# Patient Record
Sex: Female | Born: 1940 | Race: White | Hispanic: No | Marital: Married | Smoking: Former smoker
Health system: Southern US, Community
[De-identification: ages and names within clinical notes are randomized; demographics above are authoritative.]

## PROBLEM LIST (undated history)

## (undated) DIAGNOSIS — E041 Nontoxic single thyroid nodule: Secondary | ICD-10-CM

## (undated) DIAGNOSIS — E7800 Pure hypercholesterolemia, unspecified: Secondary | ICD-10-CM

## (undated) DIAGNOSIS — E669 Obesity, unspecified: Secondary | ICD-10-CM

## (undated) DIAGNOSIS — I2109 ST elevation (STEMI) myocardial infarction involving other coronary artery of anterior wall: Secondary | ICD-10-CM

## (undated) DIAGNOSIS — F32A Depression, unspecified: Secondary | ICD-10-CM

## (undated) DIAGNOSIS — I10 Essential (primary) hypertension: Secondary | ICD-10-CM

## (undated) DIAGNOSIS — I480 Paroxysmal atrial fibrillation: Secondary | ICD-10-CM

## (undated) DIAGNOSIS — F329 Major depressive disorder, single episode, unspecified: Secondary | ICD-10-CM

## (undated) DIAGNOSIS — I25810 Atherosclerosis of coronary artery bypass graft(s) without angina pectoris: Secondary | ICD-10-CM

## (undated) DIAGNOSIS — J984 Other disorders of lung: Secondary | ICD-10-CM

## (undated) DIAGNOSIS — Z950 Presence of cardiac pacemaker: Secondary | ICD-10-CM

## (undated) DIAGNOSIS — M479 Spondylosis, unspecified: Secondary | ICD-10-CM

## (undated) HISTORY — DX: Essential (primary) hypertension: I10

## (undated) HISTORY — DX: Spondylosis, unspecified: M47.9

## (undated) HISTORY — PX: CHOLECYSTECTOMY: SHX55

## (undated) HISTORY — DX: Paroxysmal atrial fibrillation: I48.0

## (undated) HISTORY — DX: Pure hypercholesterolemia, unspecified: E78.00

## (undated) HISTORY — PX: APPENDECTOMY: SHX54

## (undated) HISTORY — DX: Presence of cardiac pacemaker: Z95.0

## (undated) HISTORY — DX: Other disorders of lung: J98.4

## (undated) HISTORY — PX: TONSILLECTOMY: SHX5217

## (undated) HISTORY — DX: Obesity, unspecified: E66.9

## (undated) HISTORY — DX: Atherosclerosis of coronary artery bypass graft(s) without angina pectoris: I25.810

## (undated) HISTORY — DX: ST elevation (STEMI) myocardial infarction involving other coronary artery of anterior wall: I21.09

## (undated) HISTORY — PX: ANGIOPLASTY: SHX39

## (undated) HISTORY — DX: Nontoxic single thyroid nodule: E04.1

## (undated) HISTORY — DX: Major depressive disorder, single episode, unspecified: F32.9

## (undated) HISTORY — DX: Depression, unspecified: F32.A

---

## 1965-11-02 HISTORY — PX: BREAST LUMPECTOMY: SHX2

## 2005-11-23 ENCOUNTER — Other Ambulatory Visit: Admission: RE | Admit: 2005-11-23 | Discharge: 2005-11-23 | Payer: Self-pay | Admitting: Family Medicine

## 2009-11-02 DIAGNOSIS — E041 Nontoxic single thyroid nodule: Secondary | ICD-10-CM

## 2009-11-02 DIAGNOSIS — I2109 ST elevation (STEMI) myocardial infarction involving other coronary artery of anterior wall: Secondary | ICD-10-CM

## 2009-11-02 DIAGNOSIS — I25810 Atherosclerosis of coronary artery bypass graft(s) without angina pectoris: Secondary | ICD-10-CM

## 2009-11-02 HISTORY — PX: CORONARY ARTERY BYPASS GRAFT: SHX141

## 2009-11-02 HISTORY — DX: Atherosclerosis of coronary artery bypass graft(s) without angina pectoris: I25.810

## 2009-11-02 HISTORY — DX: Nontoxic single thyroid nodule: E04.1

## 2009-11-02 HISTORY — DX: ST elevation (STEMI) myocardial infarction involving other coronary artery of anterior wall: I21.09

## 2009-11-03 ENCOUNTER — Ambulatory Visit: Payer: Self-pay | Admitting: Cardiology

## 2009-11-03 ENCOUNTER — Inpatient Hospital Stay (HOSPITAL_COMMUNITY): Admission: EM | Admit: 2009-11-03 | Discharge: 2009-11-14 | Payer: Self-pay | Admitting: Emergency Medicine

## 2009-11-03 ENCOUNTER — Encounter: Payer: Self-pay | Admitting: Cardiology

## 2009-11-04 ENCOUNTER — Encounter: Payer: Self-pay | Admitting: Cardiology

## 2009-11-05 ENCOUNTER — Encounter: Payer: Self-pay | Admitting: Thoracic Surgery (Cardiothoracic Vascular Surgery)

## 2009-11-05 ENCOUNTER — Encounter: Payer: Self-pay | Admitting: Cardiology

## 2009-11-05 ENCOUNTER — Ambulatory Visit: Payer: Self-pay | Admitting: Thoracic Surgery (Cardiothoracic Vascular Surgery)

## 2009-11-06 ENCOUNTER — Encounter: Payer: Self-pay | Admitting: Cardiology

## 2009-11-12 ENCOUNTER — Encounter: Payer: Self-pay | Admitting: Cardiology

## 2009-11-16 ENCOUNTER — Inpatient Hospital Stay (HOSPITAL_COMMUNITY): Admission: EM | Admit: 2009-11-16 | Discharge: 2009-11-17 | Payer: Self-pay | Admitting: Emergency Medicine

## 2009-11-16 ENCOUNTER — Ambulatory Visit: Payer: Self-pay | Admitting: Internal Medicine

## 2009-11-20 ENCOUNTER — Ambulatory Visit: Payer: Self-pay | Admitting: Cardiovascular Disease

## 2009-11-20 LAB — CONVERTED CEMR LAB: POC INR: 2.2

## 2009-11-25 ENCOUNTER — Ambulatory Visit: Payer: Self-pay | Admitting: Internal Medicine

## 2009-12-02 ENCOUNTER — Ambulatory Visit: Payer: Self-pay | Admitting: Thoracic Surgery (Cardiothoracic Vascular Surgery)

## 2009-12-02 ENCOUNTER — Encounter
Admission: RE | Admit: 2009-12-02 | Discharge: 2009-12-02 | Payer: Self-pay | Admitting: Thoracic Surgery (Cardiothoracic Vascular Surgery)

## 2009-12-02 ENCOUNTER — Ambulatory Visit: Payer: Self-pay | Admitting: Cardiology

## 2009-12-02 LAB — CONVERTED CEMR LAB: POC INR: 3

## 2009-12-05 DIAGNOSIS — I4891 Unspecified atrial fibrillation: Secondary | ICD-10-CM | POA: Insufficient documentation

## 2009-12-05 DIAGNOSIS — E669 Obesity, unspecified: Secondary | ICD-10-CM

## 2009-12-05 DIAGNOSIS — I2109 ST elevation (STEMI) myocardial infarction involving other coronary artery of anterior wall: Secondary | ICD-10-CM

## 2009-12-05 DIAGNOSIS — I10 Essential (primary) hypertension: Secondary | ICD-10-CM

## 2009-12-05 DIAGNOSIS — I25810 Atherosclerosis of coronary artery bypass graft(s) without angina pectoris: Secondary | ICD-10-CM | POA: Insufficient documentation

## 2009-12-06 ENCOUNTER — Telehealth: Payer: Self-pay | Admitting: Cardiology

## 2009-12-11 ENCOUNTER — Ambulatory Visit: Payer: Self-pay | Admitting: Cardiology

## 2009-12-11 ENCOUNTER — Encounter: Payer: Self-pay | Admitting: Cardiology

## 2009-12-15 LAB — CONVERTED CEMR LAB
BUN: 19 mg/dL (ref 6–23)
Basophils Relative: 0.7 % (ref 0.0–3.0)
Cholesterol: 124 mg/dL (ref 0–200)
Eosinophils Relative: 2.5 % (ref 0.0–5.0)
HCT: 37.7 % (ref 36.0–46.0)
HDL: 54.9 mg/dL (ref >39.00)
Hemoglobin: 12.4 g/dL (ref 12.0–15.0)
Lymphocytes Relative: 33.6 % (ref 12.0–46.0)
MCHC: 32.8 g/dL (ref 30.0–36.0)
MCV: 91.1 fL (ref 78.0–100.0)
Monocytes Absolute: 0.8 10*3/uL (ref 0.1–1.0)
Platelets: 308 10*3/uL (ref 150.0–400.0)
RBC: 4.13 M/uL (ref 3.87–5.11)
RDW: 13.1 % (ref 11.5–14.6)
Sodium: 138 meq/L (ref 135–145)
VLDL: 17.4 mg/dL (ref 0.0–40.0)

## 2009-12-16 ENCOUNTER — Encounter (INDEPENDENT_AMBULATORY_CARE_PROVIDER_SITE_OTHER): Payer: Self-pay

## 2009-12-26 ENCOUNTER — Ambulatory Visit: Payer: Self-pay | Admitting: Cardiology

## 2009-12-26 DIAGNOSIS — E7800 Pure hypercholesterolemia, unspecified: Secondary | ICD-10-CM

## 2010-01-02 ENCOUNTER — Encounter (HOSPITAL_COMMUNITY): Admission: RE | Admit: 2010-01-02 | Discharge: 2010-04-02 | Payer: Self-pay | Admitting: Cardiology

## 2010-01-05 ENCOUNTER — Ambulatory Visit: Payer: Self-pay | Admitting: Internal Medicine

## 2010-01-05 ENCOUNTER — Inpatient Hospital Stay (HOSPITAL_COMMUNITY): Admission: EM | Admit: 2010-01-05 | Discharge: 2010-01-06 | Payer: Self-pay | Admitting: Emergency Medicine

## 2010-01-06 ENCOUNTER — Encounter: Payer: Self-pay | Admitting: Internal Medicine

## 2010-02-03 ENCOUNTER — Ambulatory Visit: Payer: Self-pay | Admitting: Cardiology

## 2010-02-05 ENCOUNTER — Ambulatory Visit: Payer: Self-pay | Admitting: Cardiology

## 2010-02-05 DIAGNOSIS — J984 Other disorders of lung: Secondary | ICD-10-CM

## 2010-02-06 ENCOUNTER — Ambulatory Visit (HOSPITAL_COMMUNITY): Admission: RE | Admit: 2010-02-06 | Discharge: 2010-02-06 | Payer: Self-pay | Admitting: Cardiology

## 2010-02-18 ENCOUNTER — Encounter: Payer: Self-pay | Admitting: Cardiology

## 2010-02-20 ENCOUNTER — Ambulatory Visit: Payer: Self-pay | Admitting: Internal Medicine

## 2010-02-20 DIAGNOSIS — F329 Major depressive disorder, single episode, unspecified: Secondary | ICD-10-CM

## 2010-02-20 DIAGNOSIS — N390 Urinary tract infection, site not specified: Secondary | ICD-10-CM | POA: Insufficient documentation

## 2010-02-20 DIAGNOSIS — I2510 Atherosclerotic heart disease of native coronary artery without angina pectoris: Secondary | ICD-10-CM | POA: Insufficient documentation

## 2010-02-20 DIAGNOSIS — R509 Fever, unspecified: Secondary | ICD-10-CM

## 2010-02-20 LAB — CONVERTED CEMR LAB
Basophils Relative: 0.2 % (ref 0.0–3.0)
Eosinophils Absolute: 0 10*3/uL (ref 0.0–0.7)
HCT: 39.9 % (ref 36.0–46.0)
Ketones, ur: 40 mg/dL
MCHC: 33.6 g/dL (ref 30.0–36.0)
MCV: 84.5 fL (ref 78.0–100.0)
Monocytes Absolute: 0.9 10*3/uL (ref 0.1–1.0)
Neutrophils Relative %: 78.7 % — ABNORMAL HIGH (ref 43.0–77.0)
Platelets: 323 10*3/uL (ref 150.0–400.0)
RBC: 4.72 M/uL (ref 3.87–5.11)
pH: 5.5 (ref 5.0–8.0)

## 2010-02-24 ENCOUNTER — Encounter: Payer: Self-pay | Admitting: Internal Medicine

## 2010-02-25 LAB — CONVERTED CEMR LAB
Alkaline Phosphatase: 86 units/L (ref 39–117)
Bilirubin, Direct: 0 mg/dL (ref 0.0–0.3)
Cholesterol: 140 mg/dL (ref 0–200)
Total CHOL/HDL Ratio: 3
Triglycerides: 81 mg/dL (ref 0.0–149.0)

## 2010-03-05 ENCOUNTER — Encounter: Payer: Self-pay | Admitting: Cardiology

## 2010-03-05 ENCOUNTER — Ambulatory Visit: Payer: Self-pay | Admitting: Cardiology

## 2010-03-05 DIAGNOSIS — R0602 Shortness of breath: Secondary | ICD-10-CM

## 2010-03-06 ENCOUNTER — Ambulatory Visit: Payer: Self-pay | Admitting: Endocrinology

## 2010-03-06 DIAGNOSIS — E042 Nontoxic multinodular goiter: Secondary | ICD-10-CM

## 2010-03-11 ENCOUNTER — Telehealth: Payer: Self-pay | Admitting: Endocrinology

## 2010-03-11 ENCOUNTER — Ambulatory Visit: Payer: Self-pay | Admitting: Internal Medicine

## 2010-03-11 DIAGNOSIS — L919 Hypertrophic disorder of the skin, unspecified: Secondary | ICD-10-CM

## 2010-03-11 DIAGNOSIS — L909 Atrophic disorder of skin, unspecified: Secondary | ICD-10-CM | POA: Insufficient documentation

## 2010-03-13 ENCOUNTER — Ambulatory Visit: Payer: Self-pay | Admitting: Cardiology

## 2010-03-17 ENCOUNTER — Telehealth: Payer: Self-pay | Admitting: Cardiology

## 2010-03-18 ENCOUNTER — Encounter: Payer: Self-pay | Admitting: Endocrinology

## 2010-03-18 ENCOUNTER — Other Ambulatory Visit: Admission: RE | Admit: 2010-03-18 | Discharge: 2010-03-18 | Payer: Self-pay | Admitting: Interventional Radiology

## 2010-03-18 ENCOUNTER — Encounter: Payer: Self-pay | Admitting: Cardiology

## 2010-03-18 ENCOUNTER — Encounter: Admission: RE | Admit: 2010-03-18 | Discharge: 2010-03-18 | Payer: Self-pay | Admitting: Endocrinology

## 2010-04-02 ENCOUNTER — Ambulatory Visit: Payer: Self-pay | Admitting: Internal Medicine

## 2010-04-02 ENCOUNTER — Telehealth: Payer: Self-pay | Admitting: Internal Medicine

## 2010-04-02 ENCOUNTER — Ambulatory Visit: Payer: Self-pay | Admitting: Cardiology

## 2010-04-02 ENCOUNTER — Inpatient Hospital Stay (HOSPITAL_COMMUNITY): Admission: EM | Admit: 2010-04-02 | Discharge: 2010-04-04 | Payer: Self-pay | Admitting: Emergency Medicine

## 2010-04-02 ENCOUNTER — Emergency Department (HOSPITAL_COMMUNITY): Admission: EM | Admit: 2010-04-02 | Discharge: 2010-04-02 | Payer: Self-pay | Admitting: Family Medicine

## 2010-04-02 DIAGNOSIS — M542 Cervicalgia: Secondary | ICD-10-CM

## 2010-04-02 DIAGNOSIS — Z950 Presence of cardiac pacemaker: Secondary | ICD-10-CM

## 2010-04-02 DIAGNOSIS — R079 Chest pain, unspecified: Secondary | ICD-10-CM

## 2010-04-02 HISTORY — DX: Presence of cardiac pacemaker: Z95.0

## 2010-04-02 LAB — CONVERTED CEMR LAB
Basophils Relative: 0.3 % (ref 0.0–3.0)
Eosinophils Relative: 0.3 % (ref 0.0–5.0)
Hemoglobin: 13.6 g/dL (ref 12.0–15.0)
Lymphocytes Relative: 26.1 % (ref 12.0–46.0)
MCHC: 34 g/dL (ref 30.0–36.0)
Monocytes Absolute: 0.9 10*3/uL (ref 0.1–1.0)
Neutro Abs: 6 10*3/uL (ref 1.4–7.7)
Platelets: 302 10*3/uL (ref 150.0–400.0)
RBC: 4.76 M/uL (ref 3.87–5.11)
RDW: 16.5 % — ABNORMAL HIGH (ref 11.5–14.6)
Total CK: 28 units/L (ref 7–177)

## 2010-04-03 ENCOUNTER — Encounter (HOSPITAL_COMMUNITY)
Admission: RE | Admit: 2010-04-03 | Discharge: 2010-07-02 | Payer: Self-pay | Source: Home / Self Care | Admitting: Cardiology

## 2010-04-03 ENCOUNTER — Encounter: Payer: Self-pay | Admitting: Internal Medicine

## 2010-04-04 ENCOUNTER — Encounter: Payer: Self-pay | Admitting: Internal Medicine

## 2010-04-07 ENCOUNTER — Ambulatory Visit: Payer: Self-pay | Admitting: Cardiology

## 2010-04-07 ENCOUNTER — Encounter: Payer: Self-pay | Admitting: Internal Medicine

## 2010-04-10 ENCOUNTER — Telehealth: Payer: Self-pay | Admitting: Internal Medicine

## 2010-04-14 ENCOUNTER — Encounter: Payer: Self-pay | Admitting: Internal Medicine

## 2010-04-14 ENCOUNTER — Ambulatory Visit: Payer: Self-pay

## 2010-04-14 ENCOUNTER — Ambulatory Visit: Payer: Self-pay | Admitting: Cardiovascular Disease

## 2010-04-16 ENCOUNTER — Ambulatory Visit: Payer: Self-pay | Admitting: Internal Medicine

## 2010-04-16 ENCOUNTER — Telehealth: Payer: Self-pay | Admitting: Cardiology

## 2010-04-16 DIAGNOSIS — M479 Spondylosis, unspecified: Secondary | ICD-10-CM | POA: Insufficient documentation

## 2010-04-16 DIAGNOSIS — R700 Elevated erythrocyte sedimentation rate: Secondary | ICD-10-CM

## 2010-04-22 ENCOUNTER — Encounter: Payer: Self-pay | Admitting: Cardiology

## 2010-04-28 ENCOUNTER — Ambulatory Visit: Payer: Self-pay | Admitting: Cardiology

## 2010-04-29 LAB — CONVERTED CEMR LAB
BUN: 24 mg/dL — ABNORMAL HIGH (ref 6–23)
Calcium: 9.7 mg/dL (ref 8.4–10.5)
GFR calc non Af Amer: 73.9 mL/min (ref >60)
Potassium: 4.5 meq/L (ref 3.5–5.1)
Sodium: 142 meq/L (ref 135–145)

## 2010-04-30 ENCOUNTER — Encounter: Payer: Self-pay | Admitting: Internal Medicine

## 2010-05-12 ENCOUNTER — Encounter: Payer: Self-pay | Admitting: Cardiology

## 2010-05-26 ENCOUNTER — Telehealth: Payer: Self-pay | Admitting: Cardiology

## 2010-06-02 IMAGING — CR DG CHEST 2V
2 series · 2 of 2 positions shown · non-contrast
Comparison: 12/02/2009

CLINICAL DATA: Chest pain, pain with breathing, history of smoking
and hypertension, question pleural effusion

CHEST - 2 VIEW

[view not recorded (1 of 2)]
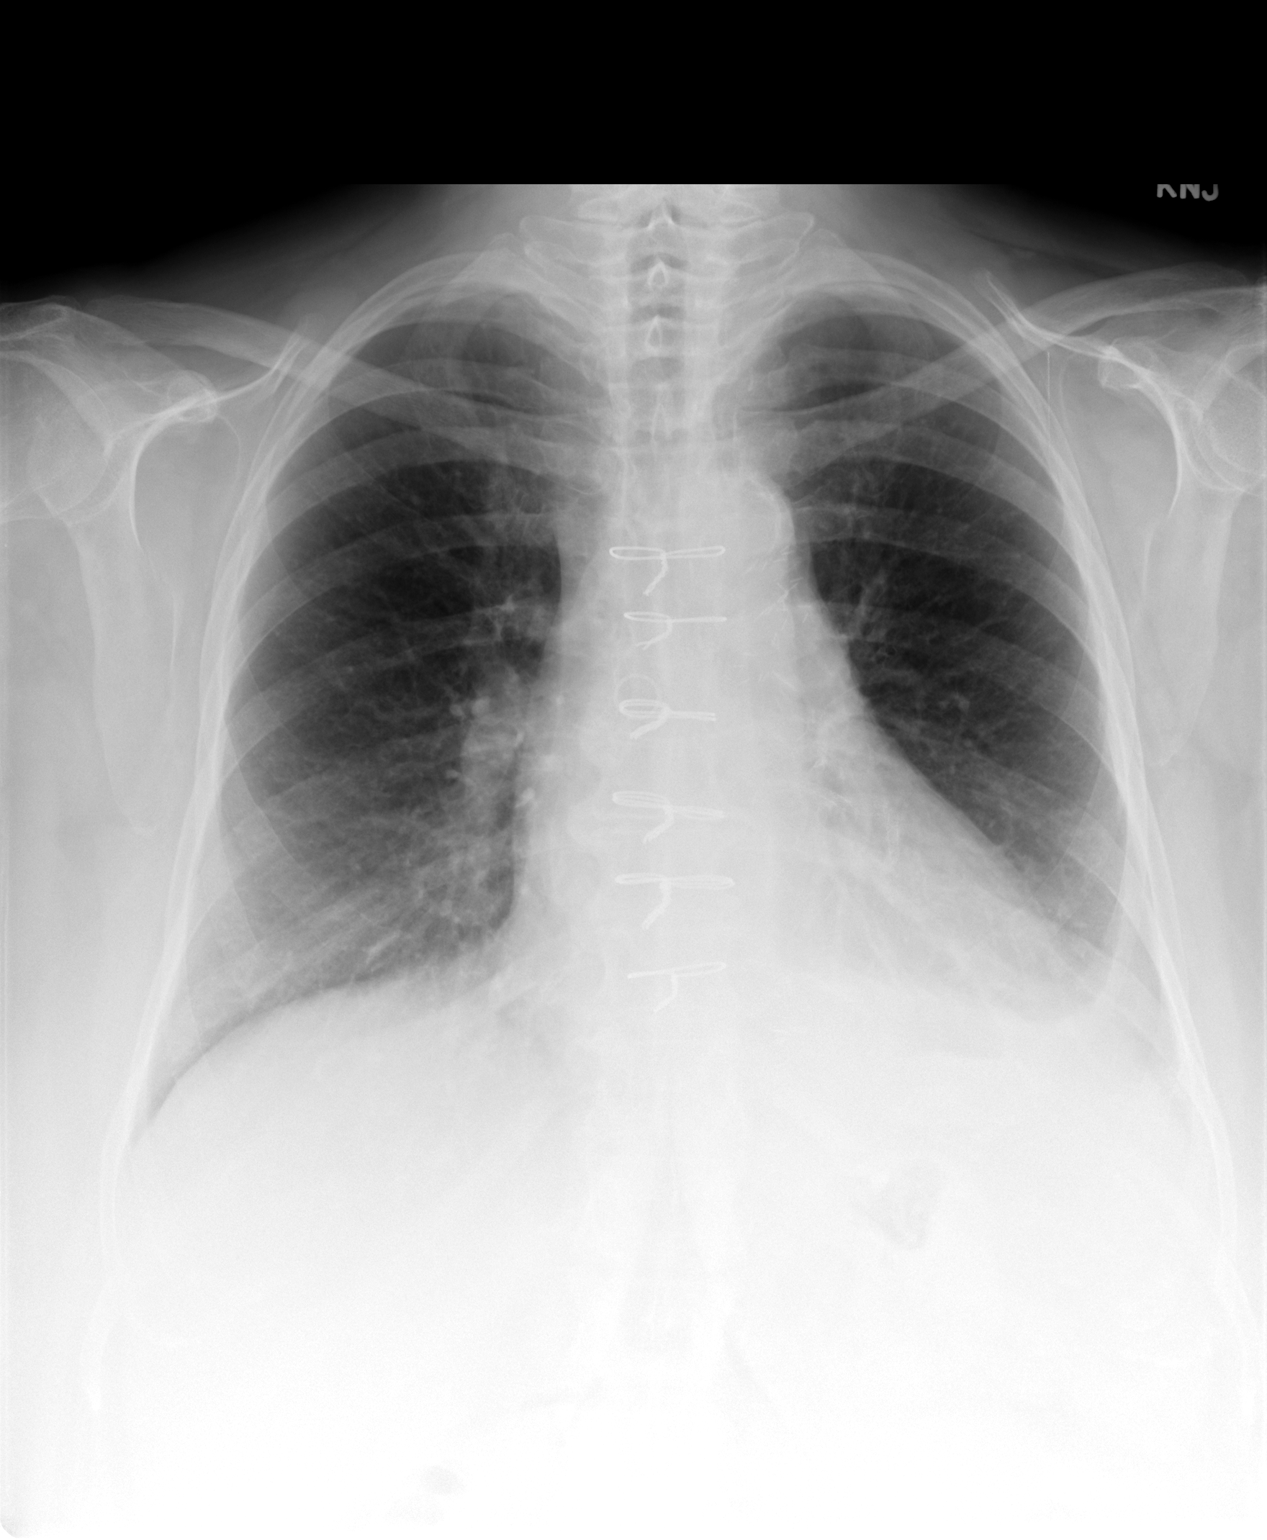

[view not recorded (2 of 2)]
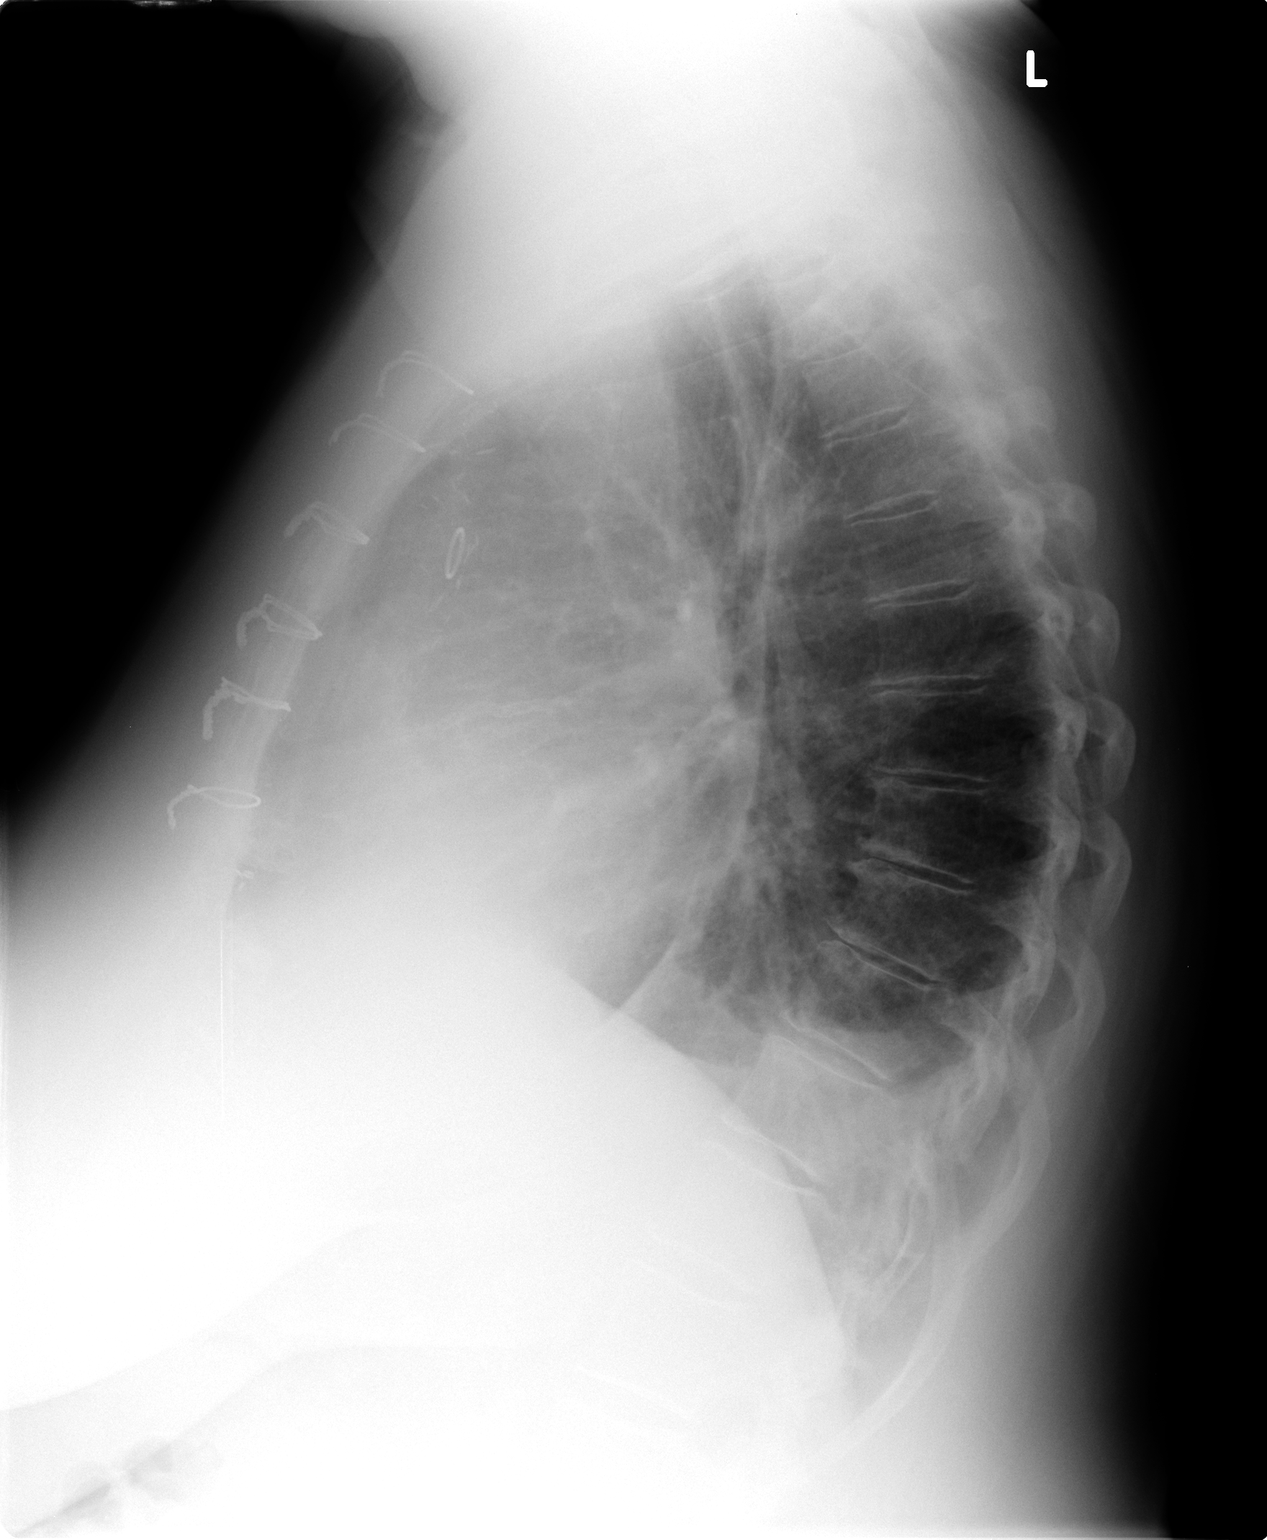

[2 of 2 positions shown; findings below may reference images not displayed]

FINDINGS: Mild cardiac enlargement status post CABG.
Atherosclerotic calcifications aorta.
Pulmonary vascularity normal.
Small left pleural effusion with left basilar atelectasis.
No definite acute infiltrate or pneumothorax.
Bones appear mildly demineralized.
IMPRESSION: Mild cardiac enlargement status post CABG.
Small persistent left pleural effusion with mild associated left
basilar atelectasis.

## 2010-07-08 ENCOUNTER — Ambulatory Visit: Payer: Self-pay | Admitting: Internal Medicine

## 2010-07-08 DIAGNOSIS — Z950 Presence of cardiac pacemaker: Secondary | ICD-10-CM

## 2010-07-13 IMAGING — CR DG CHEST 1V PORT
1 series · 1 of 1 positions shown · non-contrast
Comparison: 03/05/2010

CLINICAL DATA: Chest pain

PORTABLE CHEST - 1 VIEW

[AP]
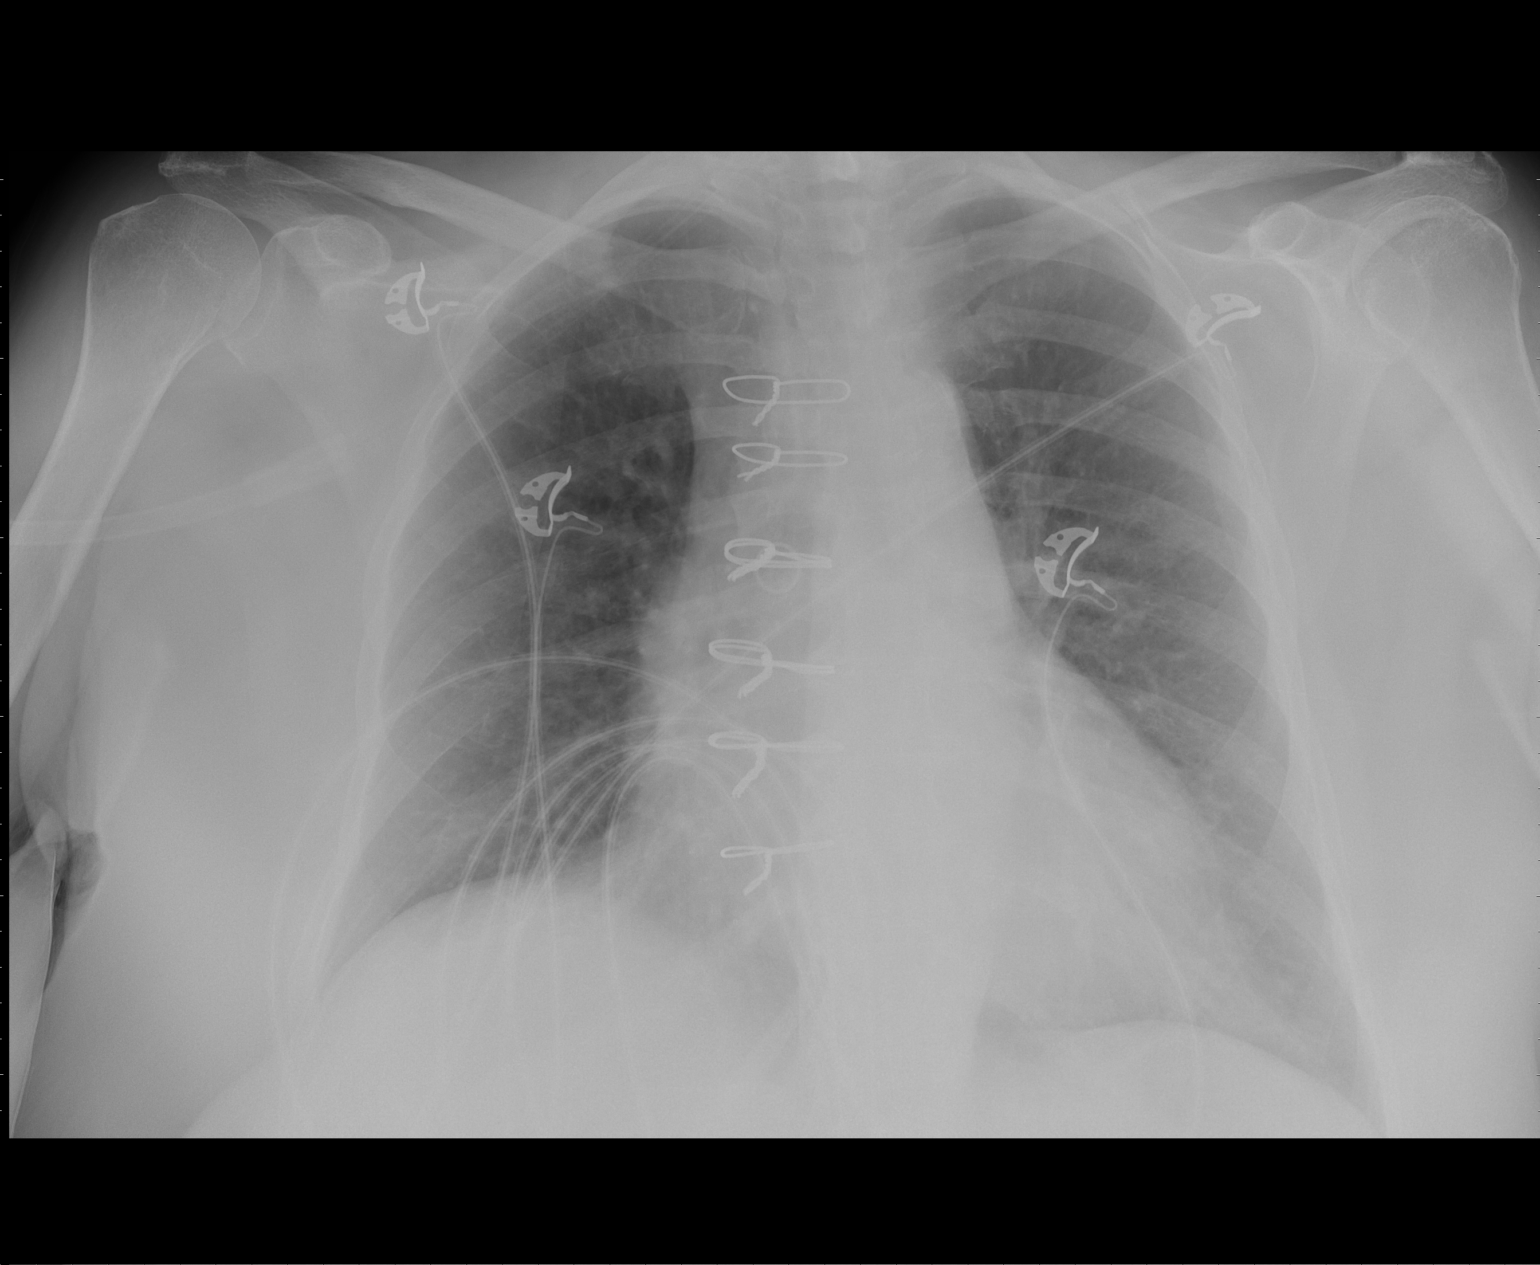

[1 of 1 positions shown; findings below may reference images not displayed]

FINDINGS: Post CABG.  Heart size within normal limits for AP
projection.  Lungs clear.  No pleural fluid in one-view.
IMPRESSION: Post CABG - no active disease.

## 2010-07-15 ENCOUNTER — Encounter: Payer: Self-pay | Admitting: Internal Medicine

## 2010-07-15 ENCOUNTER — Ambulatory Visit: Payer: Self-pay | Admitting: Cardiology

## 2010-07-15 IMAGING — CR DG CHEST 2V
2 series · 2 of 2 positions shown · non-contrast
Comparison: None.

CLINICAL DATA: Acute coronary syndrome.  Status post pacer.

CHEST - 2 VIEW

[w chest pa]
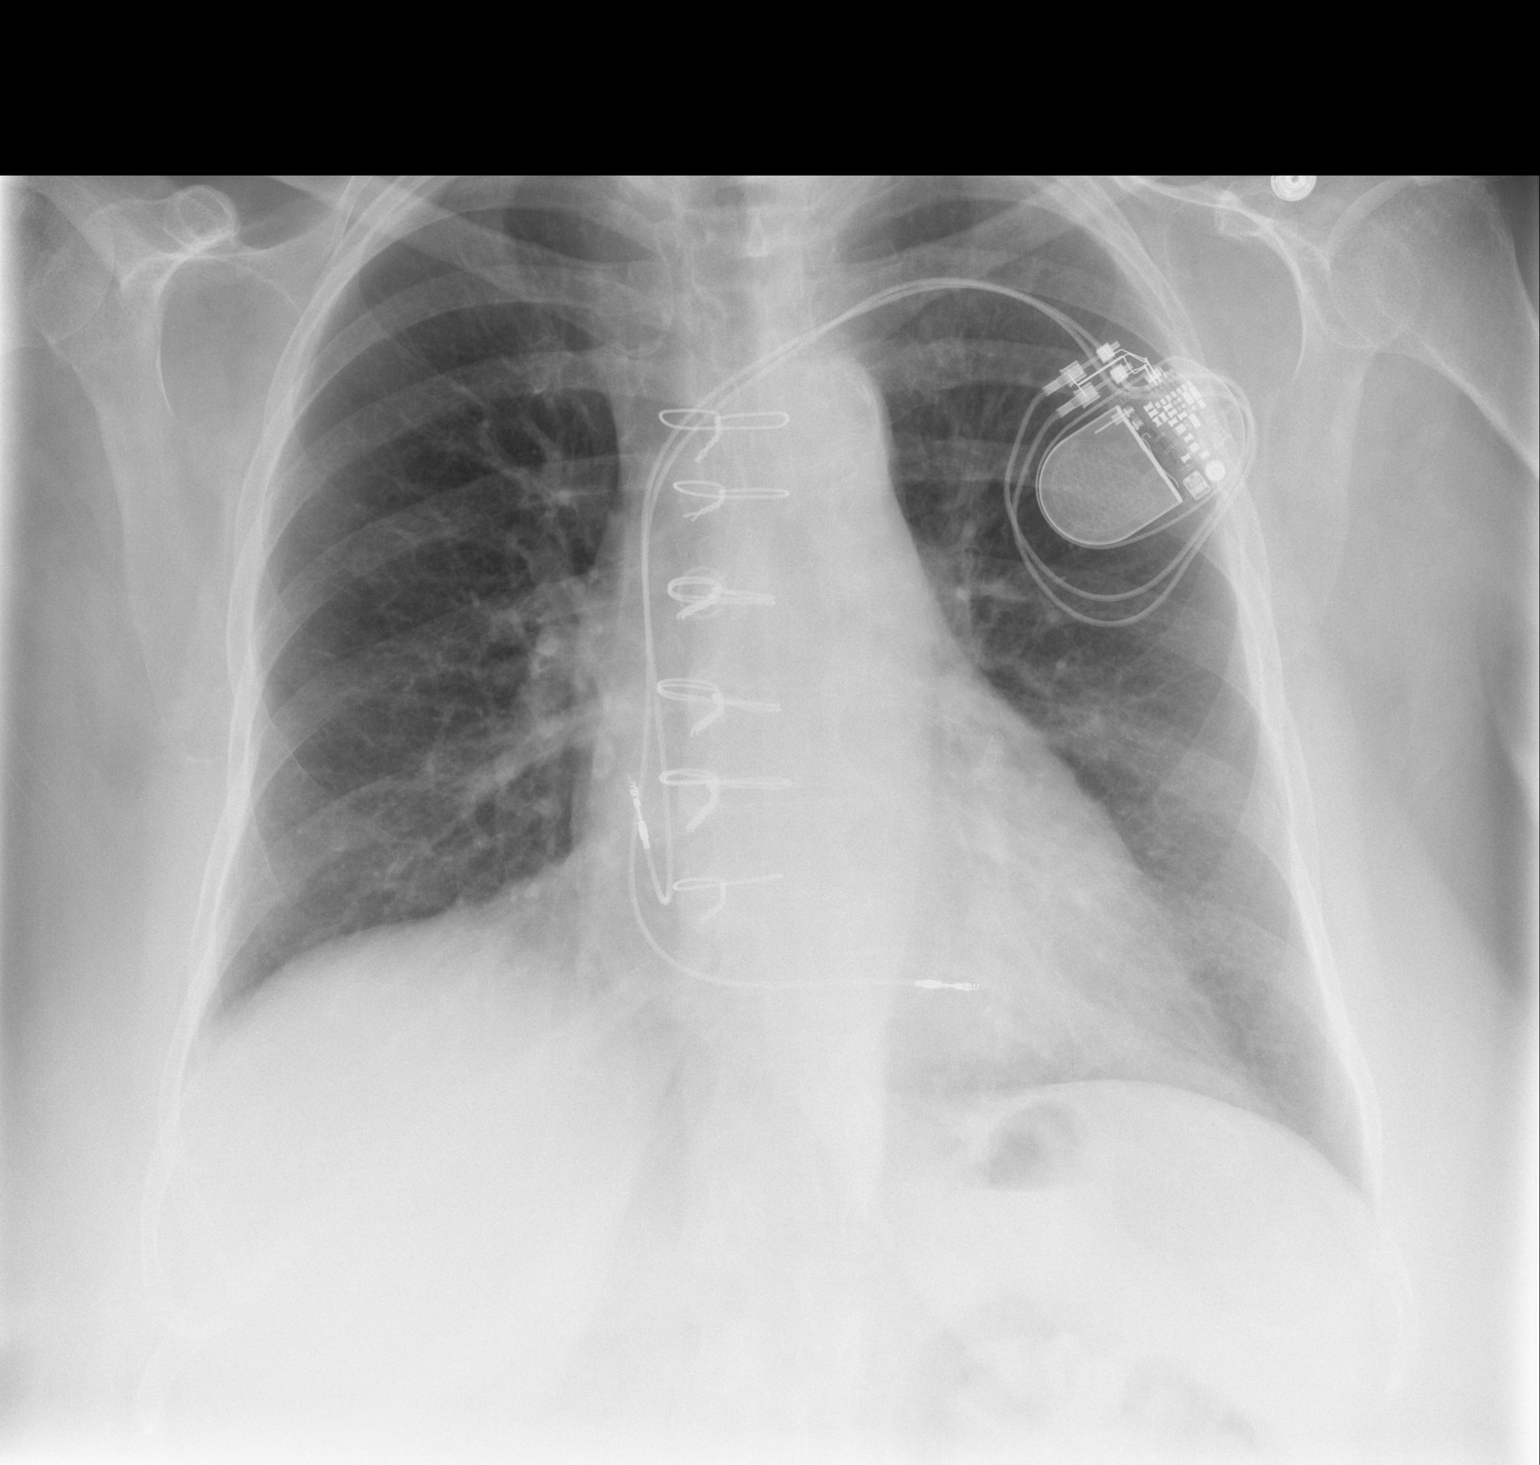

[w chest lat]
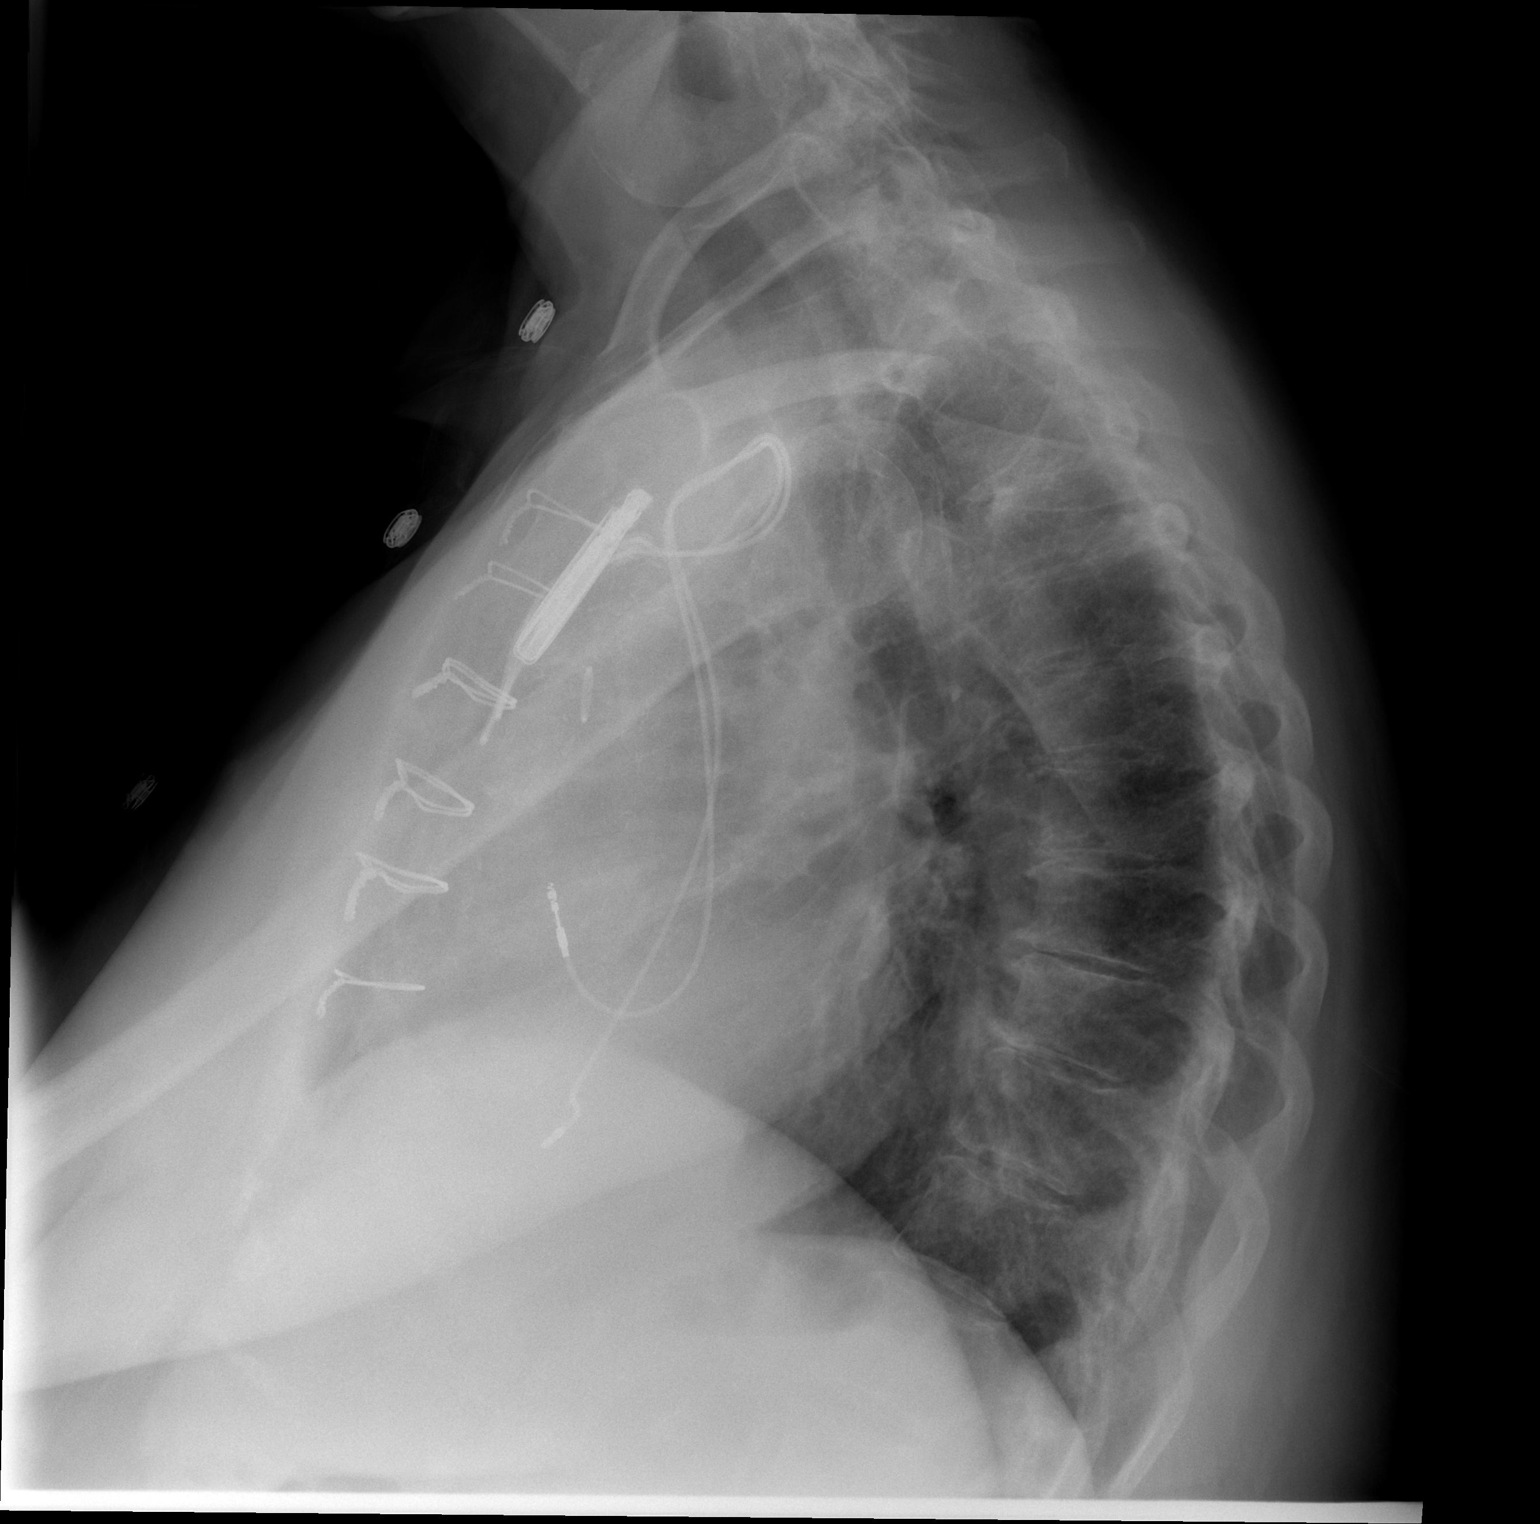

[2 of 2 positions shown; findings below may reference images not displayed]

FINDINGS: Noted from right ventricular pacer leads appear in
satisfactory position.  Generator pack is noted in the left
pectoral region.  No pneumothorax.  Cardiomediastinal silhouette
unremarkable.  No vascular congestion.
IMPRESSION: Radiographically, satisfactory placement of pacer leads.  No acute
cardiopulmonary disease.

## 2010-09-19 ENCOUNTER — Ambulatory Visit: Payer: Self-pay | Admitting: Internal Medicine

## 2010-09-30 ENCOUNTER — Ambulatory Visit: Payer: Self-pay | Admitting: Endocrinology

## 2010-10-06 ENCOUNTER — Encounter: Admission: RE | Admit: 2010-10-06 | Discharge: 2010-10-06 | Payer: Self-pay | Admitting: Endocrinology

## 2010-10-06 LAB — CONVERTED CEMR LAB: TSH: 1.92 microintl units/mL (ref 0.35–5.50)

## 2010-10-07 ENCOUNTER — Encounter: Payer: Self-pay | Admitting: Internal Medicine

## 2010-10-14 ENCOUNTER — Encounter: Payer: Self-pay | Admitting: Internal Medicine

## 2010-10-14 ENCOUNTER — Encounter: Payer: Self-pay | Admitting: Cardiology

## 2010-10-14 ENCOUNTER — Ambulatory Visit: Payer: Self-pay | Admitting: Cardiology

## 2010-10-14 DIAGNOSIS — R002 Palpitations: Secondary | ICD-10-CM

## 2010-10-15 ENCOUNTER — Ambulatory Visit: Payer: Self-pay

## 2010-10-15 ENCOUNTER — Encounter: Payer: Self-pay | Admitting: Cardiology

## 2010-10-15 LAB — CONVERTED CEMR LAB: Total CK: 53 units/L (ref 7–177)

## 2010-10-17 ENCOUNTER — Ambulatory Visit: Payer: Self-pay | Admitting: Internal Medicine

## 2010-10-17 ENCOUNTER — Telehealth: Payer: Self-pay | Admitting: Internal Medicine

## 2010-10-17 LAB — CONVERTED CEMR LAB: BUN: 23 mg/dL (ref 6–23)

## 2010-10-20 ENCOUNTER — Ambulatory Visit: Payer: Self-pay | Admitting: Internal Medicine

## 2010-11-18 ENCOUNTER — Other Ambulatory Visit: Payer: Self-pay | Admitting: Cardiology

## 2010-11-18 ENCOUNTER — Ambulatory Visit
Admission: RE | Admit: 2010-11-18 | Discharge: 2010-11-18 | Payer: Self-pay | Source: Home / Self Care | Attending: Cardiology | Admitting: Cardiology

## 2010-11-19 LAB — CBC WITH DIFFERENTIAL/PLATELET
Basophils Absolute: 0 10*3/uL (ref 0.0–0.1)
Basophils Relative: 0.3 % (ref 0.0–3.0)
Eosinophils Absolute: 0.1 10*3/uL (ref 0.0–0.7)
Eosinophils Relative: 1.5 % (ref 0.0–5.0)
HCT: 40.6 % (ref 36.0–46.0)
Hemoglobin: 13.7 g/dL (ref 12.0–15.0)
Lymphocytes Relative: 45.9 % (ref 12.0–46.0)
Lymphs Abs: 2.6 10*3/uL (ref 0.7–4.0)
MCHC: 33.8 g/dL (ref 30.0–36.0)
MCV: 93.4 fl (ref 78.0–100.0)
Monocytes Absolute: 0.4 10*3/uL (ref 0.1–1.0)
Monocytes Relative: 6.6 % (ref 3.0–12.0)
Neutro Abs: 2.6 10*3/uL (ref 1.4–7.7)
Neutrophils Relative %: 45.7 % (ref 43.0–77.0)
Platelets: 263 10*3/uL (ref 150.0–400.0)
RBC: 4.35 Mil/uL (ref 3.87–5.11)
RDW: 13 % (ref 11.5–14.6)
WBC: 5.7 10*3/uL (ref 4.5–10.5)

## 2010-11-20 ENCOUNTER — Encounter: Payer: Self-pay | Admitting: Internal Medicine

## 2010-11-25 ENCOUNTER — Ambulatory Visit: Admission: RE | Admit: 2010-11-25 | Discharge: 2010-11-25 | Payer: Self-pay | Source: Home / Self Care

## 2010-12-02 ENCOUNTER — Ambulatory Visit: Admission: RE | Admit: 2010-12-02 | Discharge: 2010-12-02 | Payer: Self-pay | Source: Home / Self Care

## 2010-12-02 LAB — CONVERTED CEMR LAB: POC INR: 1.6

## 2010-12-02 NOTE — Assessment & Plan Note (Signed)
Summary: F/U HOLTER MONITOR   Visit Type:  Follow-up Primary Provider:  No PCP at times  CC:  Follow up holter monitor.  History of Present Illness: Overall doing well.  Legs bother her a bit, similar to Lipitor.  Lipid numbers as noted in letter.  When she walks, on occasion her legs bother her.  BP is better controlled at home.  Feels great.  Monitor unremarkable.  Current Medications (verified): 1)  Metoprolol Tartrate 25 Mg Tabs (Metoprolol Tartrate) .... Take 1 1/2 Tablet By Mouth Twice A Day 2)  Nitrostat 0.4 Mg Subl (Nitroglycerin) .Marland Kitchen.. 1 Tablet Under Tongue At Onset of Chest Pain; You May Repeat Every 5 Minutes For Up To 3 Doses. 3)  Acetaminophen 500 Mg Tabs (Acetaminophen) .... Take Two Times A Day As Needed 4)  Aspirin Ec 325 Mg Tbec (Aspirin) .... Take One Tablet By Mouth Daily 5)  Plavix 75 Mg Tabs (Clopidogrel Bisulfate) .... Take 1 Tablet By Mouth Once A Day 6)  Amlodipine Besylate 5 Mg Tabs (Amlodipine Besylate) .... Take One Tablet By Mouth Daily 7)  Crestor 20 Mg Tabs (Rosuvastatin Calcium) .... Take 1 Tab By Mouth At Bedtime 8)  Lisinopril 20 Mg Tabs (Lisinopril) .... Take One Tablet By Mouth Daily  Allergies (verified): No Known Drug Allergies  Vital Signs:  Patient profile:   70 year old female Height:      63.5 inches Weight:      213.25 pounds BMI:     37.32 Pulse rate:   65 / minute Pulse rhythm:   regular Resp:     18 per minute BP sitting:   149 / 58  (left arm) Cuff size:   large  Vitals Entered By: Vikki Ports (December 26, 2009 9:05 AM)  Physical Exam  General:  Well developed, well nourished, in no acute distress. Head:  normocephalic and atraumatic Lungs:  Clear bilaterally to auscultation and percussion. Heart:  PMI non displaced.  Normal S1 and S2.  No murmur. Pulses:  pulses normal in all 4 extremities Extremities:  No clubbing or cyanosis. Neurologic:  Alert and oriented x 3.   Impression & Recommendations:  Problem # 1:  CAD,  ARTERY BYPASS GRAFT (ICD-414.04)  No recurrent symptoms.   Her updated medication list for this problem includes:    Metoprolol Tartrate 25 Mg Tabs (Metoprolol tartrate) .Marland Kitchen... Take 1 1/2 tablet by mouth twice a day    Nitrostat 0.4 Mg Subl (Nitroglycerin) .Marland Kitchen... 1 tablet under tongue at onset of chest pain; you may repeat every 5 minutes for up to 3 doses.    Aspirin Ec 325 Mg Tbec (Aspirin) .Marland Kitchen... Take one tablet by mouth daily    Plavix 75 Mg Tabs (Clopidogrel bisulfate) .Marland Kitchen... Take 1 tablet by mouth once a day    Amlodipine Besylate 5 Mg Tabs (Amlodipine besylate) .Marland Kitchen... Take one tablet by mouth daily    Lisinopril 20 Mg Tabs (Lisinopril) .Marland Kitchen... Take one tablet by mouth daily  Her updated medication list for this problem includes:    Metoprolol Tartrate 25 Mg Tabs (Metoprolol tartrate) .Marland Kitchen... Take 1 1/2 tablet by mouth twice a day    Nitrostat 0.4 Mg Subl (Nitroglycerin) .Marland Kitchen... 1 tablet under tongue at onset of chest pain; you may repeat every 5 minutes for up to 3 doses.    Aspirin Ec 325 Mg Tbec (Aspirin) .Marland Kitchen... Take one tablet by mouth daily    Plavix 75 Mg Tabs (Clopidogrel bisulfate) .Marland Kitchen... Take 1 tablet by mouth once a day  Amlodipine Besylate 5 Mg Tabs (Amlodipine besylate) .Marland Kitchen... Take one tablet by mouth daily    Lisinopril 20 Mg Tabs (Lisinopril) .Marland Kitchen... Take one tablet by mouth daily  Problem # 2:  PAROXYSMAL ATRIAL FIBRILLATION (ICD-427.31)  No obvious recurrence.  Now off warfarin. Her updated medication list for this problem includes:    Metoprolol Tartrate 25 Mg Tabs (Metoprolol tartrate) .Marland Kitchen... Take 1 1/2 tablet by mouth twice a day    Aspirin Ec 325 Mg Tbec (Aspirin) .Marland Kitchen... Take one tablet by mouth daily    Plavix 75 Mg Tabs (Clopidogrel bisulfate) .Marland Kitchen... Take 1 tablet by mouth once a day  Her updated medication list for this problem includes:    Metoprolol Tartrate 25 Mg Tabs (Metoprolol tartrate) .Marland Kitchen... Take 1 1/2 tablet by mouth twice a day    Aspirin Ec 325 Mg Tbec (Aspirin)  .Marland Kitchen... Take one tablet by mouth daily    Plavix 75 Mg Tabs (Clopidogrel bisulfate) .Marland Kitchen... Take 1 tablet by mouth once a day  Problem # 3:  HYPERTENSION, UNSPECIFIED (ICD-401.9)  Better control. Her updated medication list for this problem includes:    Metoprolol Tartrate 25 Mg Tabs (Metoprolol tartrate) .Marland Kitchen... Take 1 1/2 tablet by mouth twice a day    Aspirin Ec 325 Mg Tbec (Aspirin) .Marland Kitchen... Take one tablet by mouth daily    Amlodipine Besylate 5 Mg Tabs (Amlodipine besylate) .Marland Kitchen... Take one tablet by mouth daily    Lisinopril 20 Mg Tabs (Lisinopril) .Marland Kitchen... Take one tablet by mouth daily  Her updated medication list for this problem includes:    Metoprolol Tartrate 25 Mg Tabs (Metoprolol tartrate) .Marland Kitchen... Take 1 1/2 tablet by mouth twice a day    Aspirin Ec 325 Mg Tbec (Aspirin) .Marland Kitchen... Take one tablet by mouth daily    Amlodipine Besylate 5 Mg Tabs (Amlodipine besylate) .Marland Kitchen... Take one tablet by mouth daily    Lisinopril 20 Mg Tabs (Lisinopril) .Marland Kitchen... Take one tablet by mouth daily  Problem # 4:  HYPERCHOLESTEROLEMIA  IIA (ICD-272.0) Excellent numbers.  Given history with Lipitor, reduce dose and monitor symptoms during rehab. Her updated medication list for this problem includes:    Crestor 10 Mg Tabs (Rosuvastatin calcium) .Marland Kitchen... Take one tablet by mouth daily.  Patient Instructions: 1)  Your physician recommends that you schedule a follow-up appointment in: 6 WEEKS 2)  Your physician recommends that you return for a FASTING LIPID and LIVER Profile in 6 WEEKS  3)  Your physician has recommended you make the following change in your medication: DECREASE Crestor 10mg  once a day 4)  You can begin the Cardiac Rehab Program. 5)  Your physician has requested that you regularly monitor and record your blood pressure readings at home.  Please use the same machine at the same time of day to check your readings and record them to bring to your follow-up visit.

## 2010-12-02 NOTE — Letter (Signed)
Summary: [facility] Patient Vitals  [facility] Patient Vitals   Imported By: Roderic Ovens 02/20/2010 11:44:17  _____________________________________________________________________  External Attachment:    Type:   Image     Comment:   External Document

## 2010-12-02 NOTE — Progress Notes (Signed)
Summary: need order to return to cardiac rehab  Phone Note From Other Clinic   Caller: terry from [facility] [phone] Request: Talk with Nurse Summary of Call: need an order for Dilcia to return to cardiac rehab.  Initial call taken by: Lorne Skeens,  April 16, 2010 8:29 AM  Follow-up for Phone Call        I spoke with Aurther Loft and made her aware that Dr Riley Kill is currently out of the office .  I will speak with Dr Riley Kill next week about the pt returning to cardiac rehab. Julieta Gutting, RN, BSN  April 16, 2010 9:44 AM  pt calling again, request call [phone]  Migdalia Dk  April 18, 2010 9:52 AM   Additional Follow-up for Phone Call Additional follow up Details #1::        LMTCB Scherrie Bateman, LPN  April 18, 2010 3:27 PM PT RETURNED CALL Judie Grieve  April 18, 2010 4:33 PM PT AWARE  WILL LET DR Riley Kill RELEASE AND IF  DR Riley Kill  HAS QUESTIONS MAY ASK DR Ladona Ridgel . PT'S THOUGHTS WERE DR Ladona Ridgel NEEDED TO RELEASE SINCE NEW DEVICE IMPLANTED . INSTRUCTED WILL DEFER TO DR Riley Kill.VERBLAIZED UNDERSTANDING. Additional Follow-up by: Scherrie Bateman, LPN,  April 18, 2010 5:15 PM    Additional Follow-up for Phone Call Additional follow up Details #2::    Per Dr Riley Kill this pt can resume cardiac rehab. Pt aware. Order faxed to cardiac rehab. Follow-up by: Julieta Gutting, RN, BSN,  April 22, 2010 5:15 PM

## 2010-12-02 NOTE — Letter (Signed)
Summary: Custom - Lipid  [facility], Main Office  [address]   [place], [place] [address]   Phone: [phone]  Fax: [phone]     December 16, 2009 MRN: [id number]   [facility] [address] [place], [place]  [address]   Dear Ms. Burgner,  We have reviewed your cholesterol results.  They are as follows:     Total Cholesterol:    124 (Desirable: less than 200)       HDL  Cholesterol:     54.90  (Desirable: greater than 40 for men and 50 for women)       LDL Cholesterol:       52  (Desirable: less than 100 for low risk and less than 70 for moderate to high risk)       Triglycerides:       87.0  (Desirable: less than 150)  Our recommendations include: These numbers all look excellent.  Potassium and Kidney function are normal.  Your blood count and liver function were also normal. Continue Current Medications.  TS   Call our office at the number listed above if you have any questions.  Lowering your LDL cholesterol is important, but it is only one of a large number of "risk factors" that may indicate that you are at risk for heart disease, stroke or other complications of hardening of the arteries.  Other risk factors include:   A.  Cigarette Smoking* B.  High Blood Pressure* C.  Obesity* D.   Low HDL Cholesterol (see yours above)* E.   Diabetes Mellitus (higher risk if your is uncontrolled) F.  Family history of premature heart disease G.  Previous history of stroke or cardiovascular disease    *These are risk factors YOU HAVE CONTROL OVER.  For more information, visit .  There is now evidence that lowering the TOTAL CHOLESTEROL AND LDL CHOLESTEROL can reduce the risk of heart disease.  The American Heart Association recommends the following guidelines for the treatment of elevated cholesterol:  1.  If there is now current heart disease and less than two risk factors, TOTAL CHOLESTEROL should be less than 200 and LDL CHOLESTEROL should be less  than 100. 2.  If there is current heart disease or two or more risk factors, TOTAL CHOLESTEROL should be less than 200 and LDL CHOLESTEROL should be less than 70.  A diet low in cholesterol, saturated fat, and calories is the cornerstone of treatment for elevated cholesterol.  Cessation of smoking and exercise are also important in the management of elevated cholesterol and preventing vascular disease.  Studies have shown that 30 to 60 minutes of physical activity most days can help lower blood pressure, lower cholesterol, and keep your weight at a healthy level.  Drug therapy is used when cholesterol levels do not respond to therapeutic lifestyle changes (smoking cessation, diet, and exercise) and remains unacceptably high.  If medication is started, it is important to have you levels checked periodically to evaluate the need for further treatment options.  Thank you,    [employer] Team

## 2010-12-02 NOTE — Procedures (Signed)
Summary: Cardiology Device Clinic   Allergies: No Known Drug Allergies  PPM Specifications Following MD:  Lewayne Bunting, MD     PPM Vendor:  [employer]     PPM Model Number:  [id number]     PPM Serial Number:  [id number] PPM DOI:  04/03/2010     PPM Implanting MD:  Lewayne Bunting, MD  Lead 1    Location: [facility]     DOI: 04/03/2010     Model #: [id number]     Serial #: [id number]     Status: active Lead 2    Location: RV     DOI: 04/03/2010     Model #: [id number]     Serial #: [id number]     Status: active  Magnet Response Rate:  BOL 100 ERI  85  Indications:  [name] with pauses   PPM Follow Up Pacer Dependent:  No      Episodes Coumadin:  No  Parameters Mode:  DDD     Lower Rate Limit:  60     Upper Rate Limit:  100 Paced AV Delay:  300     Sensed AV Delay:  300 Tech Comments:  Erica Hood was checked today during her routine appt. with Dr. Riley Kill. On her EKG he noted Sinus brady @ 52bpm and her lower rate is 60.  On interrogation of her device today it is noted she has hysteresis on @ 50.  No parameter changes were made and she will follow up as scheduled. Erica Harm, Erica Hood  July 15, 2010 12:31 PM

## 2010-12-02 NOTE — Progress Notes (Signed)
Summary: clearance  Phone Note From Other Clinic Call back at [facility] Phone [phone]   Caller: Nurse Summary of Call: Per tammy is pt ok to stop plavix for 7 days for thyroid biospy 3 nodules. ofc [id number] fax [phone] Initial call taken by: Edman Circle,  Mar 17, 2010 4:04 PM  Follow-up for Phone Call        Dr Riley Kill is going to speak with Dr Everardo All about this patient.  I left a message for Tammy that our office is working on this issue. Dr Riley Kill would like to know which radiologist he needs to speak with about this pt. Julieta Gutting, RN, BSN  Mar 17, 2010 5:03 PM  Today at 1:00 Follow-up by: Julieta Gutting, RN, BSN,  Mar 18, 2010 9:58 AM  Additional Follow-up for Phone Call Additional follow up Details #1::        I spoke with Dr. Denny Levy and also Tammy.  Explained the situation with 3 DES, small, STEMI indication that stopping clopidogrel was not ideal (even with CABG).  He told me it could be done on the drug.  They will call patient and arrange.   Additional Follow-up by: Ronaldo Miyamoto, MD, [facility],  Mar 18, 2010 10:05 AM

## 2010-12-02 NOTE — Assessment & Plan Note (Signed)
Summary: new / medicare / # / cd   Vital Signs:  Patient profile:   70 year old female Height:      63.5 inches (161.29 cm) Weight:      211.4 pounds (96.09 kg) O2 Sat:      94 % on Room air Temp:     101.1 degrees F (38.39 degrees C) oral Pulse rate:   86 / minute BP sitting:   120 / 52  (left arm) Cuff size:   regular  Vitals Entered By: Orlan Leavens (February 20, 2010 8:10 AM)  O2 Flow:  Room air CC: New patient Is Patient Diabetic? No Pain Assessment Patient in pain? no        Primary Care Provider:  Newt Lukes MD  CC:  New patient.  History of Present Illness: new pt to me and our division - here to est care - known to cards  1) c/o chills and fever - ongoing > 2 weeks - a/w cold sweats (never had hot flashes) - +plueritic CP - on L side - has been told this is referred pain from CABG surg - no abd pain, N/V/D no dysuria or hematuria but +hx PN no rash, no travel or sick contacts  2) abn CT chest - 12/2009 reviewed - hx tobacco abuse - quit this Jan following MI and emergent CABG no hemopytsis or weight loss  3) thyroid nodule - noted on CT chest US done and ? results - denies trouble swallowing or feeling lumps - no skin, weight changes - no hx thyroid problmes  Preventive Screening-Counseling & Management  Alcohol-Tobacco     Alcohol drinks/day: <1     Alcohol Counseling: not indicated; use of alcohol is not excessive or problematic     Smoking Status: quit < 6 months     Year Quit: 2011     Tobacco Counseling: not to resume use of tobacco products  Caffeine-Diet-Exercise     Diet Counseling: to improve diet; diet is suboptimal     Does Patient Exercise: yes     Exercise Counseling: to improve exercise regimen     Depression Counseling: not indicated; screening negative for depression  Safety-Violence-Falls     Seat Belt Counseling: not indicated; patient wears seat belts     Helmet Counseling: not applicable     Firearm Counseling: not  applicable     Smoke Detectors: yes     Violence in the Home: no risk noted     Fall Risk Counseling: not indicated; no significant falls noted  Clinical Review Panels:  Prevention   Last Mammogram:  No specific mammographic evidence of malignancy.   (11/02/2005)   Last Pap Smear:  Interpretation/Result:Negative for intraepithelial Lesion or Malignancy.    (11/02/2005)  Immunizations   Last Tetanus Booster:  Historical (11/02/2005)   Last Flu Vaccine:  Historical (11/03/2009)   Last Pneumovax:  Historical (11/03/2009)  Lipid Management   Cholesterol:  140 (02/03/2010)   LDL (bad choesterol):  70 (02/03/2010)   HDL (good cholesterol):  53.60 (02/03/2010)  CBC   WBC:  6.4 (12/11/2009)   RBC:  4.13 (12/11/2009)   Hgb:  12.4 (12/11/2009)   Hct:  37.7 (12/11/2009)   Platelets:  308.0 (12/11/2009)   MCV  91.1 (12/11/2009)   MCHC  32.8 (12/11/2009)   RDW  13.1 (12/11/2009)   PMN:  50.9 (12/11/2009)   Lymphs:  33.6 (12/11/2009)   Monos:  12.3 (12/11/2009)   Eosinophils:  2.5 (12/11/2009)  Basophil:  0.7 (12/11/2009)  Complete Metabolic Panel   Glucose:  93 (12/11/2009)   Sodium:  138 (12/11/2009)   Potassium:  4.1 (12/11/2009)   Chloride:  102 (12/11/2009)   CO2:  30 (12/11/2009)   BUN:  19 (12/11/2009)   Creatinine:  0.8 (12/11/2009)   Albumin:  3.9 (02/03/2010)   Total Protein:  7.0 (02/03/2010)   Calcium:  9.4 (12/11/2009)   Total Bili:  0.4 (02/03/2010)   Alk Phos:  86 (02/03/2010)   SGPT (ALT):  16 (02/03/2010)   SGOT (AST):  15 (02/03/2010)   Current Medications (verified): 1)  Metoprolol Succinate 50 Mg Xr24h-Tab (Metoprolol Succinate) .... Take One and One-Half  Tablet By Mouth Daily 2)  Nitrostat 0.4 Mg Subl (Nitroglycerin) .Marland Kitchen.. 1 Tablet Under Tongue At Onset of Chest Pain; You May Repeat Every 5 Minutes For Up To 3 Doses. 3)  Acetaminophen 500 Mg Tabs (Acetaminophen) .... Take Two Times A Day As Needed 4)  Aspirin Ec 325 Mg Tbec (Aspirin) .... Take One  Tablet By Mouth Daily 5)  Plavix 75 Mg Tabs (Clopidogrel Bisulfate) .... Take 1 Tablet By Mouth Once A Day 6)  Amlodipine Besylate 5 Mg Tabs (Amlodipine Besylate) .... Take One and One-Half Tablet By Mouth Daily 7)  Crestor 10 Mg Tabs (Rosuvastatin Calcium) .... Take One Tablet By Mouth Daily. 8)  Lisinopril 20 Mg Tabs (Lisinopril) .... Take One Tablet By Mouth Daily 9)  Protonix 40 Mg Tbec (Pantoprazole Sodium) .... Take 1 Tablet By Mouth Once A Day  Allergies (verified): No Known Drug Allergies  Past History:  Past Medical History: CAD s/p 2v CABG 11/2009 s/p AMI ant wall PAF - no anticoag hypertension obesity Depression thyroid nodule 12/2009 CT chest - abn [place] 01/2010   MD rooster; cards - [facility] - (to be [facility])  Past Surgical History:  cholecystectomy (0102)  appendectomy (1960)  right breast lumpectomy (benign).  [phone]) Tonsillectomy (1956)   angioplasty  and stenting of her LAD  coronary  bypass grafting x2 on November 11, 2009 :  PROCEDURE:  Median sternotomy, off-pump coronary artery bypass grafting   x2 (saphenous vein graft in distal right coronary, left internal mammary   artery to LAD), endoscopic vein harvest right thigh.      SURGEON:  Salvatore Decent. Dorris Fetch, MD   Family History: Significant for premature cardiovascular   disease affecting her father in his 42s.     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    Smoking Status:  quit < 6 months Does Patient Exercise:  yes  Review of Systems       see HPI above. I have reviewed all other systems and they were negative.   Physical Exam  General:  overweight-appearing.  alert, well-developed, well-nourished, and cooperative to examination.   nontoxic Eyes:   vision grossly intact; pupils equal, round and reactive to light.  conjunctiva and lids normal.    Ears:  normal pinnae bilaterally, without erythema, swelling, or tenderness to palpation. TMs clear, without effusion, or cerumen impaction. Hearing grossly normal bilaterally  Mouth:  teeth and gums in good repair; mucous membranes moist, without lesions or ulcers. oropharynx clear without exudate, no erythema.  Neck:  supple, full ROM, no masses, no thyromegaly; no thyroid nodules or tenderness. no JVD or carotid bruits.  Lungs:  diminished BS on left - noraml BS right - no w/c - no inc wob at rest Heart:  normal rate, regular rhythm, no murmur, and no rub. BLE without edema. normal DP pulses and normal cap refill in all 4 extremities    Abdomen:  obese, soft, non-tender, normal bowel sounds, no distention; no masses and no appreciable hepatomegaly or splenomegaly.    Msk:  No deformity or scoliosis noted of thoracic or lumbar spine.   Neurologic:  alert & oriented X3 and cranial nerves II-XII symetrically intact.  strength normal in all extremities, sensation intact to light touch, and gait normal. speech fluent without dysarthria or aphasia; follows commands with good comprehension.  Skin:  no rashes, vesicles, ulcers, or erythema. No nodules or irregularity to palpation.  Psych:  Oriented X3, memory intact for recent and remote, normally interactive, good eye contact, not anxious appearing, not depressed appearing, and not agitated.      Impression & Recommendations:  Problem # 1:  FEVER UNSPECIFIED (ICD-780.60) nontox -  abn lung exam and hx "sileny pyelo" check labs and start abx as indicated based on these results Orders: T-Urine Culture (Spectrum Order) [id number]) T-Culture, Blood Routine ([id number]) T-2 View CXR ([id number]) TLB-CBC Platelet - w/Differential (85025-CBCD) TLB-Udip w/ Micro (81001-URINE)  Problem # 2:  THYROID NODULE (ICD-241.0) recent US reviewed - refer to endo  for probable bx, check labs Orders: Endocrinology Referral (Endocrine) TLB-TSH (Thyroid Stimulating Hormone) (84443-TSH) TLB-T3, Free (Triiodothyronine) (84481-T3FREE) TLB-T4 (Thyrox), Total (43329-J1)  Problem # 3:  LUNG NODULE (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 Take one and one-half  tablet by mouth daily    Nitrostat 0.4 Mg Subl (Nitroglycerin) .Marland Kitchen... 1 tablet under tongue at onset of chest pain; you may repeat every 5 minutes for up to 3 doses.    Aspirin Ec 325 Mg Tbec (Aspirin) .Marland Kitchen... Take one tablet by mouth daily    Plavix 75 Mg Tabs (Clopidogrel bisulfate) .Marland Kitchen... Take 1 tablet by mouth once a day    Amlodipine Besylate 5 Mg Tabs (Amlodipine besylate) .Marland Kitchen... Take one and one-half tablet by mouth daily    Lisinopril 20 Mg Tabs (Lisinopril) .Marland Kitchen... Take one tablet by mouth daily  Labs Reviewed: Chol: 140 (02/03/2010)   HDL: 53.60 (02/03/2010)   LDL: 70 (02/03/2010)   TG: 81.0 (02/03/2010)  Problem # 5:  PAROXYSMAL ATRIAL FIBRILLATION (ICD-427.31)  transient - prompted 12/2009 hosp - converted, no need for anticoag at this time - f/u as needed with cards Her updated medication list for this problem includes:    Metoprolol Succinate 50 Mg Xr24h-tab (Metoprolol succinate) .Marland Kitchen... Take one and one-half  tablet by mouth daily    Aspirin Ec 325 Mg Tbec (Aspirin) .Marland Kitchen... Take one tablet by mouth daily    Plavix 75 Mg Tabs (Clopidogrel bisulfate) .Marland Kitchen... Take 1 tablet by mouth once a day    Amlodipine Besylate 5 Mg Tabs (Amlodipine besylate) .Marland Kitchen... Take one and one-half tablet by  mouth daily  Reviewed the following: Coumadin Dose (weekly): 20 mg (12/11/2009) Prior Coumadin Dose (weekly): 20 mg (  12/11/2009) Next Protime: 12/16/2009 (dated on 12/02/2009)  Problem # 6:  OBESITY (ICD-278.00)  Ht: 63.5 (02/20/2010)   Wt: 211.4 (02/20/2010)   BMI: 36.75 (02/05/2010)  Problem # 7:  HYPERTENSION, UNSPECIFIED (ICD-401.9)  Her updated medication list for this problem includes:    Metoprolol Succinate 50 Mg Xr24h-tab (Metoprolol succinate) .Marland Kitchen... Take one and one-half  tablet by mouth daily    Amlodipine Besylate 5 Mg Tabs (Amlodipine besylate) .Marland Kitchen... Take one and one-half tablet by mouth daily    Lisinopril 20 Mg Tabs (Lisinopril) .Marland Kitchen... Take one tablet by mouth daily  BP today: 120/52 Prior BP: 158/68 (02/05/2010)  Labs Reviewed: K+: 4.1 (12/11/2009) Creat: : 0.8 (12/11/2009)   Chol: 140 (02/03/2010)   HDL: 53.60 (02/03/2010)   LDL: 70 (02/03/2010)   TG: 81.0 (02/03/2010)  Time spent with patient 60 minutes, more than 50% of this time was spent reviewing hospitalizations this year, medication and test results reviewed, counseling patient on possible causes of fever and need for further eval of same - also need for f/u on thyroid and lung abn on recent tests  Complete Medication List: 1)  Metoprolol Succinate 50 Mg Xr24h-tab (Metoprolol succinate) .... Take one and one-half  tablet by mouth daily 2)  Nitrostat 0.4 Mg Subl (Nitroglycerin) .Marland Kitchen.. 1 tablet under tongue at onset of chest pain; you may repeat every 5 minutes for up to 3 doses. 3)  Acetaminophen 500 Mg Tabs (Acetaminophen) .... 2 tab by mouth every 6 hours as needed, max dose 4000mg /24h 4)  Aspirin Ec 325 Mg Tbec (Aspirin) .... Take one tablet by mouth daily 5)  Plavix 75 Mg Tabs (Clopidogrel bisulfate) .... Take 1 tablet by mouth once a day 6)  Amlodipine Besylate 5 Mg Tabs (Amlodipine besylate) .... Take one and one-half tablet by mouth daily 7)  Crestor 10 Mg Tabs (Rosuvastatin calcium) .... Take one tablet  by mouth daily. 8)  Lisinopril 20 Mg Tabs (Lisinopril) .... Take one tablet by mouth daily 9)  Protonix 40 Mg Tbec (Pantoprazole sodium) .... Take 1 tablet by mouth once a day  Other Orders: Prescription Created Electronically [id number])  Patient Instructions: 1)  it was good to see you today. 2)  test(s) ordered today - your results will be called to you in 48-72 hours from the time of test completion - if antibiotics are needed, they will be called in at that time - 3)  we'll make referral to Dr. Lorane Gell for your thyroid nodules - likely need biopsy to further evaluate but will let him decide. Our office will contact you regarding this appointment once made.  4)  will recheck the CT scan of your lungs in July-Aug timeline -  5)  Please schedule a follow-up appointment in 3 months for routine review, sooner if problems re: the fever workup or anything else.  6)  Take 650-1000mg  of Tylenol every 4-6 hours as needed for relief of pain or comfort of fever AVOID taking more than 4000mg   in a 24 hour period (can cause liver damage in higher doses).   Pap Smear  Procedure date:  11/02/2005  Findings:      Interpretation/Result:Negative for intraepithelial Lesion or Malignancy.     Mammogram  Procedure date:  11/02/2005  Findings:      No specific mammographic evidence of malignancy.    MISC. Report  Procedure date:  01/05/2010  Findings:      Type of Report: CT angiography chest without contrast Impression: No evidence pulmonary embolism. Question asymmetric left  thyroid lobe versus mass/nodule inferior ploe, recommend correclation with  physical exam and potenially thyroid ultrasound. 7.110mm diameter questionable nodular focus versus less likely infiltrate in the right middle lobe, If patient is at high risk for bronchogenic carcinoma, followup  CT is recommended 3-6 months     Immunization History:  Tetanus/Td Immunization History:    Tetanus/Td:  historical  (11/02/2005)  Influenza Immunization History:    Influenza:  historical (11/03/2009)  Pneumovax Immunization History:    Pneumovax:  historical (11/03/2009)  append -  note labs return with elev WBC and +UTI (many bact)- start Cipro x 7days while awaiting Ucx/Bld cx (see append on labs today re: same) CXR small residual pleural effusion - NAD Newt Lukes MD  February 20, 2010 1:39 PM

## 2010-12-02 NOTE — Medication Information (Signed)
Summary: ccn/ coumadin 5 mg/ gd  Anticoagulant Therapy  Managed by: Cloyde Reams, RN, BSN Referring MD: Dr Shawnie Pons Supervising MD: Myrtis Ser MD, Tinnie Gens Indication 1: Atrial Fibrillation Lab Used: [facility] [place] Site: [address] INR POC 2.2 INR RANGE 2.0-3.0  Dietary changes: yes       Details: Pt educated on importance of consistancy of vit K in the diet.  Health status changes: no    Bleeding/hemorrhagic complications: yes       Details: Pt advised of incr bleeding risks and precaution assoc with coumadin.  Recent/future hospitalizations: yes       Details: Most recent d/c on 11/17/09.  Any changes in medication regimen? yes       Details: Medication list updated and reviewed with pt.  Pt aware to call with new or d/c meds.  Recent/future dental: no  Any missed doses?: yes     Details: Pt made aware of importance of medication compliance.  Is patient compliant with meds? yes      Comments: Pt had 2 vessel bypass surgery recently.  Most recent discharge from [facility] on 11/17/09.  Started on Coumadin 5mg  daily, INR 1.03 on 11/17/09.  Pt's chest tube site R side is slightly open, interior has nice scab covering, no visible drainage present or per pt report.  Site is not painful, and surrounding tissue is normal, no reddness or pain present.  Advised pt to monitor closely ad seek medical attention if border becomes red, reddness spreads, pain or drainage occurs or the wound begains to open more.  Advised pt to avoid heavy lifting or straining.  Pt WCB with problems, new pt education completed.  Current Medications (verified): 1)  Metoprolol Tartrate 25 Mg Tabs (Metoprolol Tartrate) .... Take 1 1/2 Tablet By Mouth Twice A Day 2)  Nitrostat 0.4 Mg Subl (Nitroglycerin) .Marland Kitchen.. 1 Tablet Under Tongue At Onset of Chest Pain; You May Repeat Every 5 Minutes For Up To 3 Doses. 3)  Warfarin Sodium 5 Mg Tabs (Warfarin Sodium) .... Take As Directed By Coumadin Clinic. 4)   Acetaminophen 500 Mg Tabs (Acetaminophen) .... Take Two Times A Day As Needed 5)  Aspirin Ec 325 Mg Tbec (Aspirin) .... Take One Tablet By Mouth Daily 6)  Plavix 75 Mg Tabs (Clopidogrel Bisulfate) .... Take 1 Tablet By Mouth Once A Day 7)  Furosemide 40 Mg Tabs (Furosemide) .... Take One Tablet By Mouth Daily. 8)  Lisinopril 10 Mg Tabs (Lisinopril) .... Take One Tablet By Mouth Daily 9)  Crestor 20 Mg Tabs (Rosuvastatin Calcium) .... Take 1 Tab By Mouth At Bedtime  Allergies (verified): No Known Drug Allergies  Anticoagulation Management History:      The patient comes in today for her initial visit for anticoagulation therapy.  Positive risk factors for bleeding include an age of 68 years or older.  The bleeding index is 'intermediate risk'.  Negative CHADS2 values include Age > 68 years old.  Anticoagulation responsible provider: Myrtis Ser MD, Tinnie Gens.  INR POC: 2.2.  Cuvette Lot#: [id number].  Exp: 02/2011.    Anticoagulation Management Assessment/Plan:      The patient's current anticoagulation dose is Warfarin sodium 5 mg tabs: Take as directed by coumadin clinic..  The target INR is 2.0-3.0.  The next INR is due 11/25/2009.  Results were reviewed/authorized by Cloyde Reams, RN, BSN.  She was notified by Cloyde Reams RN.         Current Anticoagulation Instructions: INR 2.2  Start taking 1/2  tablet daily except 1 tablet on Tuesdays and Saturdays.  Recheck on Monday.

## 2010-12-02 NOTE — Letter (Signed)
[facility]: [facility]   Imported By: Marylou Mccoy 02/20/2010 10:24:49  _____________________________________________________________________  External Attachment:    Type:   Image     Comment:   External Document

## 2010-12-02 NOTE — Progress Notes (Signed)
Summary: f/u ct lung 3 mo  ---- Converted from flag ---- ---- 03/11/2010 9:16 AM, Newt Lukes MD wrote: sched 3 mo f/u ct chest - recheck abn RML from 01/05/10 scan ------------------------------  ordered. pcc to contact pt regarding same once scheduled. Newt Lukes MD  April 10, 2010 9:13 AM  Phone Note Outgoing Call

## 2010-12-02 NOTE — Letter (Signed)
Summary: Letter from Patient  Letter from Patient   Imported By: Sherian Rein 03/13/2010 11:30:44  _____________________________________________________________________  External Attachment:    Type:   Image     Comment:   External Document

## 2010-12-02 NOTE — Consult Note (Signed)
[facility]   Imported By: Roderic Ovens 11/27/2009 14:44:53  _____________________________________________________________________  External Attachment:    Type:   Image     Comment:   External Document

## 2010-12-02 NOTE — Progress Notes (Signed)
Summary: B/P RUNNING HIGH  Phone Note Call from Patient Call back at [facility] Phone [phone]   Caller: Patient Summary of Call: PT B/P IS RUNNING  HIGH 150TO 174 TOP NUMBER [id number] [id number] BOTTOM NUMBER. Initial call taken by: Judie Grieve,  December 06, 2009 1:47 PM  Follow-up for Phone Call        I spoke with the pt's husband and they saw Dr Dorris Fetch this week and the pt's BP was elevated.  Dr Dorris Fetch asked them to start monitoring the pt's BP at home.  I instructed the pt's husband to keep a record of the pt's BP and bring them to her appt with Dr Riley Kill on 12/11/09.  I will forward this information to Dr Riley Kill for review.  If Dr Riley Kill has recommendations prior to the pt's appt I will contact the pt. Follow-up by: Julieta Gutting, RN, BSN,  December 06, 2009 4:30 PM  Additional Follow-up for Phone Call Additional follow up Details #1::        agree with plan.  Will see back in office and review, make adjustments. Additional Follow-up by: Ronaldo Miyamoto, MD, [facility],  December 10, 2009 6:10 PM

## 2010-12-02 NOTE — Letter (Signed)
[facility]   Imported By: Marylou Mccoy 02/19/2010 14:53:26  _____________________________________________________________________  External Attachment:    Type:   Image     Comment:   External Document

## 2010-12-02 NOTE — Cardiovascular Report (Signed)
Summary: Office Visit   Office Visit   Imported By: Roderic Ovens 05/01/2010 12:47:02  _____________________________________________________________________  External Attachment:    Type:   Image     Comment:   External Document

## 2010-12-02 NOTE — Miscellaneous (Signed)
Summary: [facility]   [facility]   Imported By: Sherian Rein 05/15/2010 08:42:13  _____________________________________________________________________  External Attachment:    Type:   Image     Comment:   External Document

## 2010-12-02 NOTE — Assessment & Plan Note (Signed)
Summary: pc2/jml   Visit Type:  Follow-up Referring Khia Dieterich:  Raenette Rover. Lescher MD Primary Venus Gilles:  Newt Lukes MD   History of Present Illness: Erica Hood returns today for followup.  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  She is stable.  She denies c/p, sob, or peripheral edema.  She denies c/p or peripheral edema.  Current Medications (verified): 1)  Metoprolol Succinate 50 Mg Xr24h-Tab (Metoprolol Succinate) .... Take 1 1/2 Tablet Daily 2)  Nitrostat 0.4 Mg Subl (Nitroglycerin) .Marland Kitchen.. 1 Tablet Under Tongue At Onset of Chest Pain; You May Repeat Every 5 Minutes For Up To 3 Doses. 3)  Acetaminophen 500 Mg Tabs (Acetaminophen) .... 2 Tab By Mouth Every 6 Hours As Needed, Max Dose 4000mg /24h 4)  Aspirin 81 Mg Tbec (Aspirin) .... Take One Tablet By Mouth Daily 5)  Plavix 75 Mg Tabs (Clopidogrel Bisulfate) .... Take 1 Tablet By Mouth Once A Day 6)  Amlodipine Besylate 5 Mg Tabs (Amlodipine Besylate) .... Take 1 1/2 Tablet Daily 7)  Crestor 10 Mg Tabs (Rosuvastatin Calcium) .... Take One Tablet By Mouth Daily. 8)  Lisinopril 20 Mg Tabs (Lisinopril) .... Take One Tablet By Mouth Daily 9)  Protonix 40 Mg Tbec (Pantoprazole Sodium) .... Take 1 Tablet By Mouth Once A Day  Allergies (verified): No Known Drug Allergies  Past History:  Past Medical History: Last updated: 04/16/2010 CAD s/p 2v CABG 11/2009 s/p AMI ant wall PAF - no anticoag s/p PPM 04/02/10 - SSS hypertension obesity Depression thyroid nodule 12/2009 CT chest - abn [place] 01/2010 - bx done - neg RUL lung nodule - 04/2010 ct f/u - stable - f/u 10/2010   MD roster; cards - [facility] - ellison  Past Surgical History: Last updated: 02/20/2010  cholecystectomy (1981)  appendectomy (1960)  right breast lumpectomy (benign).  [phone]) Tonsillectomy (1956)   angioplasty  and stenting of her LAD  coronary  bypass grafting x2 on November 11, 2009 :  PROCEDURE:  Median sternotomy, off-pump coronary artery bypass grafting   x2 (saphenous vein graft in distal right coronary, left internal mammary   artery to LAD), endoscopic vein harvest right thigh.      SURGEON:  Salvatore Decent. Dorris Fetch, MD   Review of Systems  The patient denies chest pain, syncope, dyspnea on exertion, and peripheral edema.    Vital Signs:  Patient profile:   70 year old female Height:      63.5 inches Weight:      218 pounds BMI:     38.15 Pulse rate:   53 / minute BP sitting:   156 / 80  (right arm)  Vitals Entered By: Laurance Flatten CMA (July 08, 2010 3:43 PM)  Physical Exam  General:  Well developed, well nourished, in no acute distress. Head:  normocephalic and atraumatic Eyes:  PERRLA/EOM intact; conjunctiva and lids normal. Mouth:  Oral mucosa and oropharynx without lesions or exudates.  Teeth in good repair. Neck:  supple, full ROM, no masses, no thyromegaly, no JVD, normal carotid upstroke, no carotid bruits, no cervical lymphadenopathy, and no neck tenderness.   Chest Wall:  No deformities, masses, or tenderness noted. Lungs:  Clear bilaterally to auscultation with no wheezes, rales, or rhonchi. Heart:  Regular Rhythm.  Normal S1 and S2.  No murmur.   Abdomen:  Bowel sounds positive; abdomen soft and non-tender without masses, organomegaly, or hernias noted. No hepatosplenomegaly. Msk:  normal ROM, no joint tenderness, no joint swelling, no joint warmth, no redness over joints, no joint deformities, no joint instability, no crepitation, and no muscle atrophy.   Pulses:  pulses normal in all 4 extremities Extremities:  No clubbing or cyanosis. Neurologic:  Alert and oriented x 3.   PPM Specifications Following MD:  Lewayne Bunting, MD     PPM Vendor:  [employer]     PPM Model Number:  [id number]     PPM Serial Number:  [id number] PPM DOI:  04/03/2010     PPM Implanting MD:  Lewayne Bunting, MD  Lead 1    Location: RA     DOI: 04/03/2010     Model #: [id number]      Serial #: [id number]     Status: active Lead 2    Location: RV     DOI: 04/03/2010     Model #: [id number]     Serial #: [id number]     Status: active  Magnet Response Rate:  BOL 100 ERI  85  Indications:  Erica Hood with pauses   PPM Follow Up Remote Check?  No Battery Voltage:  2.99 V     Battery Est. Longevity:  10.9 years     Pacer Dependent:  No       PPM Device Measurements Atrium  Amplitude: 3.5 mV, Impedance: 450 ohms, Threshold: 0.875 V at 0.4 msec Right Ventricle  Amplitude: 9.6 mV, Impedance: 380 ohms, Threshold: 0.875 V at 0.4 msec  Episodes MS Episodes:  0     Percent Mode Switch:  0     Coumadin:  No Atrial Pacing:  9.3%     Ventricular Pacing:  <1%  Parameters Mode:  DDD     Lower Rate Limit:  60     Upper Rate Limit:  100 Paced AV Delay:  300     Sensed AV Delay:  300 Next Cardiology Appt Due:  04/03/2011 Tech Comments:  Outputs reprogrammed for chronic thresholds.  Atrial and ventricular autocapture on.  ROV 6/12 with Dr. Ladona Ridgel. Altha Harm, LPN  July 08, 2010 3:37 PM  MD Comments:  Agree with above.  Impression & Recommendations:  Problem # 1:  CARDIAC PACEMAKER IN SITU (ICD-V45.01) Her device is working normally.  Will recheck in several months.  Problem # 2:  CORONARY ARTERY DISEASE (ICD-414.00) She denies anginal symptoms.  Continue meds as below. Her updated medication list for this problem includes:    Metoprolol Succinate 50 Mg Xr24h-tab (Metoprolol succinate) .Marland Kitchen... Take 1 1/2 tablet daily    Nitrostat 0.4 Mg Subl (Nitroglycerin) .Marland Kitchen... 1 tablet under tongue at onset of chest pain; you may repeat every 5 minutes for up to 3 doses.    Aspirin 81 Mg Tbec (Aspirin) .Marland Kitchen... Take one tablet by mouth daily    Plavix 75 Mg Tabs (Clopidogrel bisulfate) .Marland Kitchen... Take 1 tablet by mouth once a day    Amlodipine Besylate 5 Mg Tabs (Amlodipine besylate) .Marland Kitchen... Take 1 1/2 tablet daily    Lisinopril 20 Mg Tabs (Lisinopril) .Marland Kitchen... Take one tablet by mouth daily  Problem # 3:   HYPERCHOLESTEROLEMIA  IIA (ICD-272.0) She will continue crestor and maintain a low fat diet.  I have asked her to lose weight. Her updated medication list for this problem includes:    Crestor 10 Mg Tabs (Rosuvastatin calcium) .Marland Kitchen... Take one tablet by mouth daily.  Patient Instructions: 1)  Your physician recommends that you schedule a follow-up appointment in: June of 2012  with Dr Ladona Ridgel

## 2010-12-02 NOTE — Letter (Signed)
Summary: Erica Hood   Imported By: Marylou Mccoy 12/19/2009 09:19:08  _____________________________________________________________________  External Attachment:    Type:   Image     Comment:   External Document

## 2010-12-02 NOTE — Medication Information (Signed)
Summary: rov/eh  Anticoagulant Therapy  Managed by: Bethanne Ginger, PharmD Referring MD: Dr Shawnie Pons Supervising MD: Shirlee Latch MD, Alyza Artiaga Indication 1: Atrial Fibrillation Lab Used: [facility] [place] Site: [address] INR POC 3 INR RANGE 2.0-3.0  Dietary changes: no    Health status changes: no    Bleeding/hemorrhagic complications: no    Recent/future hospitalizations: no    Any changes in medication regimen? yes       Details: WIll be on a short course of prednisone  Recent/future dental: no  Any missed doses?: no       Is patient compliant with meds? yes      Comments: Prednisone 5mg  tabs: 5 tabs day 1 4 tabs day 2 3 tabs day 3 2 tabs day 4 1 tab day 5  Current Medications (verified): 1)  Metoprolol Tartrate 25 Mg Tabs (Metoprolol Tartrate) .... Take 1 1/2 Tablet By Mouth Twice A Day 2)  Nitrostat 0.4 Mg Subl (Nitroglycerin) .Marland Kitchen.. 1 Tablet Under Tongue At Onset of Chest Pain; You May Repeat Every 5 Minutes For Up To 3 Doses. 3)  Warfarin Sodium 5 Mg Tabs (Warfarin Sodium) .... Take As Directed By Coumadin Clinic. 4)  Acetaminophen 500 Mg Tabs (Acetaminophen) .... Take Two Times A Day As Needed 5)  Aspirin Ec 325 Mg Tbec (Aspirin) .... Take One Tablet By Mouth Daily 6)  Plavix 75 Mg Tabs (Clopidogrel Bisulfate) .... Take 1 Tablet By Mouth Once A Day 7)  Furosemide 40 Mg Tabs (Furosemide) .... Take One Tablet By Mouth Daily. 8)  Lisinopril 10 Mg Tabs (Lisinopril) .... Take One Tablet By Mouth Daily 9)  Crestor 20 Mg Tabs (Rosuvastatin Calcium) .... Take 1 Tab By Mouth At Bedtime  Allergies (verified): No Known Drug Allergies  Anticoagulation Management History:      Positive risk factors for bleeding include an age of 68 years or older.  The bleeding index is 'intermediate risk'.  Negative CHADS2 values include Age > 68 years old.  Anticoagulation responsible provider: Shirlee Latch MD, Emry Barbato.  INR POC: 3.  Exp: 02/2011.    Anticoagulation  Management Assessment/Plan:      The patient's current anticoagulation dose is Warfarin sodium 5 mg tabs: Take as directed by coumadin clinic..  The target INR is 2.0-3.0.  The next INR is due 12/16/2009.  Anticoagulation instructions were given to patient.  Results were reviewed/authorized by Bethanne Ginger, PharmD.  She was notified by Bethanne Ginger.         Prior Anticoagulation Instructions: INR 2.7  Continue same dose of 0.5 tablet daily except 1 tablet Tuesdays and Saturdays. Recheck in 1 week.  Current Anticoagulation Instructions: Will decrease dose to:  1/2 tab everyday except take 1 whole tab on Saturday

## 2010-12-02 NOTE — Assessment & Plan Note (Signed)
Summary: SHOULDER PAIN/ VAL'S PT /NWS   Vital Signs:  Patient profile:   70 year old female Height:      63.5 inches Weight:      208.75 pounds BMI:     36.53 O2 Sat:      96 % on Room air Temp:     98.2 degrees F oral Pulse rate:   65 / minute Pulse rhythm:   regular Resp:     16 per minute BP sitting:   130 / 68  (left arm) Cuff size:   large  Vitals Entered By: Rock Nephew CMA (April 02, 2010 9:30 AM)  Nutrition Counseling: Patient's BMI is greater than 25 and therefore counseled on weight management options.  O2 Flow:  Room air CC: Shoulder pain Is Patient Diabetic? No Pain Assessment Patient in pain? no        Primary Care Provider:  Newt Lukes MD  CC:  Shoulder pain.  History of Present Illness: New to me she complains of an intermittent pain that is located over both anterior, upper chest areas and over the traps that has been present for 3 months. The pain is described as a stabbing, poking, and pricking sensation that occured recently while she was exercising at cardiac rehab and is associated with deep breaths at  times. There is also some discomfort around her neck but there is no pain that radiates into her shoulders or arms. There is no pain associated with activity such as lifting or moving. When the pain is severe she will take tylenol and sleep and the pain will reslove. She says that she has recently had a CT scan done of her chest and she has a small nodule that is being followed but it does not sound like there is lung disease that explains her symtoms. She does  not think the pain is caused by Crestor due to the timing of the pain.  Preventive Screening-Counseling & Management  Alcohol-Tobacco     Alcohol drinks/day: <1     Alcohol Counseling: not indicated; use of alcohol is not excessive or problematic     Smoking Status: quit < 6 months     Year Quit: 2011     Tobacco Counseling: not to resume use of tobacco  products  Hep-HIV-STD-Contraception     Hepatitis Risk: no risk noted     HIV Risk: no risk noted     STD Risk: no risk noted      Sexual History:  currently monogamous.        Drug Use:  never.        Blood Transfusions:  no.    Clinical Review Panels:  Diabetes Management   Creatinine:  0.8 (12/11/2009)   Last Flu Vaccine:  Historical (11/03/2009)   Last Pneumovax:  Historical (11/03/2009)  CBC   WBC:  11.3 (02/20/2010)   RBC:  4.72 (02/20/2010)   Hgb:  13.4 (02/20/2010)   Hct:  39.9 (02/20/2010)   Platelets:  323.0 (02/20/2010)   MCV  84.5 (02/20/2010)   MCHC  33.6 (02/20/2010)   RDW  15.8 (02/20/2010)   PMN:  78.7 (02/20/2010)   Lymphs:  13.0 (02/20/2010)   Monos:  8.1 (02/20/2010)   Eosinophils:  0.0 (02/20/2010)   Basophil:  0.2 (02/20/2010)  Complete Metabolic Panel   Glucose:  93 (12/11/2009)   Sodium:  138 (12/11/2009)   Potassium:  4.1 (12/11/2009)   Chloride:  102 (12/11/2009)   CO2:  30 (12/11/2009)  BUN:  19 (12/11/2009)   Creatinine:  0.8 (12/11/2009)   Albumin:  3.9 (02/03/2010)   Total Protein:  7.0 (02/03/2010)   Calcium:  9.4 (12/11/2009)   Total Bili:  0.4 (02/03/2010)   Alk Phos:  86 (02/03/2010)   SGPT (ALT):  16 (02/03/2010)   SGOT (AST):  15 (02/03/2010)   Problems Prior to Update: 1)  Neck Pain, Acute  (ICD-723.1) 2)  Chest Pain  (ICD-786.50) 3)  Skin Tag  (ICD-701.9) 4)  Goiter, 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 Take 1 1/2 Tablet Daily 2)  Nitrostat 0.4 Mg Subl (Nitroglycerin) .Marland Kitchen.. 1 Tablet Under Tongue At Onset of Chest Pain; You May Repeat Every 5 Minutes For Up To 3 Doses. 3)  Acetaminophen 500 Mg Tabs (Acetaminophen) .... 2 Tab By Mouth Every 6 Hours As Needed, Max Dose 4000mg /24h 4)  Aspirin 81 Mg Tbec (Aspirin) .... Take One Tablet By Mouth Daily 5)  Plavix 75 Mg Tabs (Clopidogrel Bisulfate) .... Take 1 Tablet By Mouth Once A Day 6)  Amlodipine Besylate 5 Mg Tabs (Amlodipine Besylate) .... Take 1 1/2 Tablet Daily 7)  Crestor 10 Mg Tabs (Rosuvastatin Calcium) .... Take One Tablet By Mouth Daily. 8)  Lisinopril 20 Mg Tabs (Lisinopril) .... Take One Tablet By Mouth Daily 9)  Protonix 40 Mg Tbec (Pantoprazole Sodium) .... Take 1 Tablet By Mouth Once A Day  Current Medications (verified): 1)  Metoprolol Succinate 50 Mg Xr24h-Tab (Metoprolol Succinate) .... Take 1 1/2 Tablet Daily 2)  Nitrostat 0.4 Mg Subl (Nitroglycerin) .Marland Kitchen.. 1 Tablet Under Tongue At Onset of Chest Pain; You May Repeat Every 5 Minutes For Up To 3 Doses. 3)  Acetaminophen 500 Mg Tabs (Acetaminophen) .... 2 Tab By Mouth Every 6 Hours As Needed, Max Dose 4000mg /24h 4)  Aspirin 81 Mg Tbec (Aspirin) .... Take One Tablet By Mouth Daily 5)  Plavix 75 Mg Tabs (Clopidogrel Bisulfate) .... Take 1 Tablet By Mouth Once A Day 6)  Amlodipine Besylate 5 Mg Tabs (Amlodipine Besylate) .... Take 1 1/2 Tablet Daily 7)  Crestor 10 Mg Tabs (Rosuvastatin Calcium) .... Take One Tablet By Mouth Daily. 8)  Lisinopril 20 Mg Tabs (Lisinopril) .... Take One Tablet By Mouth Daily 9)  Protonix 40 Mg Tbec (Pantoprazole Sodium) .... Take 1 Tablet By Mouth Once A Day  Allergies (verified): No Known Drug Allergies  Past History:  Past Medical History: Last updated: 03/11/2010 CAD s/p 2v CABG 11/2009 s/p AMI ant wall PAF - no anticoag hypertension obesity Depression thyroid nodule 12/2009 CT chest - abn [place] 01/2010   MD  [place]; cards - [facility] - [facility]ison  Past Surgical History: Last updated: 02/20/2010  cholecystectomy (1981)  appendectomy (1960)  right breast lumpectomy (benign).  [phone]) Tonsillectomy (1956)   angioplasty  and stenting of her LAD  coronary  bypass grafting x2 on November 11, 2009 :  PROCEDURE:  Median sternotomy, off-pump coronary artery bypass grafting   x2 (saphenous vein graft in distal right coronary, left internal  mammary   artery to LAD), endoscopic vein harvest right thigh.      SURGEON:  Salvatore Decent. Dorris Fetch, MD   Family History: Last updated: 03/06/2010 Significant for premature cardiovascular   disease affecting her father in his 18s.     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     Risk Factors: Alcohol Use: <1 (04/02/2010) Exercise: yes (02/20/2010)  Risk Factors: Smoking Status: quit < 6 months (04/02/2010)  Family History: Reviewed history from 03/06/2010 and no changes required. Significant for premature cardiovascular   disease affecting her father in his 31s.     Family History of Alcoholism/Addiction (parent) Family History of Arthritis (parent) Family History Hypertension (parent, other relative) Cervical cancer (mother)  no goiter, but dtr has hypothyroidism     Social History: Reviewed history from 02/20/2010 and no changes required. 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    Hepatitis Risk:  no risk noted HIV Risk:  no risk noted STD Risk:  no risk noted Sexual History:  currently monogamous Drug Use:  never Blood Transfusions:  no  Review of Systems       The  patient complains of weight gain and chest pain.  The patient denies anorexia, fever, weight loss, syncope, dyspnea on exertion, peripheral edema, prolonged cough, headaches, hemoptysis, abdominal pain, hematuria, suspicious skin lesions, difficulty walking, depression, enlarged lymph nodes, and angioedema.   CV:  Complains of chest pain or discomfort; denies difficulty breathing at night, difficulty breathing while lying down, fainting, fatigue, lightheadness, near fainting, palpitations, shortness of breath with exertion, and swelling of feet. MS:  Denies joint pain, joint redness, joint swelling, loss of strength, low back pain, mid back pain, muscle aches, and stiffness.  Physical Exam  General:  alert, well-developed, well-nourished, well-hydrated, appropriate dress, normal appearance, healthy-appearing, cooperative to examination, good hygiene, and overweight-appearing.   Head:  normocephalic, atraumatic, no abnormalities observed, and no abnormalities palpated.   Eyes:  vision grossly intact, pupils equal, pupils round, and pupils reactive to light.   Mouth:  Oral mucosa and oropharynx without lesions or exudates.  Teeth in good repair. Neck:  supple, full ROM, no masses, no thyromegaly, no JVD, normal carotid upstroke, no carotid bruits, no cervical lymphadenopathy, and no neck tenderness.   Chest Wall:  No deformities, masses, or tenderness noted. Breasts:  No mass, nodules, thickening, tenderness, bulging, retraction, inflamation, nipple discharge or skin changes noted.   Lungs:  normal respiratory effort, no intercostal retractions, no accessory muscle use, normal breath sounds, no dullness, no fremitus, no crackles, and no wheezes.   Heart:  Normal rate and regular rhythm. S1 and S2 normal without gallop, murmur, click, rub or other extra sounds. Abdomen:  soft, non-tender, normal bowel sounds, no distention, no masses, no guarding, no rigidity, no rebound tenderness, no abdominal hernia,  no inguinal hernia, no hepatomegaly, and no splenomegaly.   Msk:  normal ROM, no joint tenderness, no joint swelling, no joint warmth, no redness over joints, no joint deformities, no joint instability, no crepitation, and no muscle atrophy.   Pulses:  R and L carotid,radial,femoral,dorsalis pedis and posterior tibial pulses are full and equal bilaterally Extremities:  No clubbing, cyanosis, edema, or deformity noted  with normal full range of motion of all joints.   Neurologic:  No cranial nerve deficits noted. Station and gait are normal. Plantar reflexes are down-going bilaterally. DTRs are symmetrical throughout. Sensory, motor and coordinative functions appear intact. Skin:  turgor normal, color normal, no rashes, no suspicious lesions, no ecchymoses, no petechiae, no purpura, no ulcerations, and no edema.   Cervical Nodes:  no anterior cervical adenopathy and no posterior cervical adenopathy.   Axillary Nodes:  no R axillary adenopathy and no L axillary adenopathy.   Psych:  Cognition and judgment appear intact. Alert and cooperative with normal attention span and concentration. No apparent delusions, illusions, hallucinations   Impression & Recommendations:  Problem # 1:  NECK PAIN, ACUTE (ICD-723.1) Assessment New this could explain some of her symptoms, will check plain films and if it looks bad will consider MRI, etc. Her updated medication list for this problem includes:    Acetaminophen 500 Mg Tabs (Acetaminophen) .Marland Kitchen... 2 tab by mouth every 6 hours as needed, max dose 4000mg /24h    Aspirin 81 Mg Tbec (Aspirin) .Marland Kitchen... Take one tablet by mouth daily  Orders: T-Cervical Spine Comp 4 Views (72050TC)  Discussed exercises and use of moist heat or cold and medication.   Problem # 2:  CHEST PAIN (ICD-786.50) Assessment: New this sounds like it could be a rheum dz. like PMR, inflammatory myopathy, etc so will look at some inflammatory markers Orders: Venipuncture (84132) TLB-CBC  Platelet - w/Differential (85025-CBCD) TLB-CK Total Only(Creatine Kinase/CPK) (82550-CK) T-D-Dimer Fibrin Derivatives Quantitive (44010-27253) TLB-CRP-High Sensitivity (C-Reactive Protein) (86140-FCRP) TLB-Sedimentation Rate (ESR) (85652-ESR)  Problem # 3:  HYPERTENSION, UNSPECIFIED (ICD-401.9) Assessment: Improved  Her updated medication list for this problem includes:    Metoprolol Succinate 50 Mg Xr24h-tab (Metoprolol succinate) .Marland Kitchen... Take 1 1/2 tablet daily    Amlodipine Besylate 5 Mg Tabs (Amlodipine besylate) .Marland Kitchen... Take 1 1/2 tablet daily    Lisinopril 20 Mg Tabs (Lisinopril) .Marland Kitchen... Take one tablet by mouth daily  Orders: Venipuncture (66440) TLB-CBC Platelet - w/Differential (85025-CBCD) TLB-CK Total Only(Creatine Kinase/CPK) (82550-CK) T-D-Dimer Fibrin Derivatives Quantitive (34742-59563) TLB-CRP-High Sensitivity (C-Reactive Protein) (86140-FCRP) TLB-Sedimentation Rate (ESR) (85652-ESR)  BP today: 130/68 Prior BP: 141/74 (03/13/2010)  Labs Reviewed: K+: 4.1 (12/11/2009) Creat: : 0.8 (12/11/2009)   Chol: 140 (02/03/2010)   HDL: 53.60 (02/03/2010)   LDL: 70 (02/03/2010)   TG: 81.0 (02/03/2010)  Complete Medication List: 1)  Metoprolol Succinate 50 Mg Xr24h-tab (Metoprolol succinate) .... Take 1 1/2 tablet daily 2)  Nitrostat 0.4 Mg Subl (Nitroglycerin) .Marland Kitchen.. 1 tablet under tongue at onset of chest pain; you may repeat every 5 minutes for up to 3 doses. 3)  Acetaminophen 500 Mg Tabs (Acetaminophen) .... 2 tab by mouth every 6 hours as needed, max dose 4000mg /24h 4)  Aspirin 81 Mg Tbec (Aspirin) .... Take one tablet by mouth daily 5)  Plavix 75 Mg Tabs (Clopidogrel bisulfate) .... Take 1 tablet by mouth once a day 6)  Amlodipine Besylate 5 Mg Tabs (Amlodipine besylate) .... Take 1 1/2 tablet daily 7)  Crestor 10 Mg Tabs (Rosuvastatin calcium) .... Take one tablet by mouth daily. 8)  Lisinopril 20 Mg Tabs (Lisinopril) .... Take one tablet by mouth daily 9)  Protonix 40 Mg Tbec  (Pantoprazole sodium) .... Take 1 tablet by mouth once a day  Patient Instructions: 1)  Please schedule a follow-up appointment in 2 weeks.

## 2010-12-02 NOTE — Letter (Signed)
Summary: Results Follow-up Letter  [facility]  [address] [place], [place] [address]   Phone: [phone]  Fax: [phone]    04/03/2010  [address] [place], [place]  [address]  Dear Ms. Guimont,   The following are the results of your recent test(s):  Test     Result     Neck xrays     severe arthritis Inflammation test   elevated CBC       normal   _________________________________________________________  Please call for an appointment soon _________________________________________________________ _________________________________________________________ _________________________________________________________  Sincerely,  Sanda Linger MD [facility]

## 2010-12-02 NOTE — Assessment & Plan Note (Signed)
Summary: 3wk f/u    Visit Type:  3 wk f/u Referring Provider:  Vikki Ports A. Lescher MD Primary Provider:  Newt Lukes MD  CC:  no cardiac complaints today.  History of Present Illness: Feels pretty good.  Does not feel like she can do too much.  Nothing major happening.   Everyone once in a while feels what she thinks is atrial fibrillation.  Will see Dr. Everardo All in late fall, and repeat CT in six months.  She is going in July for about five weeks.     Current Medications (verified): 1)  Metoprolol Succinate 50 Mg Xr24h-Tab (Metoprolol Succinate) .... Take 1 1/2 Tablet Daily 2)  Nitrostat 0.4 Mg Subl (Nitroglycerin) .Marland Kitchen.. 1 Tablet Under Tongue At Onset of Chest Pain; You May Repeat Every 5 Minutes For Up To 3 Doses. 3)  Acetaminophen 500 Mg Tabs (Acetaminophen) .... 2 Tab By Mouth Every 6 Hours As Needed, Max Dose 4000mg /24h 4)  Aspirin 81 Mg Tbec (Aspirin) .... Take One Tablet By Mouth Daily 5)  Plavix 75 Mg Tabs (Clopidogrel Bisulfate) .... Take 1 Tablet By Mouth Once A Day 6)  Amlodipine Besylate 5 Mg Tabs (Amlodipine Besylate) .... Take 1 1/2 Tablet Daily 7)  Crestor 10 Mg Tabs (Rosuvastatin Calcium) .... Take One Tablet By Mouth Daily. 8)  Lisinopril 20 Mg Tabs (Lisinopril) .... Take One Tablet By Mouth Daily 9)  Protonix 40 Mg Tbec (Pantoprazole Sodium) .... Take 1 Tablet By Mouth Once A Day  Allergies (verified): No Known Drug Allergies  Vital Signs:  Patient profile:   70 year old female Height:      63.5 inches Weight:      209 pounds BMI:     36.57 Pulse rate:   64 / minute Pulse rhythm:   regular BP sitting:   124 / 70  (left arm) Cuff size:   large  Vitals Entered By: Danielle Rankin, CMA (April 28, 2010 9:49 AM)  Physical Exam  General:  Well developed, well nourished, in no acute distress. Head:  normocephalic and atraumatic Eyes:  PERRLA/EOM intact; conjunctiva and lids normal. Lungs:  Clear bilaterally to auscultation and percussion. Heart:  Regular Rhythm.   Normal S1 and S2.  No murmur.   Abdomen:  Bowel sounds positive; abdomen soft and non-tender without masses, organomegaly, or hernias noted. No hepatosplenomegaly. Pulses:  pulses normal in all 4 extremities Extremities:  No clubbing or cyanosis. Neurologic:  Alert and oriented x 3.   PPM Specifications Following MD:  Lewayne Bunting, MD     PPM Vendor:  [employer]     PPM Model Number:  [id number]     PPM Serial Number:  [id number] PPM DOI:  04/03/2010     PPM Implanting MD:  Lewayne Bunting, MD  Lead 1    Location: [facility]     DOI: 04/03/2010     Model #: [id number]     Serial #: [id number]     Status: active Lead 2    Location: [facility]     DOI: 04/03/2010     Model #: [id number]     Serial #: [id number]     Status: active  Magnet Response Rate:  BOL 100 ERI  85  Indications:  Brady with pauses   Parameters Mode:  DDD     Lower Rate Limit:  60     Upper Rate Limit:  100 Paced AV Delay:  300     Sensed AV Delay:  300  Impression & Recommendations:  Problem # 1:  CORONARY ARTERY DISEASE (ICD-414.00)  stable at this point in time.  No recurrent symptoms at this point in time.   Her updated medication list for this problem includes:    Metoprolol Succinate 50 Mg Xr24h-tab (Metoprolol succinate) .Marland Kitchen... Take 1 1/2 tablet daily    Nitrostat 0.4 Mg Subl (Nitroglycerin) .Marland Kitchen... 1 tablet under tongue at onset of chest pain; you may repeat every 5 minutes for up to 3 doses.    Aspirin 81 Mg Tbec (Aspirin) .Marland Kitchen... Take one tablet by mouth daily    Plavix 75 Mg Tabs (Clopidogrel bisulfate) .Marland Kitchen... Take 1 tablet by mouth once a day    Amlodipine Besylate 5 Mg Tabs (Amlodipine besylate) .Marland Kitchen... Take 1 1/2 tablet daily    Lisinopril 20 Mg Tabs (Lisinopril) .Marland Kitchen... Take one tablet by mouth daily  Orders: TLB-BMP (Basic Metabolic Panel-BMET) (80048-METABOL)  Problem # 2:  HYPERCHOLESTEROLEMIA  IIA (ICD-272.0)  on medical therapy. Her updated medication list for this problem includes:    Crestor 10 Mg Tabs (Rosuvastatin calcium) .Marland Kitchen...  Take one tablet by mouth daily.  Orders: TLB-BMP (Basic Metabolic Panel-BMET) (80048-METABOL)  Problem # 3:  PAROXYSMAL ATRIAL FIBRILLATION (ICD-427.31)  long discussion again regarding issue of stroke risk, drug risk, options.  Her updated medication list for this problem includes:    Metoprolol Succinate 50 Mg Xr24h-tab (Metoprolol succinate) .Marland Kitchen... Take 1 1/2 tablet daily    Aspirin 81 Mg Tbec (Aspirin) .Marland Kitchen... Take one tablet by mouth daily    Plavix 75 Mg Tabs (Clopidogrel bisulfate) .Marland Kitchen... Take 1 tablet by mouth once a day  Orders: TLB-BMP (Basic Metabolic Panel-BMET) (80048-METABOL)  Problem # 4:  HYPERTENSION, UNSPECIFIED (ICD-401.9)  well controlled.  Her updated medication list for this problem includes:    Metoprolol Succinate 50 Mg Xr24h-tab (Metoprolol succinate) .Marland Kitchen... Take 1 1/2 tablet daily    Aspirin 81 Mg Tbec (Aspirin) .Marland Kitchen... Take one tablet by mouth daily    Amlodipine Besylate 5 Mg Tabs (Amlodipine besylate) .Marland Kitchen... Take 1 1/2 tablet daily    Lisinopril 20 Mg Tabs (Lisinopril) .Marland Kitchen... Take one tablet by mouth daily  Orders: TLB-BMP (Basic Metabolic Panel-BMET) (80048-METABOL)   Patient Instructions: 1)  Your physician recommends that you schedule a follow-up appointment in: 3 MONTHS 2)  Your physician recommends that you have lab work today: BMP 3)  Your physician recommends that you continue on your current medications as directed. Please refer to the Current Medication list given to you today.

## 2010-12-02 NOTE — Assessment & Plan Note (Signed)
Summary: EPH   Visit Type:  Post-hospital Referring Provider:  Raenette Rover. Lescher MD Primary Provider:  Newt Lukes MD   History of Present Illness: Admitted to the hospital for episodes of near syncope.  Found to have PAF, and then long pauses.  Was seen by EP, and pace inserted.  Doing well since discharge from the hospital.  Has long DES (times 3) in LAD from AMI, then LIMA to LAD and SVG to RCA.   As such, still on DAPT.  Coumadin has been witheld.  Reviewed both CHADS-2 and CHADS-Vasc scoring with estimated stroke rate of 2.8-4.0%.  Reviewed in detail with patient and husband.  Thyroid aspiration was benign.  Cervical spine films results also reviewed.    Current Medications (verified): 1)  Metoprolol Succinate 50 Mg Xr24h-Tab (Metoprolol Succinate) .... Take 1 1/2 Tablet Daily 2)  Nitrostat 0.4 Mg Subl (Nitroglycerin) .Marland Kitchen.. 1 Tablet Under Tongue At Onset of Chest Pain; You May Repeat Every 5 Minutes For Up To 3 Doses. 3)  Acetaminophen 500 Mg Tabs (Acetaminophen) .... 2 Tab By Mouth Every 6 Hours As Needed, Max Dose 4000mg /24h 4)  Aspirin 81 Mg Tbec (Aspirin) .... Take One Tablet By Mouth Daily 5)  Plavix 75 Mg Tabs (Clopidogrel Bisulfate) .... Take 1 Tablet By Mouth Once A Day 6)  Amlodipine Besylate 5 Mg Tabs (Amlodipine Besylate) .... Take 1 1/2 Tablet Daily 7)  Crestor 10 Mg Tabs (Rosuvastatin Calcium) .... Take One Tablet By Mouth Daily. 8)  Lisinopril 20 Mg Tabs (Lisinopril) .... Take One Tablet By Mouth Daily 9)  Protonix 40 Mg Tbec (Pantoprazole Sodium) .... Take 1 Tablet By Mouth Once A Day  Allergies (verified): No Known Drug Allergies  Vital Signs:  Patient profile:   70 year old female Height:      63.5 inches Weight:      207.25 pounds BMI:     36.27 Pulse rate:   60 / minute Pulse rhythm:   regular Resp:     18 per minute BP sitting:   130 / 70  (left arm) Cuff size:   large  Vitals Entered By: Vikki Ports (April 07, 2010 11:47 AM)  Physical  Exam  General:  Well developed, well nourished, in no acute distress. Head:  normocephalic and atraumatic Eyes:  PERRLA/EOM intact; conjunctiva and lids normal. Lungs:  Clear bilaterally to auscultation and percussion. Heart:  Non-displaced PMI, chest non-tender; regular rate and rhythm, S1, S2 without murmurs, rubs or gallops.  Abdomen:  Bowel sounds positive; abdomen soft and non-tender without masses, organomegaly, or hernias noted. No hepatosplenomegaly. Pulses:  pulses normal in all 4 extremities Extremities:  No clubbing or cyanosis. Neurologic:  Alert and oriented x 3.   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  despite CABG, given length of stents, reluctant to stop DAPT, at least for one year.  As a result, and CHADS 2 of 1, elected to remain on DAPT.  Her updated medication list for this problem includes:    Metoprolol Succinate 50 Mg Xr24h-tab (Metoprolol succinate) .Marland Kitchen... Take 1 1/2 tablet daily    Nitrostat 0.4 Mg Subl (Nitroglycerin) .Marland Kitchen... 1 tablet under tongue at onset of chest pain; you may repeat every 5 minutes for up to 3 doses.    Aspirin 81 Mg Tbec (Aspirin) .Marland Kitchen... Take one tablet by mouth daily  Plavix 75 Mg Tabs (Clopidogrel bisulfate) .Marland Kitchen... Take 1 tablet by mouth once a day    Amlodipine Besylate 5 Mg Tabs (Amlodipine besylate) .Marland Kitchen... Take 1 1/2 tablet daily    Lisinopril 20 Mg Tabs (Lisinopril) .Marland Kitchen... Take one tablet by mouth daily  Problem # 2:  PAROXYSMAL ATRIAL FIBRILLATION (ICD-427.31) Now on DAPT.  Has dual chamber pacer in now.  Followup soon with Dr. Ladona Ridgel  Her updated medication list for this problem includes:    Metoprolol Succinate 50 Mg Xr24h-tab (Metoprolol succinate) .Marland Kitchen... Take 1 1/2 tablet daily    Aspirin 81 Mg Tbec (Aspirin) .Marland Kitchen... Take one tablet by mouth daily    Plavix 75 Mg Tabs (Clopidogrel bisulfate) .Marland Kitchen... Take 1  tablet by mouth once a day  Her updated medication list for this problem includes:    Metoprolol Succinate 50 Mg Xr24h-tab (Metoprolol succinate) .Marland Kitchen... Take 1 1/2 tablet daily    Aspirin 81 Mg Tbec (Aspirin) .Marland Kitchen... Take one tablet by mouth daily    Plavix 75 Mg Tabs (Clopidogrel bisulfate) .Marland Kitchen... Take 1 tablet by mouth once a day  Problem # 3:  NECK PAIN, ACUTE (ICD-723.1) see results of cervical films. Followup with Dr. Yetta Barre recommended.  Problem # 4:  HYPERCHOLESTEROLEMIA  IIA (ICD-272.0) continue statin. Her updated medication list for this problem includes:    Crestor 10 Mg Tabs (Rosuvastatin calcium) .Marland Kitchen... Take one tablet by mouth daily.  Her updated medication list for this problem includes:    Crestor 10 Mg Tabs (Rosuvastatin calcium) .Marland Kitchen... Take one tablet by mouth daily.  Patient Instructions: 1)  Your physician recommends that you schedule a follow-up appointment in: 3 weeks

## 2010-12-02 NOTE — Assessment & Plan Note (Signed)
Summary: eph/appt is 9:15/ gd   Visit Type:  EPH Primary Provider:  No PCP at times  CC:  sob...denies any cp or edema.  History of Present Illness: Has fairly good energy aand able to get around pretty well.  Has some soreness, and some dyspnea on the way up the stairs.  All in all feels about as good as she should be.  Saw Dr. Dorris Fetch last week and BP was up.  Had PAF and was placed on warfarin.  Does not feel she has had AF since dc.  Current Medications (verified): 1)  Metoprolol Tartrate 25 Mg Tabs (Metoprolol Tartrate) .... Take 1 1/2 Tablet By Mouth Twice A Day 2)  Nitrostat 0.4 Mg Subl (Nitroglycerin) .Marland Kitchen.. 1 Tablet Under Tongue At Onset of Chest Pain; You May Repeat Every 5 Minutes For Up To 3 Doses. 3)  Acetaminophen 500 Mg Tabs (Acetaminophen) .... Take Two Times A Day As Needed 4)  Aspirin Ec 325 Mg Tbec (Aspirin) .... Take One Tablet By Mouth Daily 5)  Plavix 75 Mg Tabs (Clopidogrel Bisulfate) .... Take 1 Tablet By Mouth Once A Day 6)  Amlodipine Besylate 5 Mg Tabs (Amlodipine Besylate) .... Take One Tablet By Mouth Daily 7)  Crestor 20 Mg Tabs (Rosuvastatin Calcium) .... Take 1 Tab By Mouth At Bedtime 8)  Lisinopril 20 Mg Tabs (Lisinopril) .... Take One Tablet By Mouth Daily  Allergies (verified): No Known Drug Allergies  Past History:  Past Medical History: Last updated: 12/05/2009 Current Problems:  CAD, ARTERY BYPASS GRAFT (ICD-414.04) PAROXYSMAL ATRIAL FIBRILLATION (ICD-427.31) MYOCARDIAL INFARCTION, ACUTE, ANTERIOR WALL (ICD-410.10) HYPERTENSION, UNSPECIFIED (ICD-401.9) OBESITY (ICD-278.00)  Past Surgical History:  angioplasty  and stenting of her LAD  coronary  bypass grafting x2 on November 11, 2009  cholecystectomy  appendectomy  right breast lumpectomy (benign).    PREOPERATIVE DIAGNOSIS:  Severe two-vessel coronary artery disease   status post ST elevation myocardial infarction.      POSTOPERATIVE DIAGNOSIS:  Severe two-vessel coronary artery  disease   status post ST elevation myocardial infarction.      PROCEDURE:  Median sternotomy, off-pump coronary artery bypass grafting   x2 (saphenous vein graft in distal right coronary, left internal mammary   artery to LAD), endoscopic vein harvest right thigh.      SURGEON:  Salvatore Decent. Dorris Fetch, MD   Vital Signs:  Patient profile:   70 year old female Height:      63.5 inches Weight:      212 pounds BMI:     37.10 Pulse rate:   65 / minute Pulse rhythm:   irregular BP sitting:   130 / 74  (left arm) Cuff size:   large  Vitals Entered By: Danielle Rankin, CMA (December 11, 2009 8:51 AM)  Physical Exam  General:  Well developed, well nourished, in no acute distress. Head:  normocephalic and atraumatic Neck:  Neck supple, no JVD. No masses, thyromegaly or abnormal cervical nodes. Chest Wall:  Sternotomy healing. Lungs:  Clear bilaterally to auscultation and percussion. Heart:  PMI non displaced.  Normal S1 and S2.  Positive S4.   SEM, no DM. Abdomen:  Bowel sounds positive; abdomen soft and non-tender without masses, organomegaly, or hernias noted. No hepatosplenomegaly. Pulses:  pulses normal in all 4 extremities Extremities:  No clubbing or cyanosis. Neurologic:  Alert and oriented x 3.   EKG  Procedure date:  12/11/2009  Findings:      NSR.   Anterior MI of indeterminate age.  Non  specific ST and T abnormalities.    Cardiac Cath  Procedure date:  11/03/2009  Findings:       CONCLUSIONS:   1. Anterior wall myocardial infarction presenting as acute pulmonary       edema, and shortness of breath for approximately 3 weeks with       successful percutaneous stenting of the mid and distal left       anterior descending artery.   2. Moderately high-grade residual disease involving the ostium of the       left anterior descending artery extending up to the left main       circumflex interface of about 60% which by intravascular ultrasound       is 3.02 sq. mm.   3.  No high-grade residual critical stenosis of the large circumflex       artery.   4. Total occlusion of the right coronary artery in its midportion with       collateralization of the distal vessel.      Echocardiogram  Procedure date:  11/05/2009  Findings:       - Left ventricle: The cavity size was normal. Wall thickness was     increased in a pattern of mild LVH. Systolic function was mildly     to moderately reduced. The estimated ejection fraction was in the     range of 40% to 45%. The mid anteroseptum and mid inferoseptum,     the apical septum, and the true apex appeared severely hypokinetic     to akinetic. The anterolateral, posterior, and inferior walls     appeared to function essentially normally. The anterior wall was     not well visualized but did not seemsignificantly hypokinetic.     Features are consistent with a pseudonormal left ventricular     filling pattern, with concomitant abnormal relaxation and     increased filling pressure (grade 2 diastolic dysfunction).     E/medial E' > 15 suggests LV end diastolic pressure > 20 mmHg.   - Aortic valve: There was no stenosis.   - Mitral valve: Mild regurgitation.   - Left atrium: The atrium was mildly dilated.   - Right ventricle: The cavity size was normal. Systolic function was     normal.   - Pulmonary arteries: PA peak pressure: 32mm Hg (S).  CXR  Procedure date:  12/02/2009  Findings:       CHEST - 2 VIEW    Comparison: 11/16/2009    Findings: Trachea is midline.  Heart size is grossly stable.   Sternotomy wires are unchanged in position.  Small left pleural   effusion.  Decreasing air space disease in the lingula and left   lower lobe.  No pneumothorax.  Right lung is clear.    IMPRESSION:    1.  Small left pleural effusion.   2.  Decreasing lingular and left lower lobe air space disease.  Impression & Recommendations:  Problem # 1:  CAD, ARTERY BYPASS GRAFT (ICD-414.04) Appears stable.  Had  post op AF, but CHADS is low, no recurrent symptoms, on DAPT currently, therefore will stop warfarin at present. The following medications were removed from the medication list:    Warfarin Sodium 5 Mg Tabs (Warfarin sodium) .Marland Kitchen... Take as directed by coumadin clinic. Her updated medication list for this problem includes:    Metoprolol Tartrate 25 Mg Tabs (Metoprolol tartrate) .Marland Kitchen... Take 1 1/2 tablet by mouth twice a day    Nitrostat 0.4  Mg Subl (Nitroglycerin) .Marland Kitchen... 1 tablet under tongue at onset of chest pain; you may repeat every 5 minutes for up to 3 doses.    Aspirin Ec 325 Mg Tbec (Aspirin) .Marland Kitchen... Take one tablet by mouth daily    Plavix 75 Mg Tabs (Clopidogrel bisulfate) .Marland Kitchen... Take 1 tablet by mouth once a day    Amlodipine Besylate 5 Mg Tabs (Amlodipine besylate) .Marland Kitchen... Take one tablet by mouth daily    Lisinopril 20 Mg Tabs (Lisinopril) .Marland Kitchen... Take one tablet by mouth daily  Orders: Holter Monitor (Holter Monitor) TLB-BMP (Basic Metabolic Panel-BMET) (80048-METABOL) TLB-CBC Platelet - w/Differential (85025-CBCD) TLB-Hepatic/Liver Function Pnl (80076-HEPATIC) TLB-Lipid Panel (80061-LIPID)  Problem # 2:  PAROXYSMAL ATRIAL FIBRILLATION (ICD-427.31) After next visit will do holter to confirm lack of recurrent arrythmia.  Would like to eliminate triple anticoag treatment, and yet with DES would like to continue DAPT.  Strategies reviewed.  CHADS about 1.5-2.0. The following medications were removed from the medication list:    Warfarin Sodium 5 Mg Tabs (Warfarin sodium) .Marland Kitchen... Take as directed by coumadin clinic. Her updated medication list for this problem includes:    Metoprolol Tartrate 25 Mg Tabs (Metoprolol tartrate) .Marland Kitchen... Take 1 1/2 tablet by mouth twice a day    Aspirin Ec 325 Mg Tbec (Aspirin) .Marland Kitchen... Take one tablet by mouth daily    Plavix 75 Mg Tabs (Clopidogrel bisulfate) .Marland Kitchen... Take 1 tablet by mouth once a day  Problem # 3:  HYPERTENSION, UNSPECIFIED (ICD-401.9) Will adjust  appropriately.  Need better control.   The following medications were removed from the medication list:    Furosemide 40 Mg Tabs (Furosemide) .Marland Kitchen... Take one tablet by mouth daily. Her updated medication list for this problem includes:    Metoprolol Tartrate 25 Mg Tabs (Metoprolol tartrate) .Marland Kitchen... Take 1 1/2 tablet by mouth twice a day    Aspirin Ec 325 Mg Tbec (Aspirin) .Marland Kitchen... Take one tablet by mouth daily    Amlodipine Besylate 5 Mg Tabs (Amlodipine besylate) .Marland Kitchen... Take one tablet by mouth daily    Lisinopril 20 Mg Tabs (Lisinopril) .Marland Kitchen... Take one tablet by mouth daily  Patient Instructions: 1)  Your physician recommends that you schedule a follow-up appointment in:  1 WEEK 2)  Your physician has recommended you make the following change in your medication: INCREASE LISINOPRIL TO 20MG  once daily,ADD AMLODIPINE 5MG  QD 3)  Your physician has recommended that you wear a holter monitor.  Holter monitors are medical devices that record the heart's electrical activity. Doctors most often use these monitors to diagnose arrhythmias. Arrhythmias are problems with the speed or rhythm of the heartbeat. The monitor is a small, portable device. You can wear one while you do your normal daily activities. This is usually used to diagnose what is causing palpitations/syncope (passing out). 4)  Your physician recommends that you return for lab work in: TODAY--CBC,BMET,LIVER AND LIPIDS Prescriptions: LISINOPRIL 20 MG TABS (LISINOPRIL) Take one tablet by mouth daily  #30 x 10   Entered by:   Ledon Snare, RN   Authorized by:   Ronaldo Miyamoto, MD, [facility]   Signed by:   Ledon Snare, RN on 12/11/2009   Method used:   Electronically to        [facility] [phone]* (retail)       [address]       [place], [place]  [address]       Ph: [phone] or [phone]       Fax: [phone]  RxID:   [id number] AMLODIPINE BESYLATE 5 MG TABS (AMLODIPINE BESYLATE) Take one tablet by mouth daily  #30 x 10    Entered by:   Ledon Snare, RN   Authorized by:   Ronaldo Miyamoto, MD, [facility]   Signed by:   Ledon Snare, RN on 12/11/2009   Method used:   Electronically to        [facility] [phone]* (retail)       [address]       [place], [place]  [address]       Ph: [phone] or [phone]       Fax: [phone]   RxID:   [id number]

## 2010-12-02 NOTE — Medication Information (Signed)
Summary: Resume Cardiac Rehab  Resume Cardiac Rehab   Imported By: Debby Freiberg 04/30/2010 14:43:04  _____________________________________________________________________  External Attachment:    Type:   Image     Comment:   External Document

## 2010-12-02 NOTE — Assessment & Plan Note (Signed)
Summary: discuss arthritis/cd   Vital Signs:  Patient profile:   70 year old female Height:      63.5 inches (161.29 cm) Weight:      225.0 pounds (102.27 kg) O2 Sat:      98 % on Room air Temp:     98.4 degrees F (36.89 degrees C) oral Pulse rate:   66 / minute BP sitting:   128 / 82  (left arm) Cuff size:   large  Vitals Entered By: Orlan Leavens RMA (September 19, 2010 8:57 AM)  O2 Flow:  Room air CC: Discuss arthritis Is Patient Diabetic? No Pain Assessment Patient in pain? no        Primary Care Provider:  Newt Lukes MD  CC:  Discuss arthritis.  History of Present Illness:  here for inc arthritis pain symptoms   OA -B shoulder pain + neck pain seen here 04/02/10 for same - told xray neck showed severe arthritis - c5-6 mod forminal stenosis - prev relieved with ibuprofen but unable to take NSAIDs due to CAD (MI 10/2009) now intrerested in spine eval, PT, surg, shots or meds - +mild-mod pain in neck and shoulders - wax/wane intensity -  no weakness in hands or arms but occ numbness L>R  A fib - onset 04/02/10 - felt dizzy and weak - to ER - having tach-brady -  underwent PPM placement for same - now feels better - no dizzy, sob, cp or palp -  lung nodule - ct done 04/2010 - results reviewed - no hemopytsis or cough or sputum - quit smoking 10/2009  Clinical Review Panels:  Prevention   Last Mammogram:  No specific mammographic evidence of malignancy.   (11/02/2005)   Last Pap Smear:  Interpretation/Result:Negative for intraepithelial Lesion or Malignancy.    (11/02/2005)  Immunizations   Last Tetanus Booster:  Historical (11/02/2005)   Last Flu Vaccine:  Historical (11/03/2009)   Last Pneumovax:  Historical (11/03/2009)  Lipid Management   Cholesterol:  140 (02/03/2010)   LDL (bad choesterol):  70 (02/03/2010)   HDL (good cholesterol):  53.60 (02/03/2010)  CBC   WBC:  9.4 (04/02/2010)   RBC:  4.76 (04/02/2010)   Hgb:  13.6 (04/02/2010)   Hct:   40.0 (04/02/2010)   Platelets:  302.0 (04/02/2010)   MCV  84.0 (04/02/2010)   MCHC  34.0 (04/02/2010)   RDW  16.5 (04/02/2010)   PMN:  64.0 (04/02/2010)   Lymphs:  26.1 (04/02/2010)   Monos:  9.3 (04/02/2010)   Eosinophils:  0.3 (04/02/2010)   Basophil:  0.3 (04/02/2010)  Complete Metabolic Panel   Glucose:  104 (04/28/2010)   Sodium:  142 (04/28/2010)   Potassium:  4.5 (04/28/2010)   Chloride:  102 (04/28/2010)   CO2:  31 (04/28/2010)   BUN:  24 (04/28/2010)   Creatinine:  0.8 (04/28/2010)   Albumin:  3.9 (02/03/2010)   Total Protein:  7.0 (02/03/2010)   Calcium:  9.7 (04/28/2010)   Total Bili:  0.4 (02/03/2010)   Alk Phos:  86 (02/03/2010)   SGPT (ALT):  16 (02/03/2010)   SGOT (AST):  15 (02/03/2010)   Current Medications (verified): 1)  Metoprolol Succinate 50 Mg Xr24h-Tab (Metoprolol Succinate) .... Take 1 1/2 Tablet Daily 2)  Nitrostat 0.4 Mg Subl (Nitroglycerin) .Marland Kitchen.. 1 Tablet Under Tongue At Onset of Chest Pain; You May Repeat Every 5 Minutes For Up To 3 Doses. 3)  Acetaminophen 500 Mg Tabs (Acetaminophen) .... 2 Tab By Mouth Every 6 Hours As Needed,  Max Dose 4000mg /24h 4)  Aspirin 81 Mg Tbec (Aspirin) .... Take One Tablet By Mouth Daily 5)  Plavix 75 Mg Tabs (Clopidogrel Bisulfate) .... Take 1 Tablet By Mouth Once A Day 6)  Amlodipine Besylate 5 Mg Tabs (Amlodipine Besylate) .... Take 1 1/2 Tablet Daily 7)  Crestor 10 Mg Tabs (Rosuvastatin Calcium) .... Take One Tablet By Mouth Daily. 8)  Lisinopril 20 Mg Tabs (Lisinopril) .... Take One Tablet By Mouth Daily 9)  Protonix 40 Mg Tbec (Pantoprazole Sodium) .... Take 1 Tablet By Mouth Once A Day 10)  Coq10 100 Mg Caps (Coenzyme Q10) .... Take 1 By Mouth Once Daily  Allergies (verified): No Known Drug Allergies  Past History:  Past Medical History: CAD s/p 2v CABG 11/2009 s/p AMI ant wall PAF - no anticoag s/p PPM 04/02/10 - SSS hypertension obesity Depression thyroid nodule 12/2009 CT chest - abn [place] 01/2010 - bx done  - neg RUL lung nodule - 04/2010 ct f/u - stable - f/u 10/2010   MD roster: cards - [facility] endo - ellison  Review of Systems       The patient complains of weight gain.  The patient denies fever, chest pain, and headaches.    Physical Exam  General:  overweight-appearing.  nontoxic - alert, well-developed, well-nourished, and cooperative to examination.    Lungs:  normal respiratory effort, no intercostal retractions or use of accessory muscles; normal breath sounds bilaterally - no crackles and no wheezes.    Heart:  normal rate, regular rhythm, no murmur, and no rub. BLE without edema.    Msk:  normal ROM, no joint tenderness, no joint swelling, no joint warmth, no redness over joints, no joint deformities, no joint instability, no crepitation, and no muscle atrophy.   Neurologic:  alert & oriented X3 and cranial nerves II-XII symetrically intact.  strength normal in all extremities, sensation intact to light touch, and gait normal. speech fluent without dysarthria or aphasia; follows commands with good comprehension.    Impression & Recommendations:  Problem # 1:  NECK PAIN, ACUTE (ICD-723.1)  prior c-spine xray with DDD summer 2011 - inc symptoms with radiculopathy - refer to Nsurg for further eval and x, poss PT or steroid injection - erx for muscle relax - done - reminded to take tylenol! Her updated medication list for this problem includes:    Acetaminophen 500 Mg Tabs (Acetaminophen) .Marland Kitchen... 2 tab by mouth every 6 hours as needed, max dose 4000mg /24h    Aspirin 81 Mg Tbec (Aspirin) .Marland Kitchen... Take one tablet by mouth daily    Methocarbamol 500 Mg Tabs (Methocarbamol) .Marland Kitchen... 1 by mouth every 8 hour as needed for mescle spasm pain  Orders: Prescription Created Electronically [phone]) Neurosurgeon Referral (Neurosurgeon)  Problem # 2:  LUNG NODULE (ICD-518.89)  R mid lung nonsp area 12/2009 CT - reck 04/2010 stable -report reviewed with pt -  recheck due 10/2010 as high risk (recent  tobacco cessation s/p MI/CABG 11/2009) - pt aware and i will schedule at that time  Orders: Radiology Referral (Radiology)  Problem # 3:  GOITER, MULTINODULAR (ICD-241.1) s/p bx summer 2011 - benign - due for 6 mo endo f/u now - pt to sched  Problem # 4:  CORONARY ARTERY DISEASE (ICD-414.00)  Her updated medication list for this problem includes:    Metoprolol Succinate 50 Mg Xr24h-tab (Metoprolol succinate) .Marland Kitchen... Take 1 1/2 tablet daily    Nitrostat 0.4 Mg Subl (Nitroglycerin) .Marland Kitchen... 1 tablet under tongue at onset of chest pain;  you may repeat every 5 minutes for up to 3 doses.    Aspirin 81 Mg Tbec (Aspirin) .Marland Kitchen... Take one tablet by mouth daily    Plavix 75 Mg Tabs (Clopidogrel bisulfate) .Marland Kitchen... Take 1 tablet by mouth once a day    Amlodipine Besylate 5 Mg Tabs (Amlodipine besylate) .Marland Kitchen... Take 1 1/2 tablet daily    Lisinopril 20 Mg Tabs (Lisinopril) .Marland Kitchen... Take one tablet by mouth daily  stable on current regimen.  Has multiple DES in LAD, and therefore ideal to remain on DAPT.   Labs Reviewed: Chol: 140 (02/03/2010)   HDL: 53.60 (02/03/2010)   LDL: 70 (02/03/2010)   TG: 81.0 (02/03/2010)  Complete Medication List: 1)  Metoprolol Succinate 50 Mg Xr24h-tab (Metoprolol succinate) .... Take 1 1/2 tablet daily 2)  Nitrostat 0.4 Mg Subl (Nitroglycerin) .Marland Kitchen.. 1 tablet under tongue at onset of chest pain; you may repeat every 5 minutes for up to 3 doses. 3)  Acetaminophen 500 Mg Tabs (Acetaminophen) .... 2 tab by mouth every 6 hours as needed, max dose 4000mg /24h 4)  Aspirin 81 Mg Tbec (Aspirin) .... Take one tablet by mouth daily 5)  Plavix 75 Mg Tabs (Clopidogrel bisulfate) .... Take 1 tablet by mouth once a day 6)  Amlodipine Besylate 5 Mg Tabs (Amlodipine besylate) .... Take 1 1/2 tablet daily 7)  Crestor 10 Mg Tabs (Rosuvastatin calcium) .... Take one tablet by mouth daily. 8)  Lisinopril 20 Mg Tabs (Lisinopril) .... Take one tablet by mouth daily 9)  Protonix 40 Mg Tbec (Pantoprazole  sodium) .... Take 1 tablet by mouth once a day 10)  Coq10 100 Mg Caps (Coenzyme q10) .... Take 1 by mouth once daily 11)  Methocarbamol 500 Mg Tabs (Methocarbamol) .Marland Kitchen.. 1 by mouth every 8 hour as needed for mescle spasm pain  Patient Instructions: 1)  it was good to see you today. 2)  robaxin for muscle relaxant - your prescription has been electronically submitted to your pharmacy. Please take as directed. Contact our office if you believe you're having problems with the medication(s).  3)  also we'll make referral to neurosurg for your neck . Our office will contact you regarding this appointment once made.  4)  cont tylenol - 5)  schedule follow up with dr. Everardo All for thyroid now - (due 6 mo as recommended 03/2010) 6)  will arrange followup CT lungs 10/2010 as planned to followup spot in right upper lobe Prescriptions: METHOCARBAMOL 500 MG TABS (METHOCARBAMOL) 1 by mouth every 8 hour as needed for mescle spasm pain  #40 x 1   Entered and Authorized by:   Newt Lukes MD   Signed by:   Newt Lukes MD on 09/19/2010   Method used:   Electronically to        [phone]* (retail)       [address]       [place], [place]  [address]       Ph: [phone] or [phone]       Fax: [phone]   RxID:   [id number]    Orders Added: 1)  Est. Patient Level IV [36644] 2)  Prescription Created Electronically [[id number]] 3)  Radiology Referral [Radiology] 4)  Neurosurgeon Referral [Neurosurgeon]

## 2010-12-02 NOTE — Procedures (Signed)
Summary: wound check/st jude   Current Medications (verified): 1)  Metoprolol Succinate 50 Mg Xr24h-Tab (Metoprolol Succinate) .... Take 1 1/2 Tablet Daily 2)  Nitrostat 0.4 Mg Subl (Nitroglycerin) .Marland Kitchen.. 1 Tablet Under Tongue At Onset of Chest Pain; You May Repeat Every 5 Minutes For Up To 3 Doses. 3)  Acetaminophen 500 Mg Tabs (Acetaminophen) .... 2 Tab By Mouth Every 6 Hours As Needed, Max Dose 4000mg /24h 4)  Aspirin 81 Mg Tbec (Aspirin) .... Take One Tablet By Mouth Daily 5)  Plavix 75 Mg Tabs (Clopidogrel Bisulfate) .... Take 1 Tablet By Mouth Once A Day 6)  Amlodipine Besylate 5 Mg Tabs (Amlodipine Besylate) .... Take 1 1/2 Tablet Daily 7)  Crestor 10 Mg Tabs (Rosuvastatin Calcium) .... Take One Tablet By Mouth Daily. 8)  Lisinopril 20 Mg Tabs (Lisinopril) .... Take One Tablet By Mouth Daily 9)  Protonix 40 Mg Tbec (Pantoprazole Sodium) .... Take 1 Tablet By Mouth Once A Day  Allergies (verified): No Known Drug Allergies  PPM Specifications Following MD:  Lewayne Bunting, MD     PPM Vendor:  [employer]     PPM Model Number:  [id number]     PPM Serial Number:  [id number] PPM DOI:  04/03/2010     PPM Implanting MD:  Lewayne Bunting, MD  Lead 1    Location: [facility]     DOI: 04/03/2010     Model #: [id number]     Serial #: [id number]     Status: active Lead 2    Location: RV     DOI: 04/03/2010     Model #: [id number]     Serial #: [id number]     Status: active  Magnet Response Rate:  BOL 100 ERI  85  Indications:  [name] with pauses   PPM Follow Up Battery Voltage:  3.11 V     Battery Est. Longevity:  5.7-12.2 yrs       PPM Device Measurements Atrium  Amplitude: 3.0 mV, Impedance: 430 ohms, Threshold: 0.75 V at 0.4 msec Right Ventricle  Amplitude: 12.0 mV, Impedance: 400 ohms, Threshold: 0.75 V at 0.4 msec  Episodes MS Episodes:  3     Percent Mode Switch:  <1%     Ventricular High Rate:  0     Atrial Pacing:  16%     Ventricular Pacing:  <1%  Parameters Mode:  DDD     Lower Rate Limit:  60     Upper Rate  Limit:  100 Paced AV Delay:  300     Sensed AV Delay:  300 Tech Comments:  WOUND CHECK--STERI STRIPS REMOVED.  NO REDNESS OR EDEMA AT SITE. 3 AMS EPISODES--LONGEST WAS 2 MINUTES 8 SECONDS.  PT IS NOT ON COUMADIN.  NORMAL THRESHOLD TESTING.  NO CHANGES MADE. ROV 07-08-10 W/GT. Vella Kohler  April 14, 2010 11:48 AM

## 2010-12-02 NOTE — Miscellaneous (Signed)
Summary: [facility] Progress Note  [facility] Progress Note   Imported By: Roderic Ovens 03/12/2010 11:43:55  _____________________________________________________________________  External Attachment:    Type:   Image     Comment:   External Document

## 2010-12-02 NOTE — Progress Notes (Signed)
Summary: thyroid u/s  Phone Note Call from Patient Call back at Home Phone [phone]   Caller: Patient Reason for Call: Talk to Doctor, Referral Summary of Call: Pt states she saw md last week and was told that he would order thyroid ultrasound. Pt states she has'nt heard anything. Order not in comp. Pls advise Initial call taken by: Orlan Leavens,  Mar 11, 2010 9:30 AM  Follow-up for Phone Call        i ordered.  within the span of a few days, several x ray requests are gone.  i do not know why. Follow-up by: Minus Breeding MD,  Mar 11, 2010 10:35 AM  Additional Follow-up for Phone Call Additional follow up Details #1::        pt informed Additional Follow-up by: Margaret Pyle, CMA,  Mar 11, 2010 10:53 AM

## 2010-12-02 NOTE — Assessment & Plan Note (Signed)
Summary: PER ORDER/DR VL--THY NOD--MEDICARE--STC   Vital Signs:  Patient profile:   70 year old female Height:      63.5 inches (161.29 cm) Weight:      207.75 pounds (94.43 kg) O2 Sat:      96 % on Room air Temp:     97.6 degrees F (36.44 degrees C) oral Pulse rate:   60 / minute BP sitting:   132 / 82  (left arm) Cuff size:   regular  Vitals Entered By: Josph Macho RMA (Mar 06, 2010 2:39 PM)  O2 Flow:  Room air CC: New Endo: Thyroid Nodule/ CF Is Patient Diabetic? No   Referring Provider:  Raenette Rover. Lescher MD Primary Provider:  Newt Lukes MD  CC:  New Endo: Thyroid Nodule/ CF.  History of Present Illness: pt was noted incidentally on a ct of the chest, to have a goiter.  she is unaware of having had and thyroid problem in the past.  she feels well except for hair loss on the head, and associated doe.  Current Medications (verified): 1)  Metoprolol Succinate 50 Mg Xr24h-Tab (Metoprolol Succinate) .... Take 1 1/2 Tablet Daily 2)  Nitrostat 0.4 Mg Subl (Nitroglycerin) .Marland Kitchen.. 1 Tablet Under Tongue At Onset of Chest Pain; You May Repeat Every 5 Minutes For Up To 3 Doses. 3)  Acetaminophen 500 Mg Tabs (Acetaminophen) .... 2 Tab By Mouth Every 6 Hours As Needed, Max Dose 4000mg /24h 4)  Aspirin 81 Mg Tbec (Aspirin) .... Take One Tablet By Mouth Daily 5)  Plavix 75 Mg Tabs (Clopidogrel Bisulfate) .... Take 1 Tablet By Mouth Once A Day 6)  Amlodipine Besylate 5 Mg Tabs (Amlodipine Besylate) .... Take 1 1/2 Tablet Daily 7)  Crestor 10 Mg Tabs (Rosuvastatin Calcium) .... Take One Tablet By Mouth Daily. 8)  Lisinopril 20 Mg Tabs (Lisinopril) .... Take One Tablet By Mouth Daily 9)  Protonix 40 Mg Tbec (Pantoprazole Sodium) .... Take 1 Tablet By Mouth Once A Day  Allergies (verified): No Known Drug Allergies  Past History:  Past Medical History: Last updated: 02/20/2010 CAD s/p 2v CABG 11/2009 s/p AMI ant wall PAF - no  anticoag hypertension obesity Depression thyroid nodule 12/2009 CT chest - abn [place] 01/2010   MD rooster; cards - [facility] end[facility] - (to be Rayven Rettig)  Family History: Reviewed history from 02/20/2010 and no changes required. Significant for premature cardiovascular   disease affecting her father in his 40s.     Family History of Alcoholism/Addiction (parent) Family History of Arthritis (parent) Family History Hypertension (parent, other relative) Cervical cancer (mother)  no goiter, but dtr has hypothyroidism     Social History: Reviewed history from 02/20/2010 and no changes required. 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     Review of Systems       denies headache, hoarseness, visual loss, palpitations, sob, diarrhea, polyuria, myalgias, excessive diaphoresis, numbness, tremor, anxiety, and menopausal sxs. she denies dysphagia.  she has lost 40 lbs, due to her efforts.  she reports rhinorrhea.  she attributes easy bruising to plavix.  Physical Exam  General:  normal appearance.   Head:  mild diffuse alopecia head: no deformity eyes: no periorbital swelling, no proptosis external nose and ears are normal mouth: no lesion seen Neck:  i think i can feel the top of a goiter, but i cann tell details Lungs:  Clear to auscultation bilaterally. Normal respiratory effort.  Heart:  Regular rate  and rhythm without murmurs or gallops noted. Normal S1,S2.   Msk:  muscle bulk and strength are grossly normal.  no obvious joint swelling.  gait is normal and steady  Extremities:  no deformity no edema Neurologic:  cn 2-12 grossly intact.   readily moves all 4's.   sensation is intact to touch on all 4's Skin:  normal texture and temp.  no rash.  not diaphoretic  Cervical Nodes:  No significant adenopathy.  Psych:  Alert and cooperative; normal mood and affect; normal attention span and concentration.   Additional Exam:   tsh=normal ultrasound:  3 complex thyroid nodules      Impression & Recommendations:  Problem # 1:  GOITER, MULTINODULAR (ICD-241.1) usually hereditary  Problem # 2:  DYSPNEA (ICD-786.05) rarely related to #1  Other Orders: Consultation Level III (16109)  Patient Instructions: 1)  i'll request an appointment for you to have ultrasound-guided biopsy of each of the 3 thyroid nodules.  you will be called with a day and time for an appointment 2)  if benign, return here in 6 months. 3)  most of the time, a "lumpy thyroid" will eventually become overactive.  this is usually a slow process, happening over the span of many years.

## 2010-12-02 NOTE — Assessment & Plan Note (Signed)
Summary: ROV   Visit Type:  Follow-up Referring Provider:  Raenette Rover. Lescher MD Primary Provider:  Newt Lukes MD  CC:  No complains.  History of Present Illness: Erica Hood on a long trip, and she got out frequently and walked alot.  Trip was really refreshing.  She feels better.  She is feeling better in generally, but emotionally a roller coaster.  Cardiac wise she is fine.  Not having any afib, she says, per Dr. Ladona Ridgel.   Does not want to take Coumadin and wants to stay on combo of ASA and Plavix.  She thinks she is doing well.  Happy with things the way they are.    Current Medications (verified): 1)  Metoprolol Succinate 50 Mg Xr24h-Tab (Metoprolol Succinate) .... Take 1 1/2 Tablet Daily 2)  Nitrostat 0.4 Mg Subl (Nitroglycerin) .Marland Kitchen.. 1 Tablet Under Tongue At Onset of Chest Pain; You May Repeat Every 5 Minutes For Up To 3 Doses. 3)  Acetaminophen 500 Mg Tabs (Acetaminophen) .... 2 Tab By Mouth Every 6 Hours As Needed, Max Dose 4000mg /24h 4)  Aspirin 81 Mg Tbec (Aspirin) .... Take One Tablet By Mouth Daily 5)  Plavix 75 Mg Tabs (Clopidogrel Bisulfate) .... Take 1 Tablet By Mouth Once A Day 6)  Amlodipine Besylate 5 Mg Tabs (Amlodipine Besylate) .... Take 1 1/2 Tablet Daily 7)  Crestor 10 Mg Tabs (Rosuvastatin Calcium) .... Take One Tablet By Mouth Daily. 8)  Lisinopril 20 Mg Tabs (Lisinopril) .... Take One Tablet By Mouth Daily 9)  Protonix 40 Mg Tbec (Pantoprazole Sodium) .... Take 1 Tablet By Mouth Once A Day  Allergies (verified): No Known Drug Allergies  Vital Signs:  Patient profile:   70 year old female Height:      63.5 inches Weight:      216.50 pounds BMI:     37.89 Pulse rate:   52 / minute Pulse rhythm:   regular Resp:     18 per minute BP sitting:   160 / 74  (left arm) Cuff size:   large  Vitals Entered By: Vikki Ports (July 15, 2010 11:21 AM)  Physical Exam  General:  Well developed, well nourished, in no acute distress. Head:  normocephalic and  atraumatic Lungs:  Clear bilaterally to auscultation and percussion. Heart:  PMII non displaced.  NOrmal S1 and S2.  S4.   Pulses:  pulses normal in all 4 extremities Extremities:  No clubbing or cyanosis. Neurologic:  Alert and oriented x 3.   EKG  Procedure date:  07/15/2010  Findings:      SB.  Non specific ST abnormality.   PPM Specifications Following MD:  Lewayne Bunting, MD     PPM Vendor:  [employer] Jude     PPM Model Number:  [id number]     PPM Serial Number:  [id number] PPM DOI:  04/03/2010     PPM Implanting MD:  Lewayne Bunting, MD  Lead 1    Location: [facility]     DOI: 04/03/2010     Model #: [id number]     Serial #: [id number]     Status: active Lead 2    Location: RV     DOI: 04/03/2010     Model #: [id number]     Serial #: [id number]     Status: active  Magnet Response Rate:  BOL 100 ERI  85  Indications:  [name] with pauses   PPM Follow Up Pacer Dependent:  No      Episodes Coumadin:  No  Parameters Mode:  DDD     Lower Rate Limit:  60     Upper Rate Limit:  100 Paced AV Delay:  300     Sensed AV Delay:  300  Impression & Recommendations:  Problem # 1:  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  Thorough discussion, and she wants DAPT instead of warfarin.  She understands current issues, and we will follow closely.  Her updated medication list for this problem includes:    Metoprolol Succinate 50 Mg Xr24h-tab (Metoprolol succinate) .Marland Kitchen... Take 1 1/2 tablet daily    Aspirin 81 Mg Tbec (Aspirin) .Marland Kitchen... Take one tablet by mouth daily    Plavix 75 Mg Tabs (Clopidogrel bisulfate) .Marland Kitchen... Take 1 tablet by mouth once a day  Problem # 2:  CORONARY ARTERY DISEASE (ICD-414.00) stable on current regimen.  Has multiple DES in LAD, and therefore ideal to remain on DAPT.  Her updated medication list for this problem includes:    Metoprolol Succinate 50 Mg Xr24h-tab (Metoprolol succinate) .Marland Kitchen... Take 1 1/2 tablet daily    Nitrostat 0.4 Mg Subl (Nitroglycerin) .Marland Kitchen... 1 tablet  under tongue at onset of chest pain; you may repeat every 5 minutes for up to 3 doses.    Aspirin 81 Mg Tbec (Aspirin) .Marland Kitchen... Take one tablet by mouth daily    Plavix 75 Mg Tabs (Clopidogrel bisulfate) .Marland Kitchen... Take 1 tablet by mouth once a day    Amlodipine Besylate 5 Mg Tabs (Amlodipine besylate) .Marland Kitchen... Take 1 1/2 tablet daily    Lisinopril 20 Mg Tabs (Lisinopril) .Marland Kitchen... Take one tablet by mouth daily  Problem # 3:  HYPERCHOLESTEROLEMIA  IIA (ICD-272.0) Discussed co Q 10 for tired legs.   Her updated medication list for this problem includes:    Crestor 10 Mg Tabs (Rosuvastatin calcium) .Marland Kitchen... Take one tablet by mouth daily.  Other Orders: EKG w/ Interpretation (93000)  Patient Instructions: 1)  Your physician recommends that you schedule a follow-up appointment in: 3 MONTHS 2)  Your physician recommends that you continue on your current medications as directed. Please refer to the Current Medication list given to you today.

## 2010-12-02 NOTE — Assessment & Plan Note (Signed)
Summary: 2-4 WK ROV PER DR Jamy Cleckler/NWS   Vital Signs:  Patient profile:   70 year old female Height:      63.5 inches (161.29 cm) Weight:      210.4 pounds (95.64 kg) O2 Sat:      98 % on Room air Temp:     98.3 degrees F (36.83 degrees C) oral Pulse rate:   57 / minute BP sitting:   132 / 72  (left arm) Cuff size:   regular  Vitals Entered By: Orlan Leavens (Mar 11, 2010 8:29 AM)  O2 Flow:  Room air CC: follow-up visit Is Patient Diabetic? No Pain Assessment Patient in pain? no        Primary Care Provider:  Newt Lukes MD  CC:  follow-up visit.  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 - sched for [place] bx of nodules -   Clinical Review Panels:  Lipid Management   Cholesterol:  140 (02/03/2010)   LDL (bad choesterol):  70 (02/03/2010)   HDL (good cholesterol):  53.60 (02/03/2010)  CBC   WBC:  11.3 (02/20/2010)   RBC:  4.72 (02/20/2010)   Hgb:  13.4 (02/20/2010)   Hct:  39.9 (02/20/2010)   Platelets:  323.0 (02/20/2010)   MCV  84.5 (02/20/2010)   MCHC  33.6 (02/20/2010)   RDW  15.8 (02/20/2010)   PMN:  78.7 (02/20/2010)   Lymphs:  13.0 (02/20/2010)   Monos:  8.1 (02/20/2010)   Eosinophils:  0.0 (02/20/2010)   Basophil:  0.2 (02/20/2010)  Complete Metabolic Panel   Glucose:  93 (12/11/2009)   Sodium:  138 (12/11/2009)   Potassium:  4.1 (12/11/2009)   Chloride:  102 (12/11/2009)   CO2:  30 (12/11/2009)   BUN:   19 (12/11/2009)   Creatinine:  0.8 (12/11/2009)   Albumin:  3.9 (02/03/2010)   Total Protein:  7.0 (02/03/2010)   Calcium:  9.4 (12/11/2009)   Total Bili:  0.4 (02/03/2010)   Alk Phos:  86 (02/03/2010)   SGPT (ALT):  16 (02/03/2010)   SGOT (AST):  15 (02/03/2010)   Current Medications (verified): 1)  Metoprolol Succinate 50 Mg Xr24h-Tab (Metoprolol Succinate) .... Take 1 1/2 Tablet Daily 2)  Nitrostat 0.4 Mg Subl (Nitroglycerin) .Marland Kitchen.. 1 Tablet Under Tongue At Onset of Chest Pain; You May Repeat Every 5 Minutes For Up To 3 Doses. 3)  Acetaminophen 500 Mg Tabs (Acetaminophen) .... 2 Tab By Mouth Every 6 Hours As Needed, Max Dose 4000mg /24h 4)  Aspirin 81 Mg Tbec (Aspirin) .... Take One Tablet By Mouth Daily 5)  Plavix 75 Mg Tabs (Clopidogrel Bisulfate) .... Take 1 Tablet By Mouth Once A Day 6)  Amlodipine Besylate 5 Mg Tabs (Amlodipine Besylate) .... Take 1 1/2 Tablet Daily 7)  Crestor 10 Mg Tabs (Rosuvastatin Calcium) .... Take One Tablet By Mouth Daily. 8)  Lisinopril 20  Mg Tabs (Lisinopril) .... Take One Tablet By Mouth Daily 9)  Protonix 40 Mg Tbec (Pantoprazole Sodium) .... Take 1 Tablet By Mouth Once A Day  Allergies (verified): No Known Drug Allergies  Past History:  Past Medical History: CAD s/p 2v CABG 11/2009 s/p AMI ant wall PAF - no anticoag hypertension obesity Depression thyroid nodule 12/2009 CT chest - abn [place] 01/2010   MD rooster; cards - [facility] - [facility]  Review of Systems  The patient denies anorexia, fever, chest pain, syncope, and peripheral edema.    Physical Exam  General:  overweight-appearing.  alert, well-developed, well-nourished, and cooperative to examination.   nontoxic Lungs:  normal respiratory effort, no intercostal retractions or use of accessory muscles; normal breath sounds bilaterally - no crackles and no wheezes.   Heart:  normal rate, regular rhythm, no murmur, and no rub. BLE without edema. normal DP pulses and normal cap refill in  all 4 extremities    Skin:  numerous skin tags and moles anlong neck and anterior chest Psych:  Oriented X3, memory intact for recent and remote, normally interactive, good eye contact, not anxious appearing, not depressed appearing, and not agitated.      Impression & Recommendations:  Problem # 1:  GOITER, MULTINODULAR (ICD-241.1) per endo - planning US guided bx  Problem # 2:  UTI (ICD-599.0) s/p cipro course - improved - keep close surviellence for nonsp symptoms that may indicate underlying prob in future  Problem # 3:  DYSPNEA (ICD-786.05) CXR 03/05/10 improved - no effusion or ASD symptoms likely multifact and currently improved since resolution of infx (UTI) reassurance provided - cont cardiac rehab as ongoing -  Problem # 4:  LUNG NODULE (ICD-518.89)  R mid lung nonsp area 12/2009 CT - report reviewed with pt -  recheck 3-6 mos as high risk (recent tobacco cessation s/p MI/CABG 11/2009) - pt aware and i will schedule at that time  Problem # 5:  SKIN TAG (ICD-701.9) cosemetic - refer to derm given the number of them - Orders: Dermatology Referral (Derma)  Time spent with patient 25 minutes, more than 50% of this time was spent counseling patient on dyspnea causes, plans for followup testing (thyroid and lung scans) and eventual colo - ?this fall/winter as pt plans to be off plavix (per her report of cards plans)  Complete Medication List: 1)  Metoprolol Succinate 50 Mg Xr24h-tab (Metoprolol succinate) .... Take 1 1/2 tablet daily 2)  Nitrostat 0.4 Mg Subl (Nitroglycerin) .Marland Kitchen.. 1 tablet under tongue at onset of chest pain; you may repeat every 5 minutes for up to 3 doses. 3)  Acetaminophen 500 Mg Tabs (Acetaminophen) .... 2 tab by mouth every 6 hours as needed, max dose 4000mg /24h 4)  Aspirin 81 Mg Tbec (Aspirin) .... Take one tablet by mouth daily 5)  Plavix 75 Mg Tabs (Clopidogrel bisulfate) .... Take 1 tablet by mouth once a day 6)  Amlodipine Besylate 5 Mg Tabs (Amlodipine  besylate) .... Take 1 1/2 tablet daily 7)  Crestor 10 Mg Tabs (Rosuvastatin calcium) .... Take one tablet by mouth daily. 8)  Lisinopril 20 Mg Tabs (Lisinopril) .... Take one tablet by mouth daily 9)  Protonix 40 Mg Tbec (Pantoprazole sodium) .... Take 1 tablet by mouth once a day  Patient Instructions: 1)  it was good to see you today.  2)  recent labs reviewed -let us know if any symptoms change that warrant further evaluation 3)  Please schedule a follow-up appointment in 3 months,  sooner if problems.  4)  will schedule for CT recheck of right lung in june. Our office will contact you regarding this appointment once made.  5)  will plan to refer for colonoscopy later this year. 6)  we'll make referral to dermatology for skin tag treatment. Our office will contact you regarding this appointment once made.  7)  thryoid ultrasound as per dr. Everardo All

## 2010-12-02 NOTE — Assessment & Plan Note (Signed)
Summary: 6 week f/u    Visit Type:  6 weeks follow up Primary Provider:  No PCP at times  CC:  Chest discomfort.  History of Present Illness: Does not feel quite as well.  When she is exercising at [facility] it is somewhat lower.  Before exercise, BP is ok.  Resting BP at rehab was 114.  Slowly doing ok.  She was in the hospital for fluid a month ago.  Current Medications (verified): 1)  Metoprolol Tartrate 25 Mg Tabs (Metoprolol Tartrate) .... Take 1 1/2 Tablet By Mouth Twice A Day 2)  Nitrostat 0.4 Mg Subl (Nitroglycerin) .Marland Kitchen.. 1 Tablet Under Tongue At Onset of Chest Pain; You May Repeat Every 5 Minutes For Up To 3 Doses. 3)  Acetaminophen 500 Mg Tabs (Acetaminophen) .... Take Two Times A Day As Needed 4)  Aspirin Ec 325 Mg Tbec (Aspirin) .... Take One Tablet By Mouth Daily 5)  Plavix 75 Mg Tabs (Clopidogrel Bisulfate) .... Take 1 Tablet By Mouth Once A Day 6)  Amlodipine Besylate 5 Mg Tabs (Amlodipine Besylate) .... Take One Tablet By Mouth Daily 7)  Crestor 10 Mg Tabs (Rosuvastatin Calcium) .... Take One Tablet By Mouth Daily. 8)  Lisinopril 20 Mg Tabs (Lisinopril) .... Take One Tablet By Mouth Daily 9)  Protonix 40 Mg Tbec (Pantoprazole Sodium) .... Take 1 Tablet By Mouth Once A Day  Allergies (verified): No Known Drug Allergies  Vital Signs:  Patient profile:   70 year old female Height:      63.5 inches Weight:      210 pounds BMI:     36.75 Pulse rate:   55 / minute Pulse rhythm:   regular Resp:     18 per minute BP sitting:   158 / 68  (left arm) Cuff size:   large  Vitals Entered By: Vikki Ports (February 05, 2010 11:43 AM)  Physical Exam  General:  Well developed, well nourished, in no acute distress. Head:  normocephalic and atraumatic Eyes:  PERRLA/EOM intact; conjunctiva and lids normal. Neck:  Question of thyroid nodole left lower lobe of thyroid. Lungs:  Clear bilaterally to auscultation and percussion. Heart:  PMI non displaced.  Normal S1 and S2.  No  murmur. Abdomen:  Bowel sounds positive; abdomen soft and non-tender without masses, organomegaly, or hernias noted. No hepatosplenomegaly. Extremities:  No clubbing or cyanosis. Neurologic:  Alert and oriented x 3.   CT Scan  Procedure date:  01/05/2010  Findings:       IMPRESSION:   No evidence pulmonary embolism.   Question asymmetric left thyroid lobe versus mass/nodule inferior   pole, recommend correlation with physical exam and potentially   thyroid ultrasound.   7.5 mm diameter questionable nodular focus versus less likely   infiltrate in right middle lobe, recommendations below.   If the patient is at high risk for bronchogenic carcinoma, follow-   up chest CT at 3-6 months is recommended.  If the patient is at low   risk for bronchogenic carcinoma, follow-up chest CT at 6-12 months   is recommended.  This recommendation follows the consensus   statement: Guidelines for Management of Small Pulmonary Nodules   Detected on CT Scans: A Statement from the Fleischner Society as   published in Radiology 2005; 237:395-400. Online at:   [email or web address].cz.  EKG  Procedure date:  02/05/2010  Findings:      NSR.  Lateral T wave inversion.   Impression & Recommendations:  Problem # 1:  CAD, ARTERY BYPASS GRAFT (ICD-414.04)  Doing well.   Still in cardiac rehab.  Admitted with chest pain.  See CT scan results. Her updated medication list for this problem includes:    Metoprolol Succinate 50 Mg Xr24h-tab (Metoprolol succinate) .Marland Kitchen... Take one and one-half  tablet by mouth daily    Nitrostat 0.4 Mg Subl (Nitroglycerin) .Marland Kitchen... 1 tablet under tongue at onset of chest pain; you may repeat every 5 minutes for up to 3 doses.    Aspirin Ec 325 Mg Tbec (Aspirin) .Marland Kitchen... Take one tablet by mouth daily    Plavix 75 Mg Tabs (Clopidogrel bisulfate) .Marland Kitchen... Take 1 tablet by mouth once a day    Amlodipine Besylate 5 Mg Tabs (Amlodipine besylate) .Marland Kitchen... Take one  and one-half tablet by mouth daily    Lisinopril 20 Mg Tabs (Lisinopril) .Marland Kitchen... Take one tablet by mouth daily  Orders: EKG w/ Interpretation (93000)  Problem # 2:  HYPERCHOLESTEROLEMIA  IIA (ICD-272.0)  Her updated medication list for this problem includes:    Crestor 10 Mg Tabs (Rosuvastatin calcium) .Marland Kitchen... Take one tablet by mouth daily.  Her updated medication list for this problem includes:    Crestor 10 Mg Tabs (Rosuvastatin calcium) .Marland Kitchen... Take one tablet by mouth daily.  Problem # 3:  HYPERTENSION, UNSPECIFIED (ICD-401.9) Will make adjustments in medication.  Will give long acting, and increased amlodipine. Her updated medication list for this problem includes:    Metoprolol Succinate 50 Mg Xr24h-tab (Metoprolol succinate) .Marland Kitchen... Take one and one-half  tablet by mouth daily    Aspirin Ec 325 Mg Tbec (Aspirin) .Marland Kitchen... Take one tablet by mouth daily    Amlodipine Besylate 5 Mg Tabs (Amlodipine besylate) .Marland Kitchen... Take one and one-half tablet by mouth daily    Lisinopril 20 Mg Tabs (Lisinopril) .Marland Kitchen... Take one tablet by mouth daily  Problem # 4:  THYROID NODULE (ICD-241.0)  Seen on CT.  Will get thyroid ultrasound.  Pt scheduled to see Rene Paci, so will forward.  Orders: Misc. Referral (Misc. Ref)  Problem # 5:  LUNG NODULE (ICD-518.89) Abnormal CT scan.  Needs follow up at 3-6 months.  Patient Instructions: 1)  Your physician recommends that you schedule a follow-up appointment in: 1 MONTH 2)  Your physician has recommended you make the following change in your medication: INCREASE Amlodipine to 5mg  one and one-half tablet daily, STOP Metoprolol Tartrate, START Metoprolol XL  50mg  one and one-half tablet daily 3)  You have been referred for a thyroid ultrasound 4)  You are due for a repeat CT scan of your Chest in 3 MONTHS  5)  Please schedule follow-up with Dr Rene Paci Prescriptions: AMLODIPINE BESYLATE 5 MG TABS (AMLODIPINE BESYLATE) Take one and one-half tablet by  mouth daily  #135 x 3   Entered by:   Julieta Gutting, RN, BSN   Authorized by:   Ronaldo Miyamoto, MD, [facility]   Signed by:   Julieta Gutting, RN, BSN on 02/05/2010   Method used:   Electronically to        [facility]* (mail-order)             ,          Ph: [phone]       Fax: [phone]   RxID:   [id number] CRESTOR 10 MG TABS (ROSUVASTATIN CALCIUM) Take one tablet by mouth daily.  #90 x 3   Entered by:   Julieta Gutting, RN, BSN   Authorized by:   Ronaldo Miyamoto,  MD, [facility]   Signed by:   Julieta Gutting, RN, BSN on 02/05/2010   Method used:   Electronically to        [facility] [employer]* (mail-order)             ,          Ph: [phone]       Fax: [phone]   RxID:   [id number] METOPROLOL SUCCINATE 50 MG XR24H-TAB (METOPROLOL SUCCINATE) Take one and one-half  tablet by mouth daily  #45 x 2   Entered by:   Julieta Gutting, RN, BSN   Authorized by:   Ronaldo Miyamoto, MD, [facility]   Signed by:   Julieta Gutting, RN, BSN on 02/05/2010   Method used:   Electronically to        [facility] [phone]* (retail)       [address]       [place], [place]  [address]       Ph: [phone] or [phone]       Fax: [phone]   RxID:   [id number]

## 2010-12-02 NOTE — Cardiovascular Report (Signed)
Summary: Office Visit   Office Visit   Imported By: Roderic Ovens 07/15/2010 13:07:39  _____________________________________________________________________  External Attachment:    Type:   Image     Comment:   External Document

## 2010-12-02 NOTE — Miscellaneous (Signed)
Summary: Device preload  Clinical Lists Changes  Observations: Added new observation of PPM INDICATN: Brady with pauses (04/07/2010 9:12) Added new observation of MAGNET RTE: BOL 100 ERI  85 (04/07/2010 9:12) Added new observation of PPMLEADSTAT2: active (04/07/2010 9:12) Added new observation of PPMLEADSER2: [id number] (04/07/2010 9:12) Added new observation of PPMLEADMOD2: 2088TC (04/07/2010 9:12) Added new observation of PPMLEADLOC2: RV (04/07/2010 9:12) Added new observation of PPMLEADSTAT1: active (04/07/2010 9:12) Added new observation of PPMLEADSER1: [id number] (04/07/2010 9:12) Added new observation of PPMLEADMOD1: 2088TC (04/07/2010 9:12) Added new observation of PPMLEADLOC1: RA (04/07/2010 9:12) Added new observation of PPM IMP MD: Lewayne Bunting, MD (04/07/2010 9:12) Added new observation of PPMLEADDOI2: 04/03/2010 (04/07/2010 9:12) Added new observation of PPMLEADDOI1: 04/03/2010 (04/07/2010 9:12) Added new observation of PPM DOI: 04/03/2010 (04/07/2010 9:12) Added new observation of PPM SERL#: [id number]  (04/07/2010 9:12) Added new observation of PPM MODL#: [id number]  (04/07/2010 7:84) Added new observation of PACEMAKERMFG: [employer] Jude  (04/07/2010 9:12) Added new observation of PACEMAKER MD: Lewayne Bunting, MD  (04/07/2010 9:12)      PPM Specifications Following MD:  Lewayne Bunting, MD     PPM Vendor:  [employer]     PPM Model Number:  [id number]     PPM Serial Number:  [id number] PPM DOI:  04/03/2010     PPM Implanting MD:  Lewayne Bunting, MD  Lead 1    Location: RA     DOI: 04/03/2010     Model #: [id number]     Serial #: [id number]     Status: active Lead 2    Location: RV     DOI: 04/03/2010     Model #: [id number]     Serial #: [id number]     Status: active  Magnet Response Rate:  BOL 100 ERI  85  Indications:  [name] with pauses

## 2010-12-02 NOTE — Assessment & Plan Note (Signed)
Summary: 1 MONTH   Visit Type:  Follow-up Referring Provider:  Raenette Rover. Lescher MD Primary Provider:  Newt Lukes MD  CC:  Pt. feels better now. Her lungs are clear. Pt. denies any more Sob..  History of Present Illness: Patient feels quite a bit better.  Needs biopsy.  Shortness of breath resolved.    Current Medications (verified): 1)  Metoprolol Succinate 50 Mg Xr24h-Tab (Metoprolol Succinate) .... Take 1 1/2 Tablet Daily 2)  Nitrostat 0.4 Mg Subl (Nitroglycerin) .Marland Kitchen.. 1 Tablet Under Tongue At Onset of Chest Pain; You May Repeat Every 5 Minutes For Up To 3 Doses. 3)  Acetaminophen 500 Mg Tabs (Acetaminophen) .... 2 Tab By Mouth Every 6 Hours As Needed, Max Dose 4000mg /24h 4)  Aspirin 81 Mg Tbec (Aspirin) .... Take One Tablet By Mouth Daily 5)  Plavix 75 Mg Tabs (Clopidogrel Bisulfate) .... Take 1 Tablet By Mouth Once A Day 6)  Amlodipine Besylate 5 Mg Tabs (Amlodipine Besylate) .... Take 1 1/2 Tablet Daily 7)  Crestor 10 Mg Tabs (Rosuvastatin Calcium) .... Take One Tablet By Mouth Daily. 8)  Lisinopril 20 Mg Tabs (Lisinopril) .... Take One Tablet By Mouth Daily 9)  Protonix 40 Mg Tbec (Pantoprazole Sodium) .... Take 1 Tablet By Mouth Once A Day  Allergies (verified): No Known Drug Allergies  Vital Signs:  Patient profile:   70 year old female Height:      63.5 inches Weight:      210 pounds BMI:     36.75 Pulse rate:   63 / minute Pulse rhythm:   regular Resp:     18 per minute BP sitting:   141 / 74  (left arm) Cuff size:   large  Vitals Entered By: Vikki Ports (Mar 13, 2010 1:46 PM)  Physical Exam  General:  Well developed, well nourished, in no acute distress. Head:  normocephalic and atraumatic Eyes:  PERRLA/EOM intact; conjunctiva and lids normal. Lungs:  Clear bilaterally to auscultation and percussion. Heart:  Non-displaced PMI, chest non-tender; regular rate and rhythm, S1, S2 without murmurs, rubs or gallops. Carotid upstroke normal, no bruit.  Normal abdominal aortic size, no bruits. Extremities:  No clubbing or cyanosis.  Trace edema. Neurologic:  Alert and oriented x 3.   Impression & Recommendations:  Problem # 1:  CAD, ARTERY BYPASS GRAFT (ICD-414.04) stable.  Will need to discuss coming off of plavix with radiology and also with Everardo All.   Her updated medication list for this problem includes:    Metoprolol Succinate 50 Mg Xr24h-tab (Metoprolol succinate) .Marland Kitchen... Take 1 1/2 tablet daily    Nitrostat 0.4 Mg Subl (Nitroglycerin) .Marland Kitchen... 1 tablet under tongue at onset of chest pain; you may repeat every 5 minutes for up to 3 doses.    Aspirin 81 Mg Tbec (Aspirin) .Marland Kitchen... Take one tablet by mouth daily    Plavix 75 Mg Tabs (Clopidogrel bisulfate) .Marland Kitchen... Take 1 tablet by mouth once a day    Amlodipine Besylate 5 Mg Tabs (Amlodipine besylate) .Marland Kitchen... Take 1 1/2 tablet daily    Lisinopril 20 Mg Tabs (Lisinopril) .Marland Kitchen... Take one tablet by mouth daily  Problem # 2:  GOITER, MULTINODULAR (ICD-241.1) needs biopsy.  Patient Instructions: 1)  Dr Riley Kill will contact Dr Everardo All to discuss Plavix and the need for  fine needle aspiration of thyroid nodules.

## 2010-12-02 NOTE — Medication Information (Signed)
Summary: Erica Hood  Anticoagulant Therapy  Managed by: Cloyde Reams, RN Referring MD: Dr Shawnie Pons Supervising MD: Tenny Craw MD, Gunnar Fusi Indication 1: Atrial Fibrillation Lab Used: [facility] Site: [address] INR POC 2.7 INR RANGE 2.0-3.0  Dietary changes: no    Health status changes: no    Bleeding/hemorrhagic complications: no    Recent/future hospitalizations: no    Any changes in medication regimen? no    Recent/future dental: no  Any missed doses?: no       Is patient compliant with meds? yes       Current Medications (verified): 1)  Metoprolol Tartrate 25 Mg Tabs (Metoprolol Tartrate) .... Take 1 1/2 Tablet By Mouth Twice A Day 2)  Nitrostat 0.4 Mg Subl (Nitroglycerin) .Marland Kitchen.. 1 Tablet Under Tongue At Onset of Chest Pain; You May Repeat Every 5 Minutes For Up To 3 Doses. 3)  Warfarin Sodium 5 Mg Tabs (Warfarin Sodium) .... Take As Directed By Coumadin Clinic. 4)  Acetaminophen 500 Mg Tabs (Acetaminophen) .... Take Two Times A Day As Needed 5)  Aspirin Ec 325 Mg Tbec (Aspirin) .... Take One Tablet By Mouth Daily 6)  Plavix 75 Mg Tabs (Clopidogrel Bisulfate) .... Take 1 Tablet By Mouth Once A Day 7)  Furosemide 40 Mg Tabs (Furosemide) .... Take One Tablet By Mouth Daily. 8)  Lisinopril 10 Mg Tabs (Lisinopril) .... Take One Tablet By Mouth Daily 9)  Crestor 20 Mg Tabs (Rosuvastatin Calcium) .... Take 1 Tab By Mouth At Bedtime  Allergies (verified): No Known Drug Allergies  Anticoagulation Management History:      The patient is taking warfarin and comes in today for a routine follow up visit.  Positive risk factors for bleeding include an age of 27 years or older.  The bleeding index is 'intermediate risk'.  Negative CHADS2 values include Age > 70 years old.  Anticoagulation responsible provider: Tenny Craw MD, Gunnar Fusi.  INR POC: 2.7.  Cuvette Lot#: [id number].  Exp: 02/2011.    Anticoagulation Management Assessment/Plan:      The patient's current  anticoagulation dose is Warfarin sodium 5 mg tabs: Take as directed by coumadin clinic..  The target INR is 2.0-3.0.  The next INR is due 12/02/2009.  Results were reviewed/authorized by Cloyde Reams, RN.  She was notified by Lew Dawes, PharmD Candidate.         Prior Anticoagulation Instructions: INR 2.2  Start taking 1/2 tablet daily except 1 tablet on Tuesdays and Saturdays.  Recheck on Monday.  Current Anticoagulation Instructions: INR 2.7  Continue same dose of 0.5 tablet daily except 1 tablet Tuesdays and Saturdays. Recheck in 1 week.

## 2010-12-02 NOTE — Miscellaneous (Signed)
Summary: [facility] Cardiac Progress Note   [facility] Cardiac Progress Note   Imported By: Roderic Ovens 05/21/2010 10:34:56  _____________________________________________________________________  External Attachment:    Type:   Image     Comment:   External Document

## 2010-12-02 NOTE — Progress Notes (Signed)
Summary: Clarify Medication  Phone Note Outgoing Call   Call placed by: Julieta Gutting, RN, BSN,  May 26, 2010 11:01 AM Call placed to: Patient Summary of Call:  I received a letter from [facility] stating that the pt could be taking both Metoprolol Tartrate and Metoprolol Succinate.  I left a message for the pt to call back and clarify her medications.  Julieta Gutting, RN, BSN  May 26, 2010 11:03 AM  I spoke with the pt and she is taking Metoprolol Succinate as prescribed.  She said that she gets this medication through [facility].  [facility].  The pt said [facility] has told [facility] to stop [facility] medication because she is no longer taking this medication.   The pt states she is going to stop using [facility] because [facility] are not listening to her.   Initial call taken by: Julieta Gutting, RN, BSN,  May 26, 2010 1:30 PM

## 2010-12-02 NOTE — Assessment & Plan Note (Signed)
Summary: sob   Visit Type:  Follow-up Primary Provider:  Newt Lukes MD  CC:  Sob.  History of Present Illness: When patient goes to rehab she exercises with treadmill, bike, and new stepper.  She gets no discomfort, but she does get winded fairly easily.  Since the bladder infection was treated, she can go on the treadmill much more easily.  She still gets a little winded at this.    Current Medications (verified): 1)  Metoprolol Succinate 50 Mg Xr24h-Tab (Metoprolol Succinate) .... Take 1 1/2 Tablet Daily 2)  Nitrostat 0.4 Mg Subl (Nitroglycerin) .Marland Kitchen.. 1 Tablet Under Tongue At Onset of Chest Pain; You May Repeat Every 5 Minutes For Up To 3 Doses. 3)  Acetaminophen 500 Mg Tabs (Acetaminophen) .... 2 Tab By Mouth Every 6 Hours As Needed, Max Dose 4000mg /24h 4)  Aspirin 81 Mg Tbec (Aspirin) .... Take One Tablet By Mouth Daily 5)  Plavix 75 Mg Tabs (Clopidogrel Bisulfate) .... Take 1 Tablet By Mouth Once A Day 6)  Amlodipine Besylate 5 Mg Tabs (Amlodipine Besylate) .... Take 1 1/2 Tablet Daily 7)  Crestor 10 Mg Tabs (Rosuvastatin Calcium) .... Take One Tablet By Mouth Daily. 8)  Lisinopril 20 Mg Tabs (Lisinopril) .... Take One Tablet By Mouth Daily 9)  Protonix 40 Mg Tbec (Pantoprazole Sodium) .... Take 1 Tablet By Mouth Once A Day  Allergies (verified): No Known Drug Allergies  Vital Signs:  Patient profile:   70 year old female Height:      63.5 inches Weight:      208.50 pounds BMI:     36.49 O2 Sat:      98 % on Room air Pulse rate:   60 / minute Pulse rhythm:   regular BP sitting:   138 / 72  (left arm) Cuff size:   large  Vitals Entered By: Vikki Ports (Mar 05, 2010 1:47 PM)  O2 Flow:  Room air  Physical Exam  General:  Well developed, well nourished, in no acute distress. Head:  normocephalic and atraumatic Eyes:  PERRLA/EOM intact; conjunctiva and lids normal. Lungs:  No definite rales or wheezes.   Heart:  Non-displaced PMI, chest non-tender; regular rate  and rhythm, S1, S2 without murmurs, rubs or gallops. Carotid upstroke normal, no bruit. Abdomen:  Bowel sounds positive; abdomen soft and non-tender without masses, organomegaly, or hernias noted. No hepatosplenomegaly. Extremities:  No clubbing or cyanosis. Neurologic:  Alert and oriented x 3.   EKG  Procedure date:  03/05/2010  Findings:      NSR. nonspecific T wave abnormality.   CXR  Procedure date:  03/05/2010  Findings:      CHEST - 2 VIEW   Comparison: Chest radiograph 02/20/2010   Findings: Sternotomy wires overlie stable enlarged cardiac silhouette.  Costophrenic angles are clear.  No evidence effusion, infiltrate, or pneumothorax.  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   IMPRESSION:   1.  Stable cardiomegaly. 2.  No acute cardiopulmonary process.    Impression & Recommendations:  Problem # 1:  DYSPNEA (ICD-786.05) Not quite sure mechanism.  Will get CXR today and reassess.  See results.. Her updated medication list for this problem includes:    Metoprolol Succinate 50 Mg Xr24h-tab (Metoprolol succinate) .Marland Kitchen... Take 1 1/2 tablet daily    Aspirin 81 Mg Tbec (Aspirin) .Marland Kitchen... Take one tablet by mouth daily    Amlodipine Besylate 5 Mg Tabs (Amlodipine besylate) .Marland Kitchen... Take 1 1/2  tablet daily    Lisinopril 20 Mg Tabs (Lisinopril) .Marland Kitchen... Take one tablet by mouth daily  Orders: EKG w/ Interpretation (93000) T-2 View CXR (71020TC)  Problem # 2:  GOITER, MULTINODULAR (ICD-241.1) Needs probable biopsy to aseess.  Will wait for final determination.   Problem # 3:  CAD, ARTERY BYPASS GRAFT (ICD-414.04) Had LIMA to LAD, and SVG to RCA.  Currently stable. Her updated medication list for this problem includes:    Metoprolol Succinate 50 Mg Xr24h-tab (Metoprolol succinate) .Marland Kitchen... Take 1 1/2 tablet daily    Nitrostat 0.4 Mg Subl (Nitroglycerin) .Marland Kitchen... 1 tablet under tongue at onset of chest pain; you may repeat every 5  minutes for up to 3 doses.    Aspirin 81 Mg Tbec (Aspirin) .Marland Kitchen... Take one tablet by mouth daily    Plavix 75 Mg Tabs (Clopidogrel bisulfate) .Marland Kitchen... Take 1 tablet by mouth once a day    Amlodipine Besylate 5 Mg Tabs (Amlodipine besylate) .Marland Kitchen... Take 1 1/2 tablet daily    Lisinopril 20 Mg Tabs (Lisinopril) .Marland Kitchen... Take one tablet by mouth daily  Problem # 4:  HYPERCHOLESTEROLEMIA  IIA (ICD-272.0) currently on medication Her updated medication list for this problem includes:    Crestor 10 Mg Tabs (Rosuvastatin calcium) .Marland Kitchen... Take one tablet by mouth daily.  Patient Instructions: 1)  Chest x-ray obtained in the office today.  Prescriptions: PROTONIX 40 MG TBEC (PANTOPRAZOLE SODIUM) Take 1 tablet by mouth once a day  #90 x 3   Entered by:   Julieta Gutting, RN, BSN   Authorized by:   Ronaldo Miyamoto, MD, [facility]   Signed by:   Julieta Gutting, RN, BSN on 03/05/2010   Method used:   Electronically to        [facility]* (mail-order)             ,          Ph: [phone]       Fax: [phone]   RxID:   [id number] METOPROLOL SUCCINATE 50 MG XR24H-TAB (METOPROLOL SUCCINATE) take 1 1/2 tablet daily  #135 x 3   Entered by:   Julieta Gutting, RN, BSN   Authorized by:   Ronaldo Miyamoto, MD, [facility]   Signed by:   Julieta Gutting, RN, BSN on 03/05/2010   Method used:   Electronically to        [facility]* (mail-order)             ,          Ph: [phone]       Fax: [phone]   RxID:   [id number]

## 2010-12-02 NOTE — Assessment & Plan Note (Signed)
Summary: FU  STC   Vital Signs:  Patient profile:   70 year old female Height:      63.5 inches (161.29 cm) Weight:      227.50 pounds (103.41 kg) BMI:     39.81 O2 Sat:      96 % on Room air Temp:     98.5 degrees F (36.94 degrees C) oral Pulse rate:   60 / minute Pulse rhythm:   regular BP sitting:   112 / 70  (left arm) Cuff size:   large  Vitals Entered By: Brenton Grills CMA Duncan Dull) (September 30, 2010 8:54 AM)  O2 Flow:  Room air CC: Follow-up visit/aj Is Patient Diabetic? No   Referring Provider:  Raenette Rover. Lescher MD Primary Provider:  Newt Lukes MD  CC:  Follow-up visit/aj.  History of Present Illness: pt had low-risk bx result early in 2011, for multinodular goiter.  she reports fatigue, emotional lability, and arthralgias.    Current Medications (verified): 1)  Metoprolol Succinate 50 Mg Xr24h-Tab (Metoprolol Succinate) .... Take 1 1/2 Tablet Daily 2)  Nitrostat 0.4 Mg Subl (Nitroglycerin) .Marland Kitchen.. 1 Tablet Under Tongue At Onset of Chest Pain; You May Repeat Every 5 Minutes For Up To 3 Doses. 3)  Acetaminophen 500 Mg Tabs (Acetaminophen) .... 2 Tab By Mouth Every 6 Hours As Needed, Max Dose 4000mg /24h 4)  Aspirin 81 Mg Tbec (Aspirin) .... Take One Tablet By Mouth Daily 5)  Plavix 75 Mg Tabs (Clopidogrel Bisulfate) .... Take 1 Tablet By Mouth Once A Day 6)  Amlodipine Besylate 5 Mg Tabs (Amlodipine Besylate) .... Take 1 1/2 Tablet Daily 7)  Crestor 10 Mg Tabs (Rosuvastatin Calcium) .... Take One Tablet By Mouth Daily. 8)  Lisinopril 20 Mg Tabs (Lisinopril) .... Take One Tablet By Mouth Daily 9)  Protonix 40 Mg Tbec (Pantoprazole Sodium) .... Take 1 Tablet By Mouth Once A Day 10)  Coq10 100 Mg Caps (Coenzyme Q10) .... Take 1 By Mouth Once Daily 11)  Methocarbamol 500 Mg Tabs (Methocarbamol) .Marland Kitchen.. 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    Physical Exam  General:  obese.  no distress  Neck:  i think i can feel the top of a goiter, but i can't tell details Additional Exam:  FastTSH       1.92 uIU/mL       Impression & Recommendations:  Problem # 1:  GOITER, MULTINODULAR (ICD-241.1) Assessment Unchanged  Other Orders: Radiology Referral (Radiology) TLB-TSH (Thyroid Stimulating Hormone) (84443-TSH) Est. Patient Level III (57846)  Patient Instructions: 1)  most of the time, a "lumpy thyroid" will eventually become overactive.  this is usually a slow process, happening over the span of many years. 2)  recheck thyroid ultrasound.  you will be called with a day and time for an appointment. 3)  blood tests are being ordered for you today.  please call [phone] to hear your test results. 4)  Please schedule  a follow-up appointment in 1 year. 5)  (update: i left message on phone-tree:  rx as we discussed).   Orders Added: 1)  Radiology Referral [Radiology] 2)  TLB-TSH (Thyroid Stimulating Hormone) [84443-TSH] 3)  Est. Patient Level III [16109]

## 2010-12-02 NOTE — Assessment & Plan Note (Signed)
Summary: PER LUCY-FU-STC   Vital Signs:  Patient profile:   70 year old female Height:      63.5 inches (161.29 cm) Weight:      209.4 pounds (95.18 kg) O2 Sat:      97 % on Room air Temp:     97.9 degrees F (36.61 degrees C) oral Pulse rate:   62 / minute BP sitting:   130 / 62  (left arm) Cuff size:   large  Vitals Entered By: Orlan Leavens (April 16, 2010 2:51 PM)  O2 Flow:  Room air CC: follow-up visit Is Patient Diabetic? No Pain Assessment Patient in pain? no        Primary Care Provider:  Newt Lukes MD  CC:  follow-up visit.  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 - not intrested in spine eval, PT, surg, shots or meds - +mild-mod pain in neck and shoulders - wax/wane intensity - not requiring pain meds - im;roved since 6/1 no weakness in hands or arms but occ numbness L>R  also elevated inflammation - esr, crp - at 04/02/10 labs - no fever - no dysuria or cough or other illness symptoms - feels well today  A fib - 6/1 after leaving here OV, felt dizzy and weak - to ER - having tach-brady -  underwent PPM placement for same - now feels better - no dizzy, sob, cp or palp - site of pm nontender -no drainage  lung nodule - ct done - results reviewed - no hemopytsis or cough or sputum  Current Medications (verified): 1)  Metoprolol Succinate 50 Mg Xr24h-Tab (Metoprolol Succinate) .... Take 1 1/2 Tablet Daily 2)  Nitrostat 0.4 Mg Subl (Nitroglycerin) .Marland Kitchen.. 1 Tablet Under Tongue At Onset of Chest Pain; You May Repeat Every 5 Minutes For Up To 3 Doses. 3)  Acetaminophen 500 Mg Tabs (Acetaminophen) .... 2 Tab By Mouth Every 6 Hours As Needed, Max Dose 4000mg /24h 4)  Aspirin 81 Mg Tbec (Aspirin) .... Take One Tablet By Mouth Daily 5)  Plavix 75 Mg Tabs (Clopidogrel Bisulfate) .... Take 1 Tablet By Mouth Once A Day 6)  Amlodipine  Besylate 5 Mg Tabs (Amlodipine Besylate) .... Take 1 1/2 Tablet Daily 7)  Crestor 10 Mg Tabs (Rosuvastatin Calcium) .... Take One Tablet By Mouth Daily. 8)  Lisinopril 20 Mg Tabs (Lisinopril) .... Take One Tablet By Mouth Daily 9)  Protonix 40 Mg Tbec (Pantoprazole Sodium) .... Take 1 Tablet By Mouth Once A Day  Allergies (verified): No Known Drug Allergies  Past History:  Past Medical History: CAD s/p 2v CABG 11/2009 s/p AMI ant wall PAF - no anticoag s/p PPM 04/02/10 - SSS hypertension obesity Depression thyroid nodule 12/2009 CT chest - abn [place] 01/2010 - bx done - neg RUL lung nodule - 04/2010 ct f/u - stable - f/u 10/2010   MD roster; cards - stuckey endo - ellison  Review of Systems  The patient denies fever, weight loss, hoarseness, chest pain, syncope, dyspnea on exertion, peripheral edema, headaches, abdominal pain, difficulty walking, and depression.    Physical Exam  General:  overweight-appearing.  nontoxic - alert, well-developed, well-nourished, and cooperative to examination.    Lungs:  normal respiratory effort, no intercostal retractions or use of accessory muscles; normal breath sounds bilaterally - no crackles and no wheezes.    Heart:  normal rate, regular rhythm, no murmur, and no rub. BLE without edema.    Msk:  normal ROM, no joint tenderness, no joint swelling, no joint warmth, no redness over joints, no joint deformities, no joint instability, no crepitation, and no muscle atrophy.   Neurologic:  alert & oriented X3 and cranial nerves II-XII symetrically intact.  strength normal in all extremities, sensation intact to light touch, and gait normal. speech fluent without dysarthria or aphasia; follows commands with good comprehension.    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 -  neck pain, shoulder pain and left hand numbness all intermittent symptoms may be  cooming from neck - unable to obtain MRI due to recent PPM but could obtain ct to eval - pt declines at this time - "not that bad and i need a break from doctors - no surgery, no medication, nothing" - also declines PT referral - "i haven't even finished cardiac rehab" - advised stretch exercises, tylenol and to call if symptoms worsen or change Time spent with patient 25 minutes, more than 50% of this time was spent counseling patient on xray, arthritis mgmt, recent hosp and PPM and CT lung results  Problem # 2:  ESR, ELEVATED (ICD-790.1) ?etiology - no obvious infx or active problems - recheck now -  Orders: TLB-CRP-High Sensitivity (C-Reactive Protein) (86140-FCRP) TLB-Sedimentation Rate (ESR) (85652-ESR)  Problem # 3:  LUNG NODULE (ICD-518.89)  R mid lung nonsp area 12/2009 CT - reck 04/2010 stable -report reviewed with pt -  recheck 6 mos as high risk (recent tobacco cessation s/p MI/CABG 11/2009) - pt aware and i will schedule at that time  Problem # 4:  PAROXYSMAL ATRIAL FIBRILLATION (ICD-427.31)  s/p PPM for SSS 04/02/10 - per cards Her updated medication list for this problem includes:    Metoprolol Succinate 50 Mg Xr24h-tab (Metoprolol succinate) .Marland Kitchen... Take 1 1/2 tablet daily    Aspirin 81 Mg Tbec (Aspirin) .Marland Kitchen... Take one tablet by mouth daily    Plavix 75 Mg Tabs (Clopidogrel bisulfate) .Marland Kitchen... Take 1 tablet by mouth once a day    Amlodipine Besylate 5 Mg Tabs (Amlodipine besylate) .Marland Kitchen... Take 1 1/2 tablet daily  Reviewed the following: Coumadin Dose (weekly): 20 mg (12/11/2009) Prior Coumadin Dose (weekly): 20 mg (12/11/2009) Next Protime: 12/16/2009 (dated on 12/02/2009)  per cards note 04/07/10: Now on DAPT.  Has dual chamber pacer in now.  Followup soon with Dr. Ladona Ridgel   Complete Medication List: 1)  Metoprolol Succinate 50 Mg Xr24h-tab (Metoprolol succinate) .... Take 1 1/2 tablet daily 2)  Nitrostat 0.4 Mg Subl (Nitroglycerin) .Marland Kitchen.. 1 tablet under tongue at onset of chest  pain; you may repeat every 5 minutes for up to 3 doses. 3)  Acetaminophen 500 Mg Tabs (Acetaminophen) .... 2 tab by mouth every 6 hours as needed, max dose 4000mg /24h 4)  Aspirin 81 Mg Tbec (Aspirin) .... Take one tablet by mouth daily 5)  Plavix 75 Mg Tabs (Clopidogrel bisulfate) .... Take 1 tablet by mouth once a day 6)  Amlodipine Besylate 5 Mg Tabs (Amlodipine besylate) .... Take 1 1/2 tablet daily 7)  Crestor 10 Mg Tabs (Rosuvastatin calcium) .... Take one tablet by mouth daily. 8)  Lisinopril 20 Mg Tabs (Lisinopril) .... Take one tablet by mouth daily 9)  Protonix 40 Mg Tbec (Pantoprazole sodium) .... Take 1 tablet by mouth once a day  Patient Instructions: 1)  it was good  to see you today.  2)  discussion of your neck arthritis symptoms and conservative managment - if you desire evaluation by spine specialist or physical therapy for this, let me know - 3)  test(s) ordered today - your results will be posted on the phone tree for review in 48-72 hours from the time of test completion; call [phone] and enter your 9 digit MRN (listed above on this page, just below your name); if any changes need to be made or there are abnormal results, you will be contacted directly.  4)  keep follow-up appointment as previously scheduled, call sooner if problems.

## 2010-12-02 NOTE — Progress Notes (Signed)
Summary: Dizzy?  Phone Note Call from Patient Call back at [facility] Phone [phone]   Summary of Call: Pt called and c/o dizzyness since leaving the office today. left mess to call office back. Initial call taken by: Lamar Sprinkles, CMA,  April 02, 2010 1:32 PM  Follow-up for Phone Call        Left another vm for pt to call office back w/any further problems.  Follow-up by: Lamar Sprinkles, CMA,  April 03, 2010 2:27 PM

## 2010-12-02 NOTE — Cardiovascular Report (Signed)
Summary: Office Visit   Office Visit   Imported By: Roderic Ovens 07/29/2010 15:22:26  _____________________________________________________________________  External Attachment:    Type:   Image     Comment:   External Document

## 2010-12-02 NOTE — Medication Information (Signed)
Summary: Coumadin Clinic  Anticoagulant Therapy  Managed by: Inactive Referring MD: Dr Shawnie Pons Supervising MD: Shirlee Latch MD, Freida Busman Indication 1: Atrial Fibrillation Lab Used: [facility] [employer] Site: [address] INR RANGE 2.0-3.0          Comments: Inactive by med d/c at MD visit 12/11/2009.Marland Kitchenmp  Allergies: No Known Drug Allergies  Anticoagulation Management History:      Positive risk factors for bleeding include an age of 32 years or older.  The bleeding index is 'intermediate risk'.  Positive CHADS2 values include History of HTN.  Negative CHADS2 values include Age > 39 years old.  Anticoagulation responsible provider: Shirlee Latch MD, [place].  Exp: 02/2011.    Anticoagulation Management Assessment/Plan:      The patient's current anticoagulation dose is Warfarin sodium 5 mg tabs: Take as directed by coumadin clinic..  The target INR is 2.0-3.0.  The next INR is due 12/16/2009.  Anticoagulation instructions were given to patient.  Results were reviewed/authorized by Inactive.         Prior Anticoagulation Instructions: Will decrease dose to:  1/2 tab everyday except take 1 whole tab on Saturday

## 2010-12-02 NOTE — Letter (Signed)
Summary: [facility] Vitals Record  [facility] Vitals Record   Imported By: Roderic Ovens 04/01/2010 10:25:12  _____________________________________________________________________  External Attachment:    Type:   Image     Comment:   External Document

## 2010-12-04 NOTE — Progress Notes (Signed)
Summary: labs for CT  Phone Note From Other Clinic   Caller: [facility] Summary of Call: Pt needs to have BUN and Creatinine prior to CT scheduled for Monday 12/19 @ 11am. I will order labs and contact pt, what Dx code shopuld I use? Initial call taken by: Margaret Pyle, CMA,  October 17, 2010 1:52 PM  Follow-up for Phone Call        Alma Friendly, PT INFORMED OF LABS Follow-up by: Margaret Pyle, CMA,  October 17, 2010 4:10 PM

## 2010-12-04 NOTE — Procedures (Signed)
Summary: Summary Report  Summary Report   Imported By: Erle Crocker 11/19/2010 16:22:12  _____________________________________________________________________  External Attachment:    Type:   Image     Comment:   External Document

## 2010-12-04 NOTE — Assessment & Plan Note (Signed)
Summary: Erica Hood   Visit Type:  Follow-up Referring Provider:  Raenette Rover. Lescher MD Primary Provider:  Newt Lukes MD  CC:  Pt c/o PAC's.  History of Present Illness: Having alot of palpitations.  Has put on some weight.  Arms ache some.  Legs bother her and convinced she was better off Crestor.  Having lots of palpitations.    Current Medications (verified): 1)  Metoprolol Succinate 50 Mg Xr24h-Tab (Metoprolol Succinate) .... Take 1 1/2 Tablet Daily 2)  Nitrostat 0.4 Mg Subl (Nitroglycerin) .Marland Kitchen.. 1 Tablet Under Tongue At Onset of Chest Pain; You May Repeat Every 5 Minutes For Up To 3 Doses. 3)  Acetaminophen 500 Mg Tabs (Acetaminophen) .... 2 Tab By Mouth Every 6 Hours As Needed, Max Dose 4000mg /24h 4)  Aspirin 81 Mg Tbec (Aspirin) .... Take One Tablet By Mouth Daily 5)  Plavix 75 Mg Tabs (Clopidogrel Bisulfate) .... Take 1 Tablet By Mouth Once A Day 6)  Amlodipine Besylate 5 Mg Tabs (Amlodipine Besylate) .... Take 1 1/2 Tablet Daily 7)  Crestor 10 Mg Tabs (Rosuvastatin Calcium) .... Take One Tablet By Mouth Daily. 8)  Lisinopril 20 Mg Tabs (Lisinopril) .... Take One Tablet By Mouth Daily 9)  Protonix 40 Mg Tbec (Pantoprazole Sodium) .... Take 1 Tablet By Mouth Once A Day 10)  Coq10 100 Mg Caps (Coenzyme Q10) .... Take 1 By Mouth Once Daily 11)  Methocarbamol 500 Mg Tabs (Methocarbamol) .Marland Kitchen.. 1 By Mouth Every 8 Hour As Needed For Mescle Spasm Pain 12)  Vitamin B-6 100 Mg Tabs (Pyridoxine Hcl) .Marland Kitchen.. 1 By Mouth Daily  Allergies (verified): No Known Drug Allergies  Past History:  Past Medical History: Last updated: 09/19/2010 CAD s/p 2v CABG 11/2009 s/p AMI ant wall PAF - no anticoag s/p PPM 04/02/10 - SSS hypertension obesity Depression thyroid nodule 12/2009 CT chest - abn [place] 01/2010 - bx done - neg RUL lung nodule - 04/2010 ct f/u - stable - f/u 10/2010   MD roster: cards - stuckey endo - ellison  Vital Signs:  Patient profile:   70 year old female Height:      63.5  inches Weight:      226 pounds BMI:     39.55 Pulse rate:   73 / minute Resp:     16 per minute BP sitting:   182 / 102  (left arm)  Vitals Entered By: Marrion Coy, CNA (October 14, 2010 9:07 AM)  Physical Exam  General:  Well developed, well nourished, in no acute distress. Head:  normocephalic and atraumatic Eyes:  PERRLA/EOM intact; conjunctiva and lids normal. Chest Wall:  sternum looks stable. Lungs:  Clear bilaterally to auscultation and percussion. Heart:  PMI non displaced.  Normal S1 and S2.   Abdomen:  Bowel sounds positive; abdomen soft and non-tender without masses, organomegaly, or hernias noted. No hepatosplenomegaly. Extremities:  No clubbing or cyanosis. Neurologic:  Alert and oriented x 3.   EKG  Procedure date:  10/14/2010  Findings:      NSR.  Nonspecific T abnormality.  Cannot exclude ASMI, old.  PPM Specifications Following MD:  Lewayne Bunting, MD     PPM Vendor:  [employer]     PPM Model Number:  [id number]     PPM Serial Number:  [id number] PPM DOI:  04/03/2010     PPM Implanting MD:  Lewayne Bunting, MD  Lead 1    Location: [facility]     DOI: 04/03/2010     Model #: [id number]  Serial #: [id number]     Status: active Lead 2    Location: [facility]     DOI: 04/03/2010     Model #: [id number]     Serial #: [id number]     Status: active  Magnet Response Rate:  BOL 100 ERI  85  Indications:  [name] with pauses   PPM Follow Up Pacer Dependent:  No      Episodes Coumadin:  No  Parameters Mode:  DDD     Lower Rate Limit:  60     Upper Rate Limit:  100 Paced AV Delay:  300     Sensed AV Delay:  300  Impression & Recommendations:  Problem # 1:  CAD, ARTERY BYPASS GRAFT (ICD-414.04) Most likely stable.  No typical symptoms.  Continue current medical therapy Her updated medication list for this problem includes:    Metoprolol Succinate 100 Mg Xr24h-tab (Metoprolol succinate) .Marland Kitchen... Take one tablet by mouth daily    Nitrostat 0.4 Mg Subl (Nitroglycerin) .Marland Kitchen... 1 tablet under tongue at  onset of chest pain; you may repeat every 5 minutes for up to 3 doses.    Aspirin 81 Mg Tbec (Aspirin) .Marland Kitchen... Take one tablet by mouth daily    Plavix 75 Mg Tabs (Clopidogrel bisulfate) .Marland Kitchen... Take 1 tablet by mouth once a day    Amlodipine Besylate 10 Mg Tabs (Amlodipine besylate) .Marland Kitchen... Take one tablet by mouth daily    Lisinopril 20 Mg Tabs (Lisinopril) .Marland Kitchen... Take one tablet by mouth daily  Problem # 2:  HYPERTENSION, UNSPECIFIED (ICD-401.9) High today.  Will increse amlodipine and metoprolol.  She does not check at home.  Will encourage her to do so.   Her updated medication list for this problem includes:    Metoprolol Succinate 100 Mg Xr24h-tab (Metoprolol succinate) .Marland Kitchen... Take one tablet by mouth daily    Aspirin 81 Mg Tbec (Aspirin) .Marland Kitchen... Take one tablet by mouth daily    Amlodipine Besylate 10 Mg Tabs (Amlodipine besylate) .Marland Kitchen... Take one tablet by mouth daily    Lisinopril 20 Mg Tabs (Lisinopril) .Marland Kitchen... Take one tablet by mouth daily  Problem # 3:  PALPITATIONS (ICD-785.1)  Could be having PAF.  Interrogate pacer. Her updated medication list for this problem includes:    Metoprolol Succinate 100 Mg Xr24h-tab (Metoprolol succinate) .Marland Kitchen... Take one tablet by mouth daily    Nitrostat 0.4 Mg Subl (Nitroglycerin) .Marland Kitchen... 1 tablet under tongue at onset of chest pain; you may repeat every 5 minutes for up to 3 doses.    Aspirin 81 Mg Tbec (Aspirin) .Marland Kitchen... Take one tablet by mouth daily    Plavix 75 Mg Tabs (Clopidogrel bisulfate) .Marland Kitchen... Take 1 tablet by mouth once a day    Amlodipine Besylate 10 Mg Tabs (Amlodipine besylate) .Marland Kitchen... Take one tablet by mouth daily    Lisinopril 20 Mg Tabs (Lisinopril) .Marland Kitchen... Take one tablet by mouth daily  Orders: TLB-CK Total Only(Creatine Kinase/CPK) (82550-CK) EKG w/ Interpretation (93000) Event (Event)  Problem # 4:  LUNG NODULE (ICD-518.89) for followup CT.  Problem # 5:  HYPERCHOLESTEROLEMIA  IIA (ICD-272.0) Hard to tell if symptoms are related to drug.   bothers mostly the legs.  Will place on drug holiday for a short bit and reassess status. The following medications were removed from the medication list:    Crestor 10 Mg Tabs (Rosuvastatin calcium) .Marland Kitchen... Take one tablet by mouth daily.  Patient Instructions: 1)  Your physician recommends that you schedule a follow-up appointment in: 1 MONTH  2)  Your physician recommends that you have lab work today: CK Total 3)  Your physician has recommended you make the following change in your medication: INCREASE Amlodipine to 10mg  once a day, INCREASE Metoprolol Succinate to 100mg  once a day, STOP Crestor 4)  Your physician has recommended that you wear an event monitor.  Event monitors are medical devices that record the heart's electrical activity. Doctors most often use these monitors to diagnose arrhythmias. Arrhythmias are problems with the speed or rhythm of the heartbeat. The monitor is a small, portable device. You can wear one while you do your normal daily activities. This is usually used to diagnose what is causing palpitations/syncope (passing out). Prescriptions: AMLODIPINE BESYLATE 10 MG TABS (AMLODIPINE BESYLATE) Take one tablet by mouth daily  #90 x 3   Entered by:   Julieta Gutting, RN, BSN   Authorized by:   Ronaldo Miyamoto, MD, [facility]   Signed by:   Julieta Gutting, RN, BSN on 10/14/2010   Method used:   Faxed to ...       [facility] (mail-order)             , [place]         Ph: [phone]       Fax: [phone]   RxID:   [id number] METOPROLOL SUCCINATE 100 MG XR24H-TAB (METOPROLOL SUCCINATE) Take one tablet by mouth daily  #90 x 3   Entered by:   Julieta Gutting, RN, BSN   Authorized by:   Ronaldo Miyamoto, MD, [facility]   Signed by:   Julieta Gutting, RN, BSN on 10/14/2010   Method used:   Faxed to ...       [facility] (mail-order)             , [place]         Ph: [phone]       Fax: [phone]   RxID:   [id number]

## 2010-12-04 NOTE — Assessment & Plan Note (Signed)
Summary: 1 month rov    Visit Type:  Follow-up Referring Provider:  Raenette Rover. Lescher MD Primary Provider:  Newt Lukes MD  CC:  sob during activity .  History of Present Illness: Overall stable, but some more short of breath.  Has gained over twenty pounds.  Monitor showed atrial fibrillation.  Chads score is 1 but CHads vasc is 4 with vasc, and female gender adding to the risk.  We discussed options today.   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 Take One Tablet By Mouth Daily 2)  Nitrostat 0.4 Mg Subl (Nitroglycerin) .Marland Kitchen.. 1 Tablet Under Tongue At Onset of Chest Pain; You May Repeat Every 5 Minutes For Up To 3 Doses. 3)  Acetaminophen 500 Mg Tabs (Acetaminophen) .... 2 Tab By Mouth Every 6 Hours As Needed, Max Dose 4000mg /24h 4)  Aspirin 81 Mg Tbec (Aspirin) .... Take One Tablet By Mouth Daily 5)  Plavix 75 Mg Tabs (Clopidogrel Bisulfate) .... Take 1 Tablet By Mouth Once A Day 6)  Amlodipine Besylate 10 Mg Tabs (Amlodipine Besylate) .... Take One Tablet By Mouth Daily 7)  Lisinopril 20 Mg Tabs (Lisinopril) .... Take One Tablet By Mouth Daily 8)  Protonix 40  Mg Tbec (Pantoprazole Sodium) .... Take 1 Tablet By Mouth Once A Day 9)  Coq10 100 Mg Caps (Coenzyme Q10) .... Take 1 By Mouth Once Daily 10)  Methocarbamol 500 Mg Tabs (Methocarbamol) .Marland Kitchen.. 1 By Mouth Every 8 Hour As Needed For Mescle Spasm Pain 11)  Vitamin B-6 100 Mg Tabs (Pyridoxine Hcl) .Marland Kitchen.. 1 By Mouth Daily  Allergies (verified): No Known Drug Allergies  Vital Signs:  Patient profile:   69 year old female Height:      63.5 inches Weight:      230 pounds BMI:     40.25 Pulse rate:   60 / minute Resp:     18 per minute BP sitting:   146 / 76  (left arm) Cuff size:   large  Vitals Entered By: Vikki Ports (November 18, 2010 2:15 PM)  Physical Exam  General:  Well developed, well nourished, in no acute distress. Head:  normocephalic and atraumatic Eyes:  PERRLA/EOM intact; conjunctiva and lids normal. Lungs:  Clear bilaterally to auscultation and percussion. Heart:  PMI non  displaced. Normal S1 and S2.  Pulses:  pulses normal in all 4 extremities Extremities:  No clubbing or cyanosis. Neurologic:  Alert and oriented x 3.   PPM Specifications Following MD:  Lewayne Bunting, MD     PPM Vendor:  [employer]     PPM Model Number:  [id number]     PPM Serial Number:  [id number] DOI:  04/03/2010  PPM Implanting MD:  Lewayne Bunting, MD  Lead 1    Location: [facility]     DOI: 04/03/2010     Model #: [id number]     Serial #: [id number]     Status: active Lead 2    Location: [facility]     DOI: 04/03/2010     Model #: [id number]     Serial #: [id number]     Status: active  Magnet Response Rate:  BOL 100 ERI  85  Indications:  [name] with pauses   PPM Follow Up Pacer Dependent:  No      Episodes Coumadin:  No  Parameters Mode:  DDD     Lower Rate Limit:  60     Upper Rate Limit:  100 Paced AV Delay:  300     Sensed AV Delay:  300  Impression & Recommendations:  Problem # 1:  PAROXYSMAL ATRIAL FIBRILLATION (ICD-427.31)  clearly has evidence of atrial fibrillation on monitor.  Has CHADS VASC of 4.  Favor  warfarin.  WIll stop Plavix.  Has DES in LAD, but more than a year with a bypassed vessel.  Reveiwed in detail. The following medications were removed from the medication list:    Plavix 75 Mg Tabs (Clopidogrel bisulfate) .Marland Kitchen... Take 1 tablet by mouth once a day Her updated medication list for this problem includes:    Metoprolol Succinate 100 Mg Xr24h-tab (Metoprolol succinate) .Marland Kitchen... Take one tablet by mouth daily    Aspirin 81 Mg Tbec (Aspirin) .Marland Kitchen... Take one tablet by mouth daily    Coumadin 2.5 Mg Tabs (Warfarin sodium) .Marland Kitchen... Take as directed  Orders: 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  The following medications were removed from the medication list:    Plavix 75 Mg Tabs (Clopidogrel bisulfate) .Marland Kitchen... Take 1 tablet by mouth once a day Her updated medication list for this problem includes:    Metoprolol Succinate 100 Mg Xr24h-tab (Metoprolol succinate) .Marland Kitchen... Take one tablet by mouth daily    Nitrostat 0.4 Mg Subl (Nitroglycerin) .Marland Kitchen... 1 tablet under tongue at onset of chest pain; you may repeat every 5 minutes for up to 3 doses.    Aspirin 81 Mg Tbec (Aspirin) .Marland Kitchen... Take one tablet by mouth daily    Amlodipine Besylate 10 Mg Tabs (Amlodipine besylate) .Marland Kitchen... Take one tablet by mouth daily    Lisinopril 20 Mg Tabs (Lisinopril) .Marland Kitchen... Take one tablet by mouth daily    Coumadin 2.5 Mg Tabs (Warfarin sodium) .Marland Kitchen... Take as directed  The following medications were removed from the medication list:    Plavix 75 Mg Tabs (Clopidogrel bisulfate) .Marland Kitchen... Take 1 tablet by mouth once a day Her updated medication list for this problem includes:    Metoprolol Succinate 100 Mg Xr24h-tab (Metoprolol succinate) .Marland Kitchen... Take one tablet by mouth daily    Nitrostat 0.4 Mg Subl (Nitroglycerin) .Marland Kitchen... 1 tablet  under tongue at onset of chest pain; you may repeat every 5 minutes for up to 3 doses.    Aspirin 81 Mg Tbec (Aspirin) .Marland Kitchen... Take one tablet by mouth daily    Amlodipine Besylate 10 Mg Tabs (Amlodipine besylate) .Marland Kitchen... Take one tablet by mouth daily    Lisinopril 20 Mg Tabs (Lisinopril) .Marland Kitchen... Take one tablet by mouth daily    Coumadin 2.5 Mg Tabs (Warfarin sodium) .Marland KitchenMarland KitchenMarland KitchenMarland Kitchen  Take as directed  Patient Instructions: 1)  Your physician recommends that you have lab work today: CBC 2)  Your physician has recommended you make the following change in your medication: STOP Plavix, START Coumadin 2.5mg  one by mouth once daily on Friday, RESTART Crestor 10mg  at bedtime  3)  You have been referred to Coumadin Clinic---check BP at appt (need appt 11/25/10) 4)  Your physician recommends that you schedule a follow-up appointment in: 4-6 WEEKS Prescriptions: COUMADIN 2.5 MG TABS (WARFARIN SODIUM) take as directed  #30 x 1   Entered by:   Julieta Gutting, RN, BSN   Authorized by:   Ronaldo Miyamoto, MD, [facility]   Signed by:   Julieta Gutting, RN, BSN on 11/18/2010   Method used:   Electronically to        [phone] (retail)       [address]       [place], [place]  [address]       Ph: [phone] or [phone]       Fax: [phone]   RxID:   [id number]

## 2010-12-04 NOTE — Medication Information (Signed)
Summary: returning patient per  stuckey/sl  Anticoagulant Therapy  Managed by: Weston Brass, PharmD Referring MD: Dr Shawnie Pons PCP: Newt Lukes MD Supervising MD: Graciela Husbands MD, Viviann Spare Indication 1: Atrial Fibrillation Lab Used: [facility] [place] Site: [address] INR POC 1.1 INR RANGE 2.0-3.0  Dietary changes: no    Health status changes: no    Bleeding/hemorrhagic complications: no    Recent/future hospitalizations: no    Any changes in medication regimen? no    Recent/future dental: no  Any missed doses?: no       Is patient compliant with meds? yes       Current Medications (verified): 1)  Metoprolol Succinate 100 Mg Xr24h-Tab (Metoprolol Succinate) .... Take One Tablet By Mouth Daily 2)  Nitrostat 0.4 Mg Subl (Nitroglycerin) .Marland Kitchen.. 1 Tablet Under Tongue At Onset of Chest Pain; You May Repeat Every 5 Minutes For Up To 3 Doses. 3)  Acetaminophen 500 Mg Tabs (Acetaminophen) .... 2 Tab By Mouth Every 6 Hours As Needed, Max Dose 4000mg /24h 4)  Aspirin 81 Mg Tbec (Aspirin) .... Take One Tablet By Mouth Daily 5)  Amlodipine Besylate 10 Mg Tabs (Amlodipine Besylate) .... Take One Tablet By Mouth Daily 6)  Lisinopril 20 Mg Tabs (Lisinopril) .... Take One Tablet By Mouth Daily 7)  Protonix 40 Mg Tbec (Pantoprazole Sodium) .... Take 1 Tablet By Mouth Once A Day 8)  Coq10 100 Mg Caps (Coenzyme Q10) .... Take 1 By Mouth Once Daily 9)  Methocarbamol 500 Mg Tabs (Methocarbamol) .Marland Kitchen.. 1 By Mouth Every 8 Hour As Needed For Mescle Spasm Pain 10)  Vitamin B-6 100 Mg Tabs (Pyridoxine Hcl) .Marland Kitchen.. 1 By Mouth Daily 11)  Coumadin 2.5 Mg Tabs (Warfarin Sodium) .... Take As Directed 12)  Crestor 10 Mg Tabs (Rosuvastatin Calcium) .... Take One Tablet By Mouth Daily. 13)  Fish Oil 1200 Mg Caps (Omega-3 Fatty Acids) .... Take 3-4 Tablets Everyday  Allergies: No Known Drug Allergies  Anticoagulation Management History:      Positive risk factors for bleeding include an age  of 68 years or older.  The bleeding index is 'intermediate risk'.  Positive CHADS2 values include History of HTN.  Negative CHADS2 values include Age > 68 years old.  Anticoagulation responsible provider: Graciela Husbands MD, Viviann Spare.  INR POC: 1.1.  Exp: 02/2011.    Anticoagulation Management Assessment/Plan:      The patient's current anticoagulation dose is Coumadin 2.5 mg tabs: take as directed.  The target INR is 2.0-3.0.  The next INR is due 12/02/2010.  Anticoagulation instructions were given to patient.  Results were reviewed/authorized by Weston Brass, PharmD.  She was notified by Linward Headland, PharmD candidate.         Prior Anticoagulation Instructions: Will decrease dose to:  1/2 tab everyday except take 1 whole tab on Saturday  Current Anticoagulation Instructions: INR 1.1 (INR goal: 2-3)  Take an extra tablet today (Tuesday).  Take 1 tablet everyday except 2 tablets on Wednesdays.  Recheck in 1 week.

## 2010-12-04 NOTE — Letter (Signed)
Summary: [facility]   Imported By: Sherian Rein 11/27/2010 14:23:57  _____________________________________________________________________  External Attachment:    Type:   Image     Comment:   External Document

## 2010-12-04 NOTE — Cardiovascular Report (Signed)
Summary: Office Visit   Office Visit   Imported By: Roderic Ovens 10/31/2010 16:22:27  _____________________________________________________________________  External Attachment:    Type:   Image     Comment:   External Document

## 2010-12-04 NOTE — Consult Note (Signed)
Summary: [facility]  [facility]   Imported By: Sherian Rein 10/29/2010 08:17:50  _____________________________________________________________________  External Attachment:    Type:   Image     Comment:   External Document

## 2010-12-04 NOTE — Procedures (Signed)
Summary: [facility]   Current Medications (verified): 1)  Metoprolol Succinate 50 Mg Xr24h-Tab (Metoprolol Succinate) .... Take 1 1/2 Tablet Daily 2)  Nitrostat 0.4 Mg Subl (Nitroglycerin) .Marland Kitchen.. 1 Tablet Under Tongue At Onset of Chest Pain; You May Repeat Every 5 Minutes For Up To 3 Doses. 3)  Acetaminophen 500 Mg Tabs (Acetaminophen) .... 2 Tab By Mouth Every 6 Hours As Needed, Max Dose 4000mg /24h 4)  Aspirin 81 Mg Tbec (Aspirin) .... Take One Tablet By Mouth Daily 5)  Plavix 75 Mg Tabs (Clopidogrel Bisulfate) .... Take 1 Tablet By Mouth Once A Day 6)  Amlodipine Besylate 5 Mg Tabs (Amlodipine Besylate) .... Take 1 1/2 Tablet Daily 7)  Crestor 10 Mg Tabs (Rosuvastatin Calcium) .... Take One Tablet By Mouth Daily. 8)  Lisinopril 20 Mg Tabs (Lisinopril) .... Take One Tablet By Mouth Daily 9)  Protonix 40 Mg Tbec (Pantoprazole Sodium) .... Take 1 Tablet By Mouth Once A Day 10)  Coq10 100 Mg Caps (Coenzyme Q10) .... Take 1 By Mouth Once Daily 11)  Methocarbamol 500 Mg Tabs (Methocarbamol) .Marland Kitchen.. 1 By Mouth Every 8 Hour As Needed For Mescle Spasm Pain 12)  Vitamin B-6 100 Mg Tabs (Pyridoxine Hcl) .Marland Kitchen.. 1 By Mouth Daily  Allergies (verified): No Known Drug Allergies  PPM Specifications Following MD:  Lewayne Bunting, MD     PPM Vendor:  [employer]     PPM Model Number:  [id number]     PPM Serial Number:  [id number] PPM DOI:  04/03/2010     PPM Implanting MD:  Lewayne Bunting, MD  Lead 1    Location: [facility]     DOI: 04/03/2010     Model #: [id number]     Serial #: [id number]     Status: active Lead 2    Location: [facility]     DOI: 04/03/2010     Model #: [id number]     Serial #: [id number]     Status: active  Magnet Response Rate:  BOL 100 ERI  85  Indications:  [name] with pauses   PPM Follow Up Battery Voltage:  2.98 V     Battery Est. Longevity:  10.5-12.5 yrs     Pacer Dependent:  No       PPM Device Measurements Atrium  Impedance: 450 ohms,  Right Ventricle  Impedance: 400 ohms,   Episodes MS Episodes:   0     Coumadin:  No Ventricular High Rate:  0     Atrial Pacing:  13%     Ventricular Pacing:  <1%  Parameters Mode:  DDD     Lower Rate Limit:  60     Upper Rate Limit:  100 Paced AV Delay:  300     Sensed AV Delay:  300 Next Cardiology Appt Due:  04/03/2011 Tech Comments:  INTERROGATION ONLY--PT SEEING DR Riley Kill.  NO EPISODES OR MODE SWITCHES RECORDED ON DEVICE SINCE 07-08-10. ROV IN JUNE 2012 W/GT. Vella Kohler  October 14, 2010 10:11 AM

## 2010-12-10 NOTE — Medication Information (Signed)
Summary: rov/ewj  Anticoagulant Therapy  Managed by: Weston Brass, PharmD Referring MD: Dr Shawnie Pons PCP: Newt Lukes MD Supervising MD: Myrtis Ser MD, Tinnie Gens Indication 1: Atrial Fibrillation Lab Used: [facility] [place] Site: [address] INR POC 1.6 INR RANGE 2.0-3.0  Dietary changes: no    Health status changes: no    Bleeding/hemorrhagic complications: no    Recent/future hospitalizations: no    Any changes in medication regimen? no    Recent/future dental: no  Any missed doses?: no       Is patient compliant with meds? yes      Comments: Restarted 1/19 after being on Plavix   Allergies: No Known Drug Allergies  Anticoagulation Management History:      The patient is taking warfarin and comes in today for a routine follow up visit.  Positive risk factors for bleeding include an age of 68 years or older.  The bleeding index is 'intermediate risk'.  Positive CHADS2 values include History of HTN.  Negative CHADS2 values include Age > 44 years old.  Anticoagulation responsible provider: Myrtis Ser MD, Tinnie Gens.  INR POC: 1.6.  Cuvette Lot#: [id number].  Exp: 11/2011.    Anticoagulation Management Assessment/Plan:      The patient's current anticoagulation dose is Coumadin 2.5 mg tabs: take as directed.  The target INR is 2.0-3.0.  The next INR is due 12/12/2010.  Anticoagulation instructions were given to patient.  Results were reviewed/authorized by Weston Brass, PharmD.  She was notified by Stephannie Peters, PharmD Candidate .         Prior Anticoagulation Instructions: INR 1.1 (INR goal: 2-3)  Take an extra tablet today (Tuesday).  Take 1 tablet everyday except 2 tablets on Wednesdays.  Recheck in 1 week.      Current Anticoagulation Instructions: INR 1.6   Coumadin 2.5 mg tablets  - Take an extra 2.5 mg tablet today, then take 1 tablet every day except for 2 tablets on Tuesdays and Thursdays

## 2010-12-12 ENCOUNTER — Encounter (INDEPENDENT_AMBULATORY_CARE_PROVIDER_SITE_OTHER): Payer: Medicare Other

## 2010-12-12 ENCOUNTER — Encounter: Payer: Self-pay | Admitting: Cardiology

## 2010-12-12 DIAGNOSIS — Z7901 Long term (current) use of anticoagulants: Secondary | ICD-10-CM

## 2010-12-12 DIAGNOSIS — I4891 Unspecified atrial fibrillation: Secondary | ICD-10-CM

## 2010-12-18 NOTE — Medication Information (Signed)
Summary: [facility]  Anticoagulant Therapy  Managed by: Weston Brass, PharmD Referring MD: Dr Shawnie Pons PCP: Newt Lukes MD Supervising MD: Daleen Squibb MD, Maisie Fus Indication 1: Atrial Fibrillation Lab Used: [facility] [place] Site: [address] INR POC 2.2 INR RANGE 2.0-3.0  Dietary changes: no    Health status changes: no    Bleeding/hemorrhagic complications: no    Recent/future hospitalizations: no    Any changes in medication regimen? no    Recent/future dental: no  Any missed doses?: no       Is patient compliant with meds? yes       Allergies: No Known Drug Allergies  Anticoagulation Management History:      The patient is taking warfarin and comes in today for a routine follow up visit.  Positive risk factors for bleeding include an age of 68 years or older.  The bleeding index is 'intermediate risk'.  Positive CHADS2 values include History of HTN.  Negative CHADS2 values include Age > 28 years old.  Anticoagulation responsible provider: Daleen Squibb MD, Maisie Fus.  INR POC: 2.2.  Cuvette Lot#: [id number].  Exp: 11/2011.    Anticoagulation Management Assessment/Plan:      The patient's current anticoagulation dose is Coumadin 2.5 mg tabs: take as directed.  The target INR is 2.0-3.0.  The next INR is due 01/02/2011.  Anticoagulation instructions were given to patient.  Results were reviewed/authorized by Weston Brass, PharmD.  She was notified by Margot Chimes PharmD Candidate.         Prior Anticoagulation Instructions: INR 1.6   Coumadin 2.5 mg tablets  - Take an extra 2.5 mg tablet today, then take 1 tablet every day except for 2 tablets on Tuesdays and Thursdays   Current Anticoagulation Instructions: INR 2.2   Continue your current Coumadin dose of 1 tablet everyday except 2 tablets on Tuesdays and Thursdays.  Recheck in 3 weeks.

## 2010-12-24 ENCOUNTER — Encounter: Payer: Self-pay | Admitting: Cardiology

## 2010-12-24 NOTE — Miscellaneous (Signed)
Summary: [facility]   [facility] Progress Note   Imported By: Roderic Ovens 12/17/2010 14:11:40  _____________________________________________________________________  External Attachment:    Type:   Image     Comment:   External Document

## 2010-12-30 ENCOUNTER — Encounter (HOSPITAL_COMMUNITY): Payer: Self-pay | Attending: Cardiology

## 2010-12-30 DIAGNOSIS — Z8249 Family history of ischemic heart disease and other diseases of the circulatory system: Secondary | ICD-10-CM | POA: Insufficient documentation

## 2010-12-30 DIAGNOSIS — Z5189 Encounter for other specified aftercare: Secondary | ICD-10-CM | POA: Insufficient documentation

## 2010-12-30 DIAGNOSIS — Z951 Presence of aortocoronary bypass graft: Secondary | ICD-10-CM | POA: Insufficient documentation

## 2010-12-30 DIAGNOSIS — Z9861 Coronary angioplasty status: Secondary | ICD-10-CM | POA: Insufficient documentation

## 2010-12-30 DIAGNOSIS — I509 Heart failure, unspecified: Secondary | ICD-10-CM | POA: Insufficient documentation

## 2010-12-30 DIAGNOSIS — I2589 Other forms of chronic ischemic heart disease: Secondary | ICD-10-CM | POA: Insufficient documentation

## 2010-12-30 DIAGNOSIS — K219 Gastro-esophageal reflux disease without esophagitis: Secondary | ICD-10-CM | POA: Insufficient documentation

## 2010-12-30 DIAGNOSIS — E785 Hyperlipidemia, unspecified: Secondary | ICD-10-CM | POA: Insufficient documentation

## 2010-12-30 DIAGNOSIS — I10 Essential (primary) hypertension: Secondary | ICD-10-CM | POA: Insufficient documentation

## 2010-12-30 DIAGNOSIS — F17200 Nicotine dependence, unspecified, uncomplicated: Secondary | ICD-10-CM | POA: Insufficient documentation

## 2010-12-30 DIAGNOSIS — I4891 Unspecified atrial fibrillation: Secondary | ICD-10-CM | POA: Insufficient documentation

## 2010-12-30 DIAGNOSIS — E669 Obesity, unspecified: Secondary | ICD-10-CM | POA: Insufficient documentation

## 2010-12-30 DIAGNOSIS — I4892 Unspecified atrial flutter: Secondary | ICD-10-CM | POA: Insufficient documentation

## 2010-12-30 DIAGNOSIS — Z7982 Long term (current) use of aspirin: Secondary | ICD-10-CM | POA: Insufficient documentation

## 2010-12-30 DIAGNOSIS — I651 Occlusion and stenosis of basilar artery: Secondary | ICD-10-CM | POA: Insufficient documentation

## 2010-12-30 DIAGNOSIS — I2510 Atherosclerotic heart disease of native coronary artery without angina pectoris: Secondary | ICD-10-CM | POA: Insufficient documentation

## 2010-12-30 DIAGNOSIS — I6529 Occlusion and stenosis of unspecified carotid artery: Secondary | ICD-10-CM | POA: Insufficient documentation

## 2010-12-30 DIAGNOSIS — I2582 Chronic total occlusion of coronary artery: Secondary | ICD-10-CM | POA: Insufficient documentation

## 2010-12-30 DIAGNOSIS — I252 Old myocardial infarction: Secondary | ICD-10-CM | POA: Insufficient documentation

## 2010-12-31 ENCOUNTER — Encounter (HOSPITAL_COMMUNITY): Payer: Self-pay

## 2011-01-01 ENCOUNTER — Encounter (HOSPITAL_COMMUNITY): Payer: Self-pay | Attending: Cardiology

## 2011-01-01 ENCOUNTER — Encounter: Payer: Self-pay | Admitting: Cardiology

## 2011-01-01 DIAGNOSIS — I6529 Occlusion and stenosis of unspecified carotid artery: Secondary | ICD-10-CM | POA: Insufficient documentation

## 2011-01-01 DIAGNOSIS — I651 Occlusion and stenosis of basilar artery: Secondary | ICD-10-CM | POA: Insufficient documentation

## 2011-01-01 DIAGNOSIS — Z7902 Long term (current) use of antithrombotics/antiplatelets: Secondary | ICD-10-CM | POA: Insufficient documentation

## 2011-01-01 DIAGNOSIS — E785 Hyperlipidemia, unspecified: Secondary | ICD-10-CM | POA: Insufficient documentation

## 2011-01-01 DIAGNOSIS — I4891 Unspecified atrial fibrillation: Secondary | ICD-10-CM

## 2011-01-01 DIAGNOSIS — K219 Gastro-esophageal reflux disease without esophagitis: Secondary | ICD-10-CM | POA: Insufficient documentation

## 2011-01-01 DIAGNOSIS — Z8249 Family history of ischemic heart disease and other diseases of the circulatory system: Secondary | ICD-10-CM | POA: Insufficient documentation

## 2011-01-01 DIAGNOSIS — Z951 Presence of aortocoronary bypass graft: Secondary | ICD-10-CM | POA: Insufficient documentation

## 2011-01-01 DIAGNOSIS — E7800 Pure hypercholesterolemia, unspecified: Secondary | ICD-10-CM | POA: Insufficient documentation

## 2011-01-01 DIAGNOSIS — I2582 Chronic total occlusion of coronary artery: Secondary | ICD-10-CM | POA: Insufficient documentation

## 2011-01-01 DIAGNOSIS — I4892 Unspecified atrial flutter: Secondary | ICD-10-CM | POA: Insufficient documentation

## 2011-01-01 DIAGNOSIS — Z5189 Encounter for other specified aftercare: Secondary | ICD-10-CM | POA: Insufficient documentation

## 2011-01-01 DIAGNOSIS — E669 Obesity, unspecified: Secondary | ICD-10-CM | POA: Insufficient documentation

## 2011-01-01 DIAGNOSIS — I2510 Atherosclerotic heart disease of native coronary artery without angina pectoris: Secondary | ICD-10-CM | POA: Insufficient documentation

## 2011-01-01 DIAGNOSIS — F17200 Nicotine dependence, unspecified, uncomplicated: Secondary | ICD-10-CM | POA: Insufficient documentation

## 2011-01-01 DIAGNOSIS — I509 Heart failure, unspecified: Secondary | ICD-10-CM | POA: Insufficient documentation

## 2011-01-01 DIAGNOSIS — I252 Old myocardial infarction: Secondary | ICD-10-CM | POA: Insufficient documentation

## 2011-01-01 DIAGNOSIS — I2589 Other forms of chronic ischemic heart disease: Secondary | ICD-10-CM | POA: Insufficient documentation

## 2011-01-01 DIAGNOSIS — Z7982 Long term (current) use of aspirin: Secondary | ICD-10-CM | POA: Insufficient documentation

## 2011-01-01 DIAGNOSIS — I10 Essential (primary) hypertension: Secondary | ICD-10-CM | POA: Insufficient documentation

## 2011-01-01 DIAGNOSIS — Z9861 Coronary angioplasty status: Secondary | ICD-10-CM | POA: Insufficient documentation

## 2011-01-02 ENCOUNTER — Encounter: Payer: Self-pay | Admitting: Internal Medicine

## 2011-01-02 ENCOUNTER — Encounter (INDEPENDENT_AMBULATORY_CARE_PROVIDER_SITE_OTHER): Payer: Medicare Other

## 2011-01-02 DIAGNOSIS — I4891 Unspecified atrial fibrillation: Secondary | ICD-10-CM

## 2011-01-02 DIAGNOSIS — Z7901 Long term (current) use of anticoagulants: Secondary | ICD-10-CM

## 2011-01-02 LAB — CONVERTED CEMR LAB: POC INR: 2.4

## 2011-01-06 ENCOUNTER — Encounter (HOSPITAL_COMMUNITY): Payer: Self-pay

## 2011-01-07 ENCOUNTER — Encounter (HOSPITAL_COMMUNITY): Payer: Self-pay

## 2011-01-08 ENCOUNTER — Encounter (HOSPITAL_COMMUNITY): Payer: Self-pay

## 2011-01-08 NOTE — Medication Information (Signed)
Summary: rov/cb  Anticoagulant Therapy  Managed by: Bayard Hugger, PharmD Referring MD: Dr Shawnie Pons PCP: Newt Lukes MD Supervising MD: Gala Romney MD, Reuel Boom Indication 1: Atrial Fibrillation Lab Used: [facility] [place] Site: [address] INR POC 2.4 INR RANGE 2.0-3.0  Dietary changes: yes       Details: Pt said she would like to eat more vegetables in the spring time.  Health status changes: no    Bleeding/hemorrhagic complications: no    Recent/future hospitalizations: no    Any changes in medication regimen? no    Recent/future dental: no  Any missed doses?: no       Is patient compliant with meds? yes       Allergies: No Known Drug Allergies  Anticoagulation Management History:      Positive risk factors for bleeding include an age of 5 years or older.  The bleeding index is 'intermediate risk'.  Positive CHADS2 values include History of HTN.  Negative CHADS2 values include Age > 80 years old.  Anticoagulation responsible provider: Bensimhon MD, Reuel Boom.  INR POC: 2.4.  Cuvette Lot#: [id number].  Exp: 11/2011.    Anticoagulation Management Assessment/Plan:      The patient's current anticoagulation dose is Coumadin 2.5 mg tabs: take as directed.  The target INR is 2.0-3.0.  The next INR is due 01/23/2011.  Anticoagulation instructions were given to patient.  Results were reviewed/authorized by Bayard Hugger, PharmD.         Prior Anticoagulation Instructions: INR 2.2   Continue your current Coumadin dose of 1 tablet everyday except 2 tablets on Tuesdays and Thursdays.  Recheck in 3 weeks.    Current Anticoagulation Instructions: INR 2.4 continue taking the same dose. 1 tablet (2.5mg ) every day except for Tue and Thur take 2 tablets (5mg ).  Recheck INR in 3 weeks.

## 2011-01-13 ENCOUNTER — Encounter (HOSPITAL_COMMUNITY): Payer: Self-pay

## 2011-01-14 ENCOUNTER — Encounter (HOSPITAL_COMMUNITY): Payer: Self-pay

## 2011-01-14 ENCOUNTER — Encounter: Payer: Self-pay | Admitting: Cardiology

## 2011-01-14 ENCOUNTER — Ambulatory Visit (INDEPENDENT_AMBULATORY_CARE_PROVIDER_SITE_OTHER): Payer: Medicare Other | Admitting: Cardiology

## 2011-01-14 DIAGNOSIS — I10 Essential (primary) hypertension: Secondary | ICD-10-CM

## 2011-01-14 DIAGNOSIS — I4891 Unspecified atrial fibrillation: Secondary | ICD-10-CM

## 2011-01-14 DIAGNOSIS — I2510 Atherosclerotic heart disease of native coronary artery without angina pectoris: Secondary | ICD-10-CM

## 2011-01-15 ENCOUNTER — Encounter (HOSPITAL_COMMUNITY): Payer: Self-pay

## 2011-01-18 LAB — URINALYSIS, ROUTINE W REFLEX MICROSCOPIC
Bilirubin Urine: NEGATIVE
Ketones, ur: NEGATIVE mg/dL
Nitrite: NEGATIVE
Protein, ur: NEGATIVE mg/dL
Urobilinogen, UA: 1 mg/dL (ref 0.0–1.0)

## 2011-01-18 LAB — CBC
HCT: 28.4 % — ABNORMAL LOW (ref 36.0–46.0)
HCT: 29.3 % — ABNORMAL LOW (ref 36.0–46.0)
HCT: 32.1 % — ABNORMAL LOW (ref 36.0–46.0)
HCT: 35.3 % — ABNORMAL LOW (ref 36.0–46.0)
HCT: 35.8 % — ABNORMAL LOW (ref 36.0–46.0)
HCT: 36.3 % (ref 36.0–46.0)
HCT: 36.7 % (ref 36.0–46.0)
HCT: 37 % (ref 36.0–46.0)
HCT: 43.9 % (ref 36.0–46.0)
Hemoglobin: 10.1 g/dL — ABNORMAL LOW (ref 12.0–15.0)
Hemoglobin: 10.9 g/dL — ABNORMAL LOW (ref 12.0–15.0)
Hemoglobin: 12.1 g/dL (ref 12.0–15.0)
Hemoglobin: 12.4 g/dL (ref 12.0–15.0)
Hemoglobin: 12.5 g/dL (ref 12.0–15.0)
Hemoglobin: 14.9 g/dL (ref 12.0–15.0)
Hemoglobin: 9.7 g/dL — ABNORMAL LOW (ref 12.0–15.0)
Hemoglobin: 9.9 g/dL — ABNORMAL LOW (ref 12.0–15.0)
MCHC: 33.8 g/dL (ref 30.0–36.0)
MCHC: 34 g/dL (ref 30.0–36.0)
MCHC: 34.2 g/dL (ref 30.0–36.0)
MCHC: 34.5 g/dL (ref 30.0–36.0)
MCHC: 34.5 g/dL (ref 30.0–36.0)
MCHC: 34.6 g/dL (ref 30.0–36.0)
MCV: 90.8 fL (ref 78.0–100.0)
MCV: 90.9 fL (ref 78.0–100.0)
MCV: 91.2 fL (ref 78.0–100.0)
MCV: 91.2 fL (ref 78.0–100.0)
MCV: 91.3 fL (ref 78.0–100.0)
MCV: 91.5 fL (ref 78.0–100.0)
MCV: 91.8 fL (ref 78.0–100.0)
MCV: 92.3 fL (ref 78.0–100.0)
Platelets: 210 10*3/uL (ref 150–400)
Platelets: 216 10*3/uL (ref 150–400)
Platelets: 228 10*3/uL (ref 150–400)
Platelets: 289 10*3/uL (ref 150–400)
RBC: 3.2 MIL/uL — ABNORMAL LOW (ref 3.87–5.11)
RBC: 3.21 MIL/uL — ABNORMAL LOW (ref 3.87–5.11)
RBC: 3.21 MIL/uL — ABNORMAL LOW (ref 3.87–5.11)
RBC: 3.54 MIL/uL — ABNORMAL LOW (ref 3.87–5.11)
RBC: 3.88 MIL/uL (ref 3.87–5.11)
RBC: 4 MIL/uL (ref 3.87–5.11)
RBC: 4.01 MIL/uL (ref 3.87–5.11)
RBC: 4.03 MIL/uL (ref 3.87–5.11)
RBC: 4.04 MIL/uL (ref 3.87–5.11)
RDW: 12.1 % (ref 11.5–15.5)
RDW: 12.3 % (ref 11.5–15.5)
RDW: 12.5 % (ref 11.5–15.5)
RDW: 12.6 % (ref 11.5–15.5)
RDW: 12.7 % (ref 11.5–15.5)
WBC: 11.5 10*3/uL — ABNORMAL HIGH (ref 4.0–10.5)
WBC: 11.5 10*3/uL — ABNORMAL HIGH (ref 4.0–10.5)
WBC: 6.9 10*3/uL (ref 4.0–10.5)
WBC: 7.6 10*3/uL (ref 4.0–10.5)
WBC: 7.8 10*3/uL (ref 4.0–10.5)
WBC: 8.2 10*3/uL (ref 4.0–10.5)
WBC: 9.9 10*3/uL (ref 4.0–10.5)

## 2011-01-18 LAB — POCT I-STAT, CHEM 8
Calcium, Ion: 1.11 mmol/L — ABNORMAL LOW (ref 1.12–1.32)
Calcium, Ion: 1.28 mmol/L (ref 1.12–1.32)
Chloride: 109 mEq/L (ref 96–112)
Chloride: 109 mEq/L (ref 96–112)
Creatinine, Ser: 0.6 mg/dL (ref 0.4–1.2)
Creatinine, Ser: 0.7 mg/dL (ref 0.4–1.2)
Glucose, Bld: 137 mg/dL — ABNORMAL HIGH (ref 70–99)
Glucose, Bld: 137 mg/dL — ABNORMAL HIGH (ref 70–99)
Glucose, Bld: 217 mg/dL — ABNORMAL HIGH (ref 70–99)
HCT: 31 % — ABNORMAL LOW (ref 36.0–46.0)
HCT: 46 % (ref 36.0–46.0)
Hemoglobin: 9.5 g/dL — ABNORMAL LOW (ref 12.0–15.0)
Potassium: 3.6 mEq/L (ref 3.5–5.1)
Sodium: 141 mEq/L (ref 135–145)
TCO2: 24 mmol/L (ref 0–100)

## 2011-01-18 LAB — GLUCOSE, CAPILLARY
Glucose-Capillary: 102 mg/dL — ABNORMAL HIGH (ref 70–99)
Glucose-Capillary: 102 mg/dL — ABNORMAL HIGH (ref 70–99)
Glucose-Capillary: 104 mg/dL — ABNORMAL HIGH (ref 70–99)
Glucose-Capillary: 109 mg/dL — ABNORMAL HIGH (ref 70–99)
Glucose-Capillary: 114 mg/dL — ABNORMAL HIGH (ref 70–99)
Glucose-Capillary: 117 mg/dL — ABNORMAL HIGH (ref 70–99)
Glucose-Capillary: 129 mg/dL — ABNORMAL HIGH (ref 70–99)
Glucose-Capillary: 130 mg/dL — ABNORMAL HIGH (ref 70–99)
Glucose-Capillary: 131 mg/dL — ABNORMAL HIGH (ref 70–99)
Glucose-Capillary: 74 mg/dL (ref 70–99)
Glucose-Capillary: 76 mg/dL (ref 70–99)

## 2011-01-18 LAB — BASIC METABOLIC PANEL
BUN: 13 mg/dL (ref 6–23)
BUN: 17 mg/dL (ref 6–23)
BUN: 23 mg/dL (ref 6–23)
CO2: 26 mEq/L (ref 19–32)
CO2: 26 mEq/L (ref 19–32)
CO2: 27 mEq/L (ref 19–32)
CO2: 28 mEq/L (ref 19–32)
CO2: 29 mEq/L (ref 19–32)
CO2: 30 mEq/L (ref 19–32)
Calcium: 8.7 mg/dL (ref 8.4–10.5)
Calcium: 9.1 mg/dL (ref 8.4–10.5)
Chloride: 102 mEq/L (ref 96–112)
Chloride: 103 mEq/L (ref 96–112)
Chloride: 104 mEq/L (ref 96–112)
Chloride: 106 mEq/L (ref 96–112)
Chloride: 108 mEq/L (ref 96–112)
Creatinine, Ser: 0.64 mg/dL (ref 0.4–1.2)
Creatinine, Ser: 0.77 mg/dL (ref 0.4–1.2)
Creatinine, Ser: 0.8 mg/dL (ref 0.4–1.2)
Creatinine, Ser: 0.82 mg/dL (ref 0.4–1.2)
Creatinine, Ser: 0.86 mg/dL (ref 0.4–1.2)
GFR calc Af Amer: >60 mL/min (ref >60)
GFR calc Af Amer: >60 mL/min (ref >60)
GFR calc Af Amer: >60 mL/min (ref >60)
GFR calc Af Amer: >60 mL/min (ref >60)
GFR calc Af Amer: >60 mL/min (ref >60)
Glucose, Bld: 106 mg/dL — ABNORMAL HIGH (ref 70–99)
Glucose, Bld: 108 mg/dL — ABNORMAL HIGH (ref 70–99)
Potassium: 3.4 mEq/L — ABNORMAL LOW (ref 3.5–5.1)
Potassium: 3.6 mEq/L (ref 3.5–5.1)
Potassium: 3.8 mEq/L (ref 3.5–5.1)
Potassium: 4.5 mEq/L (ref 3.5–5.1)
Sodium: 141 mEq/L (ref 135–145)

## 2011-01-18 LAB — CARDIAC PANEL(CRET KIN+CKTOT+MB+TROPI)
CK, MB: 8.2 ng/mL (ref 0.3–4.0)
CK, MB: 9.1 ng/mL — ABNORMAL HIGH (ref 0.3–4.0)
Relative Index: 10.5 — ABNORMAL HIGH (ref 0.0–2.5)
Relative Index: INVALID (ref 0.0–2.5)
Total CK: 86 U/L (ref 7–177)
Total CK: 97 U/L (ref 7–177)
Troponin I: 1.37 ng/mL (ref 0.00–0.06)
Troponin I: 1.63 ng/mL (ref 0.00–0.06)
Troponin I: 1.78 ng/mL (ref 0.00–0.06)

## 2011-01-18 LAB — POCT I-STAT 3, ART BLOOD GAS (G3+)
Acid-Base Excess: 2 mmol/L (ref 0.0–2.0)
Bicarbonate: 22.5 mEq/L (ref 20.0–24.0)
Bicarbonate: 28.9 mEq/L — ABNORMAL HIGH (ref 20.0–24.0)
O2 Saturation: 100 %
O2 Saturation: 99 %
Patient temperature: 97.4
TCO2: 23 mmol/L (ref 0–100)
TCO2: 23 mmol/L (ref 0–100)
TCO2: 30 mmol/L (ref 0–100)
pCO2 arterial: 30.6 mmHg — ABNORMAL LOW (ref 35.0–45.0)
pCO2 arterial: 42.3 mmHg (ref 35.0–45.0)
pCO2 arterial: 44.5 mmHg (ref 35.0–45.0)
pH, Arterial: 7.4 (ref 7.350–7.400)
pH, Arterial: 7.421 — ABNORMAL HIGH (ref 7.350–7.400)
pH, Arterial: 7.465 — ABNORMAL HIGH (ref 7.350–7.400)
pO2, Arterial: 338 mmHg — ABNORMAL HIGH (ref 80.0–100.0)
pO2, Arterial: 540 mmHg — ABNORMAL HIGH (ref 80.0–100.0)

## 2011-01-18 LAB — DIFFERENTIAL
Basophils Absolute: 0 10*3/uL (ref 0.0–0.1)
Basophils Relative: 0 % (ref 0–1)
Eosinophils Absolute: 0.1 10*3/uL (ref 0.0–0.7)
Eosinophils Relative: 0 % (ref 0–5)
Eosinophils Relative: 2 % (ref 0–5)
Lymphocytes Relative: 28 % (ref 12–46)
Lymphs Abs: 1.9 10*3/uL (ref 0.7–4.0)
Monocytes Absolute: 0.7 10*3/uL (ref 0.1–1.0)
Monocytes Absolute: 0.9 10*3/uL (ref 0.1–1.0)
Monocytes Relative: 12 % (ref 3–12)
Neutrophils Relative %: 63 % (ref 43–77)

## 2011-01-18 LAB — TYPE AND SCREEN: Antibody Screen: NEGATIVE

## 2011-01-18 LAB — POCT I-STAT 4, (NA,K, GLUC, HGB,HCT)
HCT: 23 % — ABNORMAL LOW (ref 36.0–46.0)
HCT: 28 % — ABNORMAL LOW (ref 36.0–46.0)
Hemoglobin: 10.9 g/dL — ABNORMAL LOW (ref 12.0–15.0)
Hemoglobin: 7.8 g/dL — ABNORMAL LOW (ref 12.0–15.0)
Potassium: 3.9 mEq/L (ref 3.5–5.1)
Potassium: 4 mEq/L (ref 3.5–5.1)
Potassium: 4.1 mEq/L (ref 3.5–5.1)
Sodium: 140 mEq/L (ref 135–145)

## 2011-01-18 LAB — COMPREHENSIVE METABOLIC PANEL
AST: 24 U/L (ref 0–37)
Albumin: 3.1 g/dL — ABNORMAL LOW (ref 3.5–5.2)
Albumin: 3.3 g/dL — ABNORMAL LOW (ref 3.5–5.2)
BUN: 16 mg/dL (ref 6–23)
BUN: 17 mg/dL (ref 6–23)
Calcium: 8.4 mg/dL (ref 8.4–10.5)
Chloride: 104 mEq/L (ref 96–112)
Creatinine, Ser: 0.75 mg/dL (ref 0.4–1.2)
Creatinine, Ser: 0.79 mg/dL (ref 0.4–1.2)
GFR calc Af Amer: >60 mL/min (ref >60)
Potassium: 3.7 mEq/L (ref 3.5–5.1)
Total Bilirubin: 0.5 mg/dL (ref 0.3–1.2)
Total Protein: 6.4 g/dL (ref 6.0–8.3)
Total Protein: 7 g/dL (ref 6.0–8.3)

## 2011-01-18 LAB — PROTIME-INR
INR: 1.03 (ref 0.00–1.49)
INR: 1.03 (ref 0.00–1.49)
INR: 1.1 (ref 0.00–1.49)
INR: 4.59 — ABNORMAL HIGH (ref 0.00–1.49)
Prothrombin Time: 43.1 seconds — ABNORMAL HIGH (ref 11.6–15.2)

## 2011-01-18 LAB — TROPONIN I
Troponin I: 0.07 ng/mL — ABNORMAL HIGH (ref 0.00–0.06)
Troponin I: 0.78 ng/mL (ref 0.00–0.06)

## 2011-01-18 LAB — BLOOD GAS, ARTERIAL
FIO2: 0.21 %
Patient temperature: 98.6
TCO2: 25.5 mmol/L (ref 0–100)
pCO2 arterial: 36.5 mmHg (ref 35.0–45.0)
pH, Arterial: 7.44 — ABNORMAL HIGH (ref 7.350–7.400)

## 2011-01-18 LAB — POCT CARDIAC MARKERS
CKMB, poc: 8.3 ng/mL (ref 1.0–8.0)
Myoglobin, poc: 94.7 ng/mL (ref 12–200)

## 2011-01-18 LAB — TSH
TSH: 2.274 u[IU]/mL (ref 0.350–4.500)
TSH: 3.579 u[IU]/mL (ref 0.350–4.500)

## 2011-01-18 LAB — HEPARIN INDUCED THROMBOCYTOPENIA PNL
Patient O.D.: 0.141
Serotonin Release: 0 % release (ref <20)

## 2011-01-18 LAB — CK TOTAL AND CKMB (NOT AT ARMC)
CK, MB: 1 ng/mL (ref 0.3–4.0)
Total CK: 30 U/L (ref 7–177)

## 2011-01-18 LAB — LIPID PANEL
Cholesterol: 186 mg/dL (ref 0–200)
HDL: 41 mg/dL (ref >39)
LDL Cholesterol: 134 mg/dL — ABNORMAL HIGH (ref 0–99)
Total CHOL/HDL Ratio: 4.5 RATIO

## 2011-01-18 LAB — ABO/RH: ABO/RH(D): O NEG

## 2011-01-18 LAB — MAGNESIUM: Magnesium: 2.3 mg/dL (ref 1.5–2.5)

## 2011-01-18 LAB — CREATININE, SERUM
Creatinine, Ser: 0.73 mg/dL (ref 0.4–1.2)
GFR calc Af Amer: >60 mL/min (ref >60)
GFR calc non Af Amer: >60 mL/min (ref >60)

## 2011-01-18 LAB — URINE MICROSCOPIC-ADD ON

## 2011-01-18 LAB — APTT
aPTT: 115 seconds — ABNORMAL HIGH (ref 24–37)
aPTT: 32 seconds (ref 24–37)

## 2011-01-18 LAB — PLATELET COUNT: Platelets: 330 10*3/uL (ref 150–400)

## 2011-01-18 LAB — HEMOGLOBIN A1C: Mean Plasma Glucose: 131 mg/dL

## 2011-01-19 LAB — CBC
HCT: 35.5 % — ABNORMAL LOW (ref 36.0–46.0)
HCT: 40.4 % (ref 36.0–46.0)
Hemoglobin: 10 g/dL — ABNORMAL LOW (ref 12.0–15.0)
MCHC: 34 g/dL (ref 30.0–36.0)
MCV: 89.6 fL (ref 78.0–100.0)
MCV: 84.8 fL (ref 78.0–100.0)
MCV: 84.9 fL (ref 78.0–100.0)
Platelets: 246 10*3/uL (ref 150–400)
RBC: 3.28 MIL/uL — ABNORMAL LOW (ref 3.87–5.11)
RBC: 4.18 MIL/uL (ref 3.87–5.11)
RDW: 13 % (ref 11.5–15.5)
RDW: 16.2 % — ABNORMAL HIGH (ref 11.5–15.5)
WBC: 6.3 10*3/uL (ref 4.0–10.5)

## 2011-01-19 LAB — BASIC METABOLIC PANEL
CO2: 27 mEq/L (ref 19–32)
CO2: 27 mEq/L (ref 19–32)
Calcium: 8.5 mg/dL (ref 8.4–10.5)
Chloride: 105 mEq/L (ref 96–112)
Creatinine, Ser: 0.64 mg/dL (ref 0.4–1.2)
GFR calc Af Amer: >60 mL/min (ref >60)
GFR calc Af Amer: >60 mL/min (ref >60)
GFR calc non Af Amer: >60 mL/min (ref >60)
Potassium: 3.5 mEq/L (ref 3.5–5.1)
Sodium: 134 mEq/L — ABNORMAL LOW (ref 135–145)
Sodium: 138 mEq/L (ref 135–145)

## 2011-01-19 LAB — GLUCOSE, CAPILLARY: Glucose-Capillary: 166 mg/dL — ABNORMAL HIGH (ref 70–99)

## 2011-01-19 LAB — URINALYSIS, ROUTINE W REFLEX MICROSCOPIC
Bilirubin Urine: NEGATIVE
Hgb urine dipstick: NEGATIVE
Protein, ur: NEGATIVE mg/dL
Urobilinogen, UA: 0.2 mg/dL (ref 0.0–1.0)

## 2011-01-19 LAB — COMPREHENSIVE METABOLIC PANEL
AST: 22 U/L (ref 0–37)
BUN: 11 mg/dL (ref 6–23)
CO2: 28 mEq/L (ref 19–32)
Chloride: 108 mEq/L (ref 96–112)
Creatinine, Ser: 0.69 mg/dL (ref 0.4–1.2)
GFR calc Af Amer: >60 mL/min (ref >60)
GFR calc non Af Amer: >60 mL/min (ref >60)
Total Bilirubin: 0.9 mg/dL (ref 0.3–1.2)

## 2011-01-19 LAB — DIFFERENTIAL
Eosinophils Absolute: 0 10*3/uL (ref 0.0–0.7)
Eosinophils Relative: 0 % (ref 0–5)
Lymphs Abs: 1.7 10*3/uL (ref 0.7–4.0)

## 2011-01-19 LAB — POCT CARDIAC MARKERS
CKMB, poc: <1 ng/mL — ABNORMAL LOW (ref 1.0–8.0)
Myoglobin, poc: 67.3 ng/mL (ref 12–200)

## 2011-01-19 LAB — CARDIAC PANEL(CRET KIN+CKTOT+MB+TROPI)
Relative Index: INVALID (ref 0.0–2.5)
Total CK: 25 U/L (ref 7–177)

## 2011-01-19 LAB — PROTIME-INR: Prothrombin Time: 13.8 seconds (ref 11.6–15.2)

## 2011-01-20 ENCOUNTER — Encounter (HOSPITAL_COMMUNITY): Payer: Self-pay

## 2011-01-20 NOTE — Consult Note (Signed)
Summary: [facility] Physician Referral Form  [facility] Rehab Physician Referral Form   Imported By: Roderic Ovens 01/12/2011 10:33:48  _____________________________________________________________________  External Attachment:    Type:   Image     Comment:   External Document

## 2011-01-21 ENCOUNTER — Encounter (HOSPITAL_COMMUNITY): Payer: Self-pay

## 2011-01-22 ENCOUNTER — Encounter (HOSPITAL_COMMUNITY): Payer: Self-pay

## 2011-01-23 ENCOUNTER — Ambulatory Visit (INDEPENDENT_AMBULATORY_CARE_PROVIDER_SITE_OTHER): Payer: Medicare Other | Admitting: *Deleted

## 2011-01-23 ENCOUNTER — Other Ambulatory Visit: Payer: Self-pay | Admitting: Pharmacist

## 2011-01-23 DIAGNOSIS — Z7901 Long term (current) use of anticoagulants: Secondary | ICD-10-CM

## 2011-01-23 DIAGNOSIS — I4891 Unspecified atrial fibrillation: Secondary | ICD-10-CM

## 2011-01-23 LAB — POCT INR: INR: 2.2

## 2011-01-23 MED ORDER — WARFARIN SODIUM 2.5 MG PO TABS: 2.5000 mg | ORAL_TABLET | ORAL | Status: DC

## 2011-01-23 NOTE — Patient Instructions (Signed)
Continue on same dosage 1 tablet daily except 2 tablets on Tuesdays and Thursdays.  Recheck in 4 weeks.

## 2011-01-26 LAB — COMPREHENSIVE METABOLIC PANEL
ALT: 31 U/L (ref 0–35)
AST: 24 U/L (ref 0–37)
Albumin: 3.4 g/dL — ABNORMAL LOW (ref 3.5–5.2)
CO2: 28 mEq/L (ref 19–32)
Chloride: 102 mEq/L (ref 96–112)
GFR calc Af Amer: >60 mL/min (ref >60)
GFR calc non Af Amer: >60 mL/min (ref >60)
Sodium: 137 mEq/L (ref 135–145)
Total Bilirubin: 0.6 mg/dL (ref 0.3–1.2)

## 2011-01-26 LAB — POCT CARDIAC MARKERS

## 2011-01-26 LAB — CARDIAC PANEL(CRET KIN+CKTOT+MB+TROPI)
CK, MB: 0.5 ng/mL (ref 0.3–4.0)
CK, MB: 0.8 ng/mL (ref 0.3–4.0)
Relative Index: INVALID (ref 0.0–2.5)
Total CK: 23 U/L (ref 7–177)
Troponin I: 0.08 ng/mL — ABNORMAL HIGH (ref 0.00–0.06)

## 2011-01-26 LAB — DIFFERENTIAL
Basophils Absolute: 0 10*3/uL (ref 0.0–0.1)
Basophils Relative: 0 % (ref 0–1)
Eosinophils Absolute: 0.1 10*3/uL (ref 0.0–0.7)
Eosinophils Relative: 1 % (ref 0–5)
Lymphocytes Relative: 15 % (ref 12–46)

## 2011-01-26 LAB — POCT I-STAT, CHEM 8
Calcium, Ion: 1.12 mmol/L (ref 1.12–1.32)
Chloride: 106 mEq/L (ref 96–112)
HCT: 41 % (ref 36.0–46.0)
Hemoglobin: 13.9 g/dL (ref 12.0–15.0)
TCO2: 27 mmol/L (ref 0–100)

## 2011-01-26 LAB — CBC
HCT: 38.5 % (ref 36.0–46.0)
MCHC: 33.4 g/dL (ref 30.0–36.0)
Platelets: 324 10*3/uL (ref 150–400)
RDW: 14.1 % (ref 11.5–15.5)

## 2011-01-26 LAB — PROTIME-INR
INR: 1.05 (ref 0.00–1.49)
Prothrombin Time: 13.6 seconds (ref 11.6–15.2)

## 2011-01-26 LAB — D-DIMER, QUANTITATIVE: D-Dimer, Quant: 1.61 ug/mL-FEU — ABNORMAL HIGH (ref 0.00–0.48)

## 2011-01-27 ENCOUNTER — Encounter (HOSPITAL_COMMUNITY): Payer: Self-pay

## 2011-01-28 ENCOUNTER — Encounter (HOSPITAL_COMMUNITY): Payer: Self-pay

## 2011-01-29 ENCOUNTER — Encounter (HOSPITAL_COMMUNITY): Payer: Self-pay

## 2011-01-29 NOTE — Assessment & Plan Note (Signed)
Summary: 4-6 wk f/u    Visit Type:  Follow-up Referring Provider:  Raenette Rover. Lescher MD Primary Provider:  Newt Lukes MD  CC:  shortness of breath, a-fib, and .  History of Present Illness: Erica Hood is in for follow up and we discussed a number of issues.  She has had some depression in the past, and it has been problematic perhaps in light of the number of medical issues she has had in the past little bit.  Overall she is stable, and she now has pacemaker and is status post CABG for MVD.  She has some shortness of breath, although it does not seem overwhelming.  Currently stable overall.  She is do CT followup with Dr. Felicity Coyer who she now sees for her general medical care.    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 Take One Tablet By Mouth Daily 2)  Nitrostat 0.4 Mg Subl (Nitroglycerin) .Marland Kitchen.. 1 Tablet Under Tongue At Onset of Chest Pain; You May Repeat Every 5 Minutes For Up To 3 Doses. 3)  Acetaminophen 500 Mg Tabs (Acetaminophen) .... 2 Tab By Mouth Every 6 Hours As Needed, Max Dose 4000mg /24h 4)  Aspirin 81 Mg Tbec (Aspirin) .... Take One Tablet By Mouth  Daily 5)  Amlodipine Besylate 10 Mg Tabs (Amlodipine Besylate) .... Take One Tablet By Mouth Daily 6)  Lisinopril 20 Mg Tabs (Lisinopril) .... Take One Tablet By Mouth Daily 7)  Protonix 40 Mg Tbec (Pantoprazole Sodium) .... Take 1 Tablet By Mouth Once A Day 8)  Coq10 100 Mg Caps (Coenzyme Q10) .... Take 1 By Mouth Once Daily 9)  Methocarbamol 500 Mg Tabs (Methocarbamol) .Marland Kitchen.. 1 By Mouth Every 8 Hour As Needed For Mescle Spasm Pain 10)  Vitamin B-6 100 Mg Tabs (Pyridoxine Hcl) .Marland Kitchen.. 1 By Mouth Daily 11)  Coumadin 2.5 Mg Tabs (Warfarin Sodium) .... Take As Directed 12)  Crestor 10 Mg Tabs (Rosuvastatin Calcium) .... Take One Tablet By Mouth Daily. 13)  Fish Oil 1200 Mg Caps (Omega-3 Fatty Acids) .... (On Hold)  Take 3-4 Tablets Everyday  Allergies (verified): No Known Drug Allergies  Past History:  Past Medical History: Last updated: 09/19/2010 CAD s/p 2v CABG 11/2009 s/p AMI ant wall PAF - no anticoag s/p PPM 04/02/10 - SSS hypertension obesity Depression thyroid nodule 12/2009 CT chest - abn [place] 01/2010 - bx done - neg RUL lung nodule - 04/2010 ct f/u - stable - f/u 10/2010   MD roster: cards - Bralee Feldt endo - ellison  Past Surgical History: Last updated: 02/20/2010  cholecystectomy (1981)  appendectomy (1960)  right breast lumpectomy (benign).  [id number]) Tonsillectomy (1956)   angioplasty  and stenting of her LAD  coronary  bypass grafting x2 on November 11, 2009 :  PROCEDURE:  Median sternotomy, off-pump coronary artery bypass grafting   x2 (saphenous vein graft in distal right coronary, left internal mammary   artery to LAD), endoscopic vein harvest right thigh.      SURGEON:  Salvatore Decent. Dorris Fetch, MD   Family History: Last updated: 03/06/2010 Significant for premature cardiovascular   disease affecting her father in his 62s.     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     Risk Factors: Alcohol Use: <1 (04/02/2010) Exercise: yes (02/20/2010)  Risk Factors: Smoking Status: quit < 6 months (04/02/2010)  Vital Signs:  Patient profile:   70 year old female Height:      63.5 inches Weight:      233.50 pounds BMI:     40.86 Pulse rate:   63 / minute BP sitting:   162 / 82  (left arm) Cuff size:   large  Vitals Entered By: Caralee Ates CMA (January 14, 2011 2:04 PM)  Physical Exam  General:  Well developed, well nourished, in no acute distress. Head:  normocephalic and atraumatic Eyes:  PERRLA/EOM intact; conjunctiva and lids normal. Lungs:  Clear bilaterally to auscultation and percussion. Heart:  PMI non displaced. Normal S1 and S2.  No definite murmur, or rub.  Gallop pos S4.   Msk:  Back normal, normal gait. Muscle strength and tone normal. Pulses:  pulses normal in all 4 extremities Extremities:  No clubbing or cyanosis. Neurologic:  Alert and oriented x 3.   EKG  Procedure date:  01/14/2011  Findings:      AV pacing, and some a pacing with v tracking.  No acute changes.    PPM Specifications Following MD:  Lewayne Bunting, MD     PPM Vendor:  [employer]     PPM Model Number:  [id number]     PPM Serial Number:  [id number] PPM DOI:  04/03/2010     PPM Implanting MD:  Lewayne Bunting, MD  Lead 1    Location: [facility]     DOI: 04/03/2010     Model #: [id number]     Serial #: [id number]     Status: active Lead 2    Location: [facility]     DOI: 04/03/2010     Model #: [id number]     Serial #: [id number]     Status: active  Magnet Response Rate:  BOL 100 ERI  85  Indications:  [name] with pauses   PPM Follow Up Pacer Dependent:  No      Episodes Coumadin:  No  Parameters Mode:  DDD     Lower Rate Limit:  60     Upper Rate Limit:  100 Paced AV Delay:   300     Sensed AV Delay:  300  Impression & Recommendations:  Problem # 1:  CORONARY ARTERY DISEASE (ICD-414.00) Cotinues to remain stable, status post CABG following AMI and stenting with overlapping multiple DES.  Other disease felt to be significant and CABG done.  No real problems since.  Her updated medication list for this problem includes:    Metoprolol Succinate 100 Mg Xr24h-tab (Metoprolol succinate) .Marland Kitchen... Take one tablet by mouth daily    Nitrostat 0.4  Mg Subl (Nitroglycerin) .Marland Kitchen... 1 tablet under tongue at onset of chest pain; you may repeat every 5 minutes for up to 3 doses.    Aspirin 81 Mg Tbec (Aspirin) .Marland Kitchen... Take one tablet by mouth daily    Amlodipine Besylate 10 Mg Tabs (Amlodipine besylate) .Marland Kitchen... Take one tablet by mouth daily    Lisinopril 20 Mg Tabs (Lisinopril) .Marland Kitchen... Take one tablet by mouth daily    Coumadin 2.5 Mg Tabs (Warfarin sodium) .Marland Kitchen... Take as directed  Orders: EKG w/ Interpretation (93000)  Problem # 2:  CARDIAC PACEMAKER IN SITU (ICD-V45.01) stable with this at the present time.    Problem # 3:  LUNG NODULE (ICD-518.89) being followed by Dr. Felicity Coyer who is ordering scans.  Problem # 4:  HYPERCHOLESTEROLEMIA  IIA (ICD-272.0) tolerating.  Will need to check status.  Her updated medication list for this problem includes:    Crestor 10 Mg Tabs (Rosuvastatin calcium) .Marland Kitchen... Take one tablet by mouth daily.  Problem # 5:  DEPRESSION (ICD-311) seems worthwhile to refer to Dr.Gutterman and colleagues.  Problem # 6:  PAROXYSMAL ATRIAL FIBRILLATION (ICD-427.31) she experiences at times, and notices a bit;  therefore, she is on chronic warfarin therapy.   Her updated medication list for this problem includes:    Metoprolol Succinate 100 Mg Xr24h-tab (Metoprolol succinate) .Marland Kitchen... Take one tablet by mouth daily    Aspirin 81 Mg Tbec (Aspirin) .Marland Kitchen... Take one tablet by mouth daily    Coumadin 2.5 Mg Tabs (Warfarin sodium) .Marland Kitchen... Take as directed  Patient  Instructions: 1)  Your physician recommends that you schedule a follow-up appointment in: 3 MONTHS 2)  Your physician recommends that you continue on your current medications as directed. Please refer to the Current Medication list given to you today. 3)  Please call [phone] for appointment with Dr Dellia Cloud [facility]). 4)  You should have follow-up CT in 1 YEAR with Dr Felicity Coyer.

## 2011-02-03 ENCOUNTER — Encounter (HOSPITAL_COMMUNITY): Payer: Self-pay | Attending: Cardiology

## 2011-02-03 DIAGNOSIS — Z951 Presence of aortocoronary bypass graft: Secondary | ICD-10-CM | POA: Insufficient documentation

## 2011-02-03 DIAGNOSIS — E7800 Pure hypercholesterolemia, unspecified: Secondary | ICD-10-CM | POA: Insufficient documentation

## 2011-02-03 DIAGNOSIS — I4892 Unspecified atrial flutter: Secondary | ICD-10-CM | POA: Insufficient documentation

## 2011-02-03 DIAGNOSIS — Z8249 Family history of ischemic heart disease and other diseases of the circulatory system: Secondary | ICD-10-CM | POA: Insufficient documentation

## 2011-02-03 DIAGNOSIS — I2510 Atherosclerotic heart disease of native coronary artery without angina pectoris: Secondary | ICD-10-CM | POA: Insufficient documentation

## 2011-02-03 DIAGNOSIS — Z9861 Coronary angioplasty status: Secondary | ICD-10-CM | POA: Insufficient documentation

## 2011-02-03 DIAGNOSIS — F17200 Nicotine dependence, unspecified, uncomplicated: Secondary | ICD-10-CM | POA: Insufficient documentation

## 2011-02-03 DIAGNOSIS — E785 Hyperlipidemia, unspecified: Secondary | ICD-10-CM | POA: Insufficient documentation

## 2011-02-03 DIAGNOSIS — I4891 Unspecified atrial fibrillation: Secondary | ICD-10-CM | POA: Insufficient documentation

## 2011-02-03 DIAGNOSIS — I10 Essential (primary) hypertension: Secondary | ICD-10-CM | POA: Insufficient documentation

## 2011-02-03 DIAGNOSIS — Z7982 Long term (current) use of aspirin: Secondary | ICD-10-CM | POA: Insufficient documentation

## 2011-02-03 DIAGNOSIS — I2589 Other forms of chronic ischemic heart disease: Secondary | ICD-10-CM | POA: Insufficient documentation

## 2011-02-03 DIAGNOSIS — I509 Heart failure, unspecified: Secondary | ICD-10-CM | POA: Insufficient documentation

## 2011-02-03 DIAGNOSIS — E669 Obesity, unspecified: Secondary | ICD-10-CM | POA: Insufficient documentation

## 2011-02-03 DIAGNOSIS — K219 Gastro-esophageal reflux disease without esophagitis: Secondary | ICD-10-CM | POA: Insufficient documentation

## 2011-02-03 DIAGNOSIS — I252 Old myocardial infarction: Secondary | ICD-10-CM | POA: Insufficient documentation

## 2011-02-03 DIAGNOSIS — I2582 Chronic total occlusion of coronary artery: Secondary | ICD-10-CM | POA: Insufficient documentation

## 2011-02-03 DIAGNOSIS — I651 Occlusion and stenosis of basilar artery: Secondary | ICD-10-CM | POA: Insufficient documentation

## 2011-02-03 DIAGNOSIS — Z7902 Long term (current) use of antithrombotics/antiplatelets: Secondary | ICD-10-CM | POA: Insufficient documentation

## 2011-02-03 DIAGNOSIS — Z5189 Encounter for other specified aftercare: Secondary | ICD-10-CM | POA: Insufficient documentation

## 2011-02-03 DIAGNOSIS — I6529 Occlusion and stenosis of unspecified carotid artery: Secondary | ICD-10-CM | POA: Insufficient documentation

## 2011-02-04 ENCOUNTER — Encounter (HOSPITAL_COMMUNITY): Payer: Self-pay

## 2011-02-05 ENCOUNTER — Encounter (HOSPITAL_COMMUNITY): Payer: Self-pay

## 2011-02-10 ENCOUNTER — Encounter (HOSPITAL_COMMUNITY): Payer: Self-pay

## 2011-02-11 ENCOUNTER — Encounter (HOSPITAL_COMMUNITY): Payer: Self-pay

## 2011-02-12 ENCOUNTER — Encounter (HOSPITAL_COMMUNITY): Payer: Self-pay

## 2011-02-16 ENCOUNTER — Other Ambulatory Visit: Payer: Self-pay | Admitting: Cardiology

## 2011-02-17 ENCOUNTER — Encounter (HOSPITAL_COMMUNITY): Payer: Self-pay

## 2011-02-18 ENCOUNTER — Encounter (HOSPITAL_COMMUNITY): Payer: Self-pay

## 2011-02-19 ENCOUNTER — Encounter (HOSPITAL_COMMUNITY): Payer: Self-pay

## 2011-02-20 ENCOUNTER — Telehealth: Payer: Self-pay

## 2011-02-20 ENCOUNTER — Ambulatory Visit (INDEPENDENT_AMBULATORY_CARE_PROVIDER_SITE_OTHER): Payer: Medicare Other | Admitting: *Deleted

## 2011-02-20 DIAGNOSIS — I4891 Unspecified atrial fibrillation: Secondary | ICD-10-CM

## 2011-02-20 DIAGNOSIS — K219 Gastro-esophageal reflux disease without esophagitis: Secondary | ICD-10-CM

## 2011-02-20 MED ORDER — PANTOPRAZOLE SODIUM 40 MG PO TBEC: 40.0000 mg | DELAYED_RELEASE_TABLET | Freq: Every day | ORAL | Status: DC

## 2011-02-20 NOTE — Telephone Encounter (Signed)
The pt was in the office today for labwork and she needs her Pantoprazole refilled.  This medication needs to be sent to [facility].  Dr Riley Kill has prescribed this medication in the past.

## 2011-02-24 ENCOUNTER — Encounter (HOSPITAL_COMMUNITY): Payer: Self-pay

## 2011-02-25 ENCOUNTER — Encounter (HOSPITAL_COMMUNITY): Payer: Self-pay

## 2011-02-26 ENCOUNTER — Encounter (HOSPITAL_COMMUNITY): Payer: Self-pay

## 2011-03-03 ENCOUNTER — Encounter (HOSPITAL_COMMUNITY): Payer: Self-pay | Attending: Cardiology

## 2011-03-03 DIAGNOSIS — K219 Gastro-esophageal reflux disease without esophagitis: Secondary | ICD-10-CM | POA: Insufficient documentation

## 2011-03-03 DIAGNOSIS — Z8249 Family history of ischemic heart disease and other diseases of the circulatory system: Secondary | ICD-10-CM | POA: Insufficient documentation

## 2011-03-03 DIAGNOSIS — Z5189 Encounter for other specified aftercare: Secondary | ICD-10-CM | POA: Insufficient documentation

## 2011-03-03 DIAGNOSIS — I2589 Other forms of chronic ischemic heart disease: Secondary | ICD-10-CM | POA: Insufficient documentation

## 2011-03-03 DIAGNOSIS — Z7902 Long term (current) use of antithrombotics/antiplatelets: Secondary | ICD-10-CM | POA: Insufficient documentation

## 2011-03-03 DIAGNOSIS — Z7982 Long term (current) use of aspirin: Secondary | ICD-10-CM | POA: Insufficient documentation

## 2011-03-03 DIAGNOSIS — Z951 Presence of aortocoronary bypass graft: Secondary | ICD-10-CM | POA: Insufficient documentation

## 2011-03-03 DIAGNOSIS — I509 Heart failure, unspecified: Secondary | ICD-10-CM | POA: Insufficient documentation

## 2011-03-03 DIAGNOSIS — E785 Hyperlipidemia, unspecified: Secondary | ICD-10-CM | POA: Insufficient documentation

## 2011-03-03 DIAGNOSIS — I651 Occlusion and stenosis of basilar artery: Secondary | ICD-10-CM | POA: Insufficient documentation

## 2011-03-03 DIAGNOSIS — I6529 Occlusion and stenosis of unspecified carotid artery: Secondary | ICD-10-CM | POA: Insufficient documentation

## 2011-03-03 DIAGNOSIS — I10 Essential (primary) hypertension: Secondary | ICD-10-CM | POA: Insufficient documentation

## 2011-03-03 DIAGNOSIS — E7800 Pure hypercholesterolemia, unspecified: Secondary | ICD-10-CM | POA: Insufficient documentation

## 2011-03-03 DIAGNOSIS — I4891 Unspecified atrial fibrillation: Secondary | ICD-10-CM | POA: Insufficient documentation

## 2011-03-03 DIAGNOSIS — I252 Old myocardial infarction: Secondary | ICD-10-CM | POA: Insufficient documentation

## 2011-03-03 DIAGNOSIS — F17200 Nicotine dependence, unspecified, uncomplicated: Secondary | ICD-10-CM | POA: Insufficient documentation

## 2011-03-03 DIAGNOSIS — E669 Obesity, unspecified: Secondary | ICD-10-CM | POA: Insufficient documentation

## 2011-03-03 DIAGNOSIS — Z9861 Coronary angioplasty status: Secondary | ICD-10-CM | POA: Insufficient documentation

## 2011-03-03 DIAGNOSIS — I2582 Chronic total occlusion of coronary artery: Secondary | ICD-10-CM | POA: Insufficient documentation

## 2011-03-03 DIAGNOSIS — I2510 Atherosclerotic heart disease of native coronary artery without angina pectoris: Secondary | ICD-10-CM | POA: Insufficient documentation

## 2011-03-03 DIAGNOSIS — I4892 Unspecified atrial flutter: Secondary | ICD-10-CM | POA: Insufficient documentation

## 2011-03-04 ENCOUNTER — Encounter (HOSPITAL_COMMUNITY): Payer: Self-pay

## 2011-03-05 ENCOUNTER — Encounter (HOSPITAL_COMMUNITY): Payer: Self-pay

## 2011-03-11 ENCOUNTER — Encounter (HOSPITAL_COMMUNITY): Payer: Self-pay

## 2011-03-12 ENCOUNTER — Encounter (HOSPITAL_COMMUNITY): Payer: Self-pay

## 2011-03-13 ENCOUNTER — Encounter (HOSPITAL_COMMUNITY): Payer: Self-pay

## 2011-03-17 ENCOUNTER — Encounter (HOSPITAL_COMMUNITY): Payer: Self-pay

## 2011-03-17 NOTE — Assessment & Plan Note (Signed)
OFFICE VISIT   MENUCHA, DICESARE  DOB:  1941/03/05                                        December 02, 2009  CHART #:  [id number]   HISTORY OF PRESENT ILLNESS:  The patient is a 70 year old woman who  presented in early January with a ST-elevation MI.  She had angioplasty  and stenting of her LAD, but had residual LAD disease which is not  amenable to percutaneous intervention.  She underwent off-pump coronary  bypass grafting x2 on November 11, 2009.  Postoperatively, she did well.  She did have some atrial fibrillation, was discharged home on  postoperative day #3, then was readmitted overnight with recurrent  atrial fibrillation and started on Coumadin.  Since that time, she has  not noted her heart racing, although she does sometimes feel that she  has got a very strong heartbeat.  Otherwise, she feels well.  She is not  taking any pain medication.  She is walking 15-20 minutes a day.  She  says she feels great and is very motivated to increase her activities.   PHYSICAL EXAMINATION:  The patient is a 70 year old woman in no acute  distress.  Her blood pressure is 170/68, pulse 55, respirations are 18,  her ox saturation is 98% on room air.  Her incisions are clean, dry,  intact, and well healed.  Sternum is stable.  Lungs are clear with  slightly diminished breath sounds at the left base.  Cardiac exam has a  regular rate and rhythm.  Normal S1 and S2.  No rubs, murmurs, or  gallops.   DIAGNOSTIC TESTS:  Chest x-ray shows a small residual left pleural  effusion.   IMPRESSION:  The patient is a 70 year old woman status post coronary  bypass grafting x2 off-pump, after initially having an angioplasty and  stenting when presenting with a ST-elevation MI.  She is doing extremely  well at this point in time.  Her exercise tolerance is good.  She is  having almost no pain.  Her only finding on exam and x-ray is a small  left pleural effusion.  I am going to  give her a prednisone taper for  that.  The only other concern is that her blood pressure is elevated, it  is 170/68.  She has not been checking at home and does have a blood  pressure cuff at home.  Her pulse is only 55.  I do not want to increase  her beta-blocker.  She is also on an ACE inhibitor, lisinopril 10 mg  daily, but I recommended that she check her blood pressure at home about  2 or 3 times a day over the next couple of days to see if it is elevated  at any specific time of day or if it is elevated all the time or this  was maybe just due to some anxiety issue, had a delay in x-ray and was  worried that she want not going to get to her appointment on time, but  she knows that her systolic number is greater than 140, we need to make  some medication adjustments.  I would be happy to see her back at  anytime.  She will continue to follow with Dr. Riley Kill and [facility].   Salvatore Decent Dorris Fetch, M.D.  Electronically Signed   SCH/MEDQ  D:  12/02/2009  T:  12/03/2009  Job:  [id number]   cc:   Arturo Morton. Riley Kill, MD, [facility].

## 2011-03-18 ENCOUNTER — Encounter (HOSPITAL_COMMUNITY): Payer: Self-pay

## 2011-03-19 ENCOUNTER — Emergency Department (HOSPITAL_COMMUNITY)
Admission: EM | Admit: 2011-03-19 | Discharge: 2011-03-19 | Disposition: A | Discharge status: Home / Self Care | Payer: Medicare Other | Attending: Emergency Medicine | Admitting: Emergency Medicine

## 2011-03-19 ENCOUNTER — Encounter: Payer: Self-pay | Admitting: Physician Assistant

## 2011-03-19 ENCOUNTER — Emergency Department (HOSPITAL_COMMUNITY): Payer: Medicare Other

## 2011-03-19 ENCOUNTER — Ambulatory Visit (INDEPENDENT_AMBULATORY_CARE_PROVIDER_SITE_OTHER): Payer: Medicare Other | Admitting: Physician Assistant

## 2011-03-19 ENCOUNTER — Ambulatory Visit (INDEPENDENT_AMBULATORY_CARE_PROVIDER_SITE_OTHER): Payer: Medicare Other | Admitting: *Deleted

## 2011-03-19 ENCOUNTER — Encounter: Payer: Medicare Other | Admitting: *Deleted

## 2011-03-19 ENCOUNTER — Encounter (HOSPITAL_COMMUNITY): Payer: Self-pay

## 2011-03-19 DIAGNOSIS — I4891 Unspecified atrial fibrillation: Secondary | ICD-10-CM

## 2011-03-19 DIAGNOSIS — M549 Dorsalgia, unspecified: Secondary | ICD-10-CM | POA: Insufficient documentation

## 2011-03-19 DIAGNOSIS — Z7901 Long term (current) use of anticoagulants: Secondary | ICD-10-CM | POA: Insufficient documentation

## 2011-03-19 DIAGNOSIS — I25810 Atherosclerosis of coronary artery bypass graft(s) without angina pectoris: Secondary | ICD-10-CM

## 2011-03-19 DIAGNOSIS — J189 Pneumonia, unspecified organism: Secondary | ICD-10-CM | POA: Insufficient documentation

## 2011-03-19 DIAGNOSIS — R059 Cough, unspecified: Secondary | ICD-10-CM | POA: Insufficient documentation

## 2011-03-19 DIAGNOSIS — I2510 Atherosclerotic heart disease of native coronary artery without angina pectoris: Secondary | ICD-10-CM

## 2011-03-19 DIAGNOSIS — Z9861 Coronary angioplasty status: Secondary | ICD-10-CM | POA: Insufficient documentation

## 2011-03-19 DIAGNOSIS — I498 Other specified cardiac arrhythmias: Secondary | ICD-10-CM

## 2011-03-19 DIAGNOSIS — R0609 Other forms of dyspnea: Secondary | ICD-10-CM | POA: Insufficient documentation

## 2011-03-19 DIAGNOSIS — R079 Chest pain, unspecified: Secondary | ICD-10-CM | POA: Insufficient documentation

## 2011-03-19 DIAGNOSIS — Z7982 Long term (current) use of aspirin: Secondary | ICD-10-CM | POA: Insufficient documentation

## 2011-03-19 DIAGNOSIS — I10 Essential (primary) hypertension: Secondary | ICD-10-CM | POA: Insufficient documentation

## 2011-03-19 DIAGNOSIS — Z951 Presence of aortocoronary bypass graft: Secondary | ICD-10-CM | POA: Insufficient documentation

## 2011-03-19 DIAGNOSIS — R0989 Other specified symptoms and signs involving the circulatory and respiratory systems: Secondary | ICD-10-CM | POA: Insufficient documentation

## 2011-03-19 DIAGNOSIS — Z950 Presence of cardiac pacemaker: Secondary | ICD-10-CM

## 2011-03-19 DIAGNOSIS — Z79899 Other long term (current) drug therapy: Secondary | ICD-10-CM | POA: Insufficient documentation

## 2011-03-19 DIAGNOSIS — R05 Cough: Secondary | ICD-10-CM | POA: Insufficient documentation

## 2011-03-19 LAB — BASIC METABOLIC PANEL
BUN: 21 mg/dL (ref 6–23)
Chloride: 101 mEq/L (ref 96–112)
Glucose, Bld: 144 mg/dL — ABNORMAL HIGH (ref 70–99)
Potassium: 4.3 mEq/L (ref 3.5–5.1)

## 2011-03-19 LAB — POCT CARDIAC MARKERS
CKMB, poc: 1.9 ng/mL (ref 1.0–8.0)
CKMB, poc: 1.5 ng/mL (ref 1.0–8.0)
Myoglobin, poc: 123 ng/mL (ref 12–200)
Troponin i, poc: <0.05 ng/mL (ref 0.00–0.09)

## 2011-03-19 LAB — DIFFERENTIAL
Eosinophils Absolute: 0.1 10*3/uL (ref 0.0–0.7)
Eosinophils Relative: 1 % (ref 0–5)
Lymphs Abs: 2.7 10*3/uL (ref 0.7–4.0)

## 2011-03-19 LAB — CBC
MCH: 30 pg (ref 26.0–34.0)
MCV: 90.5 fL (ref 78.0–100.0)
Platelets: 437 10*3/uL — ABNORMAL HIGH (ref 150–400)
RDW: 12.5 % (ref 11.5–15.5)
WBC: 10 10*3/uL (ref 4.0–10.5)

## 2011-03-19 MED ORDER — METOPROLOL TARTRATE 50 MG PO TABS: ORAL_TABLET | ORAL | Status: DC

## 2011-03-19 MED ORDER — IOHEXOL 300 MG/ML  SOLN
100.0000 mL | Freq: Once | INTRAMUSCULAR | Status: AC | PRN
  Administered 2011-03-19: 100 mL via INTRAVENOUS

## 2011-03-19 NOTE — Assessment & Plan Note (Signed)
Patient had CABG after her MI. I don't believe her current shortness of breath is ischemic related but more related to her atrial fibrillation and pneumonia. Dr. Riley Kill to reevaluate need for a possible stress test in the future.

## 2011-03-19 NOTE — Progress Notes (Signed)
Pacer check

## 2011-03-19 NOTE — Patient Instructions (Signed)
Your physician recommends that you schedule a follow-up appointment in: 1-2 weeks with Dr. Ladona Ridgel.  Keep scheduled appointment with Dr. Riley Kill in June. Your physician has recommended you make the following change in your medication: Increase metoprolol to 100 mg every morning and 50 mg every evening.

## 2011-03-19 NOTE — Assessment & Plan Note (Signed)
Patient had an episode of atrial fibrillation that lasted longer than usual and was overriding her pacemaker. She needs better rate control. I believe this occurred because of her possible pneumonia and coughing over the past week. Greater fibrillation has been less than 1% over the past several months on pacer check. We will increase her metoprolol 200 mg in the morning 50 in the evening. She will see Dr. Ladona Ridgel back in the next week or 2 and already has an appointment with Dr. Riley Kill in June. She denies chest pain. I will hold off any stress testing or further assessment of coronary disease until she sees Dr. Riley Kill back.

## 2011-03-19 NOTE — Assessment & Plan Note (Signed)
Patient had bypass surgery one week after her MI and drug-eluting stents to the LAD. She has done well up until now. I believe the episode she had today is mostly related to her age her fibrillation and pneumonia. Dr. Riley Kill can decide whether she needs to proceed with a stress test once she recovers from her pneumonia.She is to call she has any further shortness of breath or chest pain.

## 2011-03-19 NOTE — Assessment & Plan Note (Signed)
Patient is overdue for her pacemaker follow up with Dr. Ladona Ridgel. We'll schedule appointment for this. I have increased her metoprolol because rate or fibrillation was overriding her pacemaker.

## 2011-03-19 NOTE — Progress Notes (Signed)
HPI  This is a 70 year old white female patient who has history of coronary artery disease status post ST elevation MI November 03, 2009 we treated with drug-eluting stents x3 to the LAD. She has residual moderate high-grade residual disease involving the ostium of the LAD extending up to the left main circumflex interface of about 60%. There was total occlusion of the RCA in its midportion with collaterals of the distal vessel. At the time of presentation she had acute pulmonary edema. She eventually had bypass surgery and pacemaker inserted.She also had paroxysmal atrial fibrillation.  Patient complains of a cold and cough developing approximately 2 weeks ago. The patient woke up at 2 in the morning short of breath and felt her heart racing and felt uncomfortable in her chest. She said she coughed until she felt like she coughed up some mucous plug. She continued to feel poorly so went to the emergency room where she was found to be in atrial fibrillation. Cardiac enzymes are negative. Chest x-ray showed no acute hernia pulmonary abnormality. D-dimer was elevated. CT of the chest showed major airways are patent there is a subtle 5-6 mm nodule in the right lung that is stable there was no pulmonary consolidation. Nothing to suggest acute pulmonary embolus. The patient was told she did have pneumonia and was given Rocephin and on oral antibiotics to go home with. She was sent here for further evaluation of possible chest pain and dyspnea.  The patient is feeling better she feels like she converted to sinus rhythm and her breathing is normal.  The patient denies chest pain but never had chest pain when she had her MI. She said she just was uncomfortable with a rapid heartbeat and then shortness of breath. She has not gone to maintenance cardiac rehabilitation in the past 2 weeks because of the cough. Prior to that she has had no trouble with dyspnea on exertion or chest pain.  Allergies no known  allergies  Current Outpatient Prescriptions on File Prior to Visit  Medication Sig Dispense Refill  . pantoprazole (PROTONIX) 40 MG tablet Take 1 tablet (40 mg total) by mouth daily.  90 tablet  3  . warfarin (COUMADIN) 2.5 MG tablet Take 1 tablet (2.5 mg total) by mouth as directed.  135 tablet  1   Current Facility-Administered Medications on File Prior to Visit  Medication Dose Route Frequency Provider Last Rate Last Dose  . iohexol (OMNIPAQUE) 300 MG/ML injection 100 mL  100 mL Intravenous Once PRN Medication Radiologist   100 mL at 03/19/11 0523    No past medical history on file.  No past surgical history on file.  No family history on file.  History   Social History  . Marital Status: Married    Spouse Name: N/A    Number of Children: N/A  . Years of Education: N/A   Occupational History  . Not on file.   Social History Main Topics  . Smoking status: Former Smoker    Quit date: 11/02/2009  . Smokeless tobacco: Not on file  . Alcohol Use: Not on file  . Drug Use: Not on file  . Sexually Active: Not on file   Other Topics Concern  . Not on file   Social History Narrative  . No narrative on file    ROS: See HPI Eyes: Negative Ears:Negative for hearing loss, tinnitus Cardiovascular: Negative for  near-syncope, orthopnea, paroxysmal nocturnal dyspnia and syncope,edema, claudication, cyanosis,.  Respiratory:   Negative for cough, hemoptysis, shortness  of breath, sleep disturbances due to breathing, sputum production and wheezing.   Endocrine: Negative for cold intolerance and heat intolerance.  Hematologic/Lymphatic: Negative for adenopathy and bleeding problem. Does not bruise/bleed easily.  Musculoskeletal: Negative.   Gastrointestinal: Negative for nausea, vomiting, reflux, abdominal pain, diarrhea, constipation.   Genitourinary: Negative for bladder incontinence, dysuria, flank pain, frequency, hematuria, hesitancy, nocturia and urgency.  Neurological:  Negative.  Allergic/Immunologic: Negative for environmental allergies.   PHYSICAL EXAM B., in no acute distress. Neck: No JVD, HJR, Bruit, or thyroid enlargement Lungs: No tachypnea, clear without wheezing, rales, or rhonchi Cardiovascular: RRR, PMI not displaced, heart sounds normal, no murmurs, gallops, bruit, thrill, or heave. Abdomen: BS normal. Soft without organomegaly, masses, lesions or tenderness. Extremities: without cyanosis, clubbing or edema. Good distal pulses bilateral SKin: Warm, no lesions or rashes  Musculoskeletal: No deformities Neuro: no focal signs  BP 146/78  Pulse 71  Ht 5\' 4"  (1.626 m)  Wt 231 lb (104.781 kg)  BMI 39.65 kg/m2  EKG:EKG from the emergency room at 3 AM shows atrial fibrillation 104 beats per minute her pacer was checked here today and she is in sinus rhythm  ASSESSMENT AND PLAN:

## 2011-03-20 ENCOUNTER — Ambulatory Visit (INDEPENDENT_AMBULATORY_CARE_PROVIDER_SITE_OTHER): Payer: Medicare Other | Admitting: *Deleted

## 2011-03-20 ENCOUNTER — Encounter: Payer: Self-pay | Admitting: *Deleted

## 2011-03-20 DIAGNOSIS — I4891 Unspecified atrial fibrillation: Secondary | ICD-10-CM

## 2011-03-20 LAB — POCT INR: INR: 3

## 2011-03-24 ENCOUNTER — Encounter (HOSPITAL_COMMUNITY): Payer: Self-pay

## 2011-03-25 ENCOUNTER — Encounter: Payer: Self-pay | Admitting: Internal Medicine

## 2011-03-25 ENCOUNTER — Ambulatory Visit (INDEPENDENT_AMBULATORY_CARE_PROVIDER_SITE_OTHER): Payer: Medicare Other | Admitting: Internal Medicine

## 2011-03-25 ENCOUNTER — Encounter (HOSPITAL_COMMUNITY): Payer: Self-pay

## 2011-03-25 VITALS — BP 128/62 | HR 67 | Temp 98.6°F | Ht 63.5 in | Wt 227.0 lb

## 2011-03-25 DIAGNOSIS — I4891 Unspecified atrial fibrillation: Secondary | ICD-10-CM

## 2011-03-25 DIAGNOSIS — R002 Palpitations: Secondary | ICD-10-CM

## 2011-03-25 DIAGNOSIS — H6690 Otitis media, unspecified, unspecified ear: Secondary | ICD-10-CM

## 2011-03-25 DIAGNOSIS — H6693 Otitis media, unspecified, bilateral: Secondary | ICD-10-CM

## 2011-03-25 DIAGNOSIS — I10 Essential (primary) hypertension: Secondary | ICD-10-CM

## 2011-03-25 DIAGNOSIS — Z950 Presence of cardiac pacemaker: Secondary | ICD-10-CM

## 2011-03-25 DIAGNOSIS — I498 Other specified cardiac arrhythmias: Secondary | ICD-10-CM

## 2011-03-25 DIAGNOSIS — H6980 Other specified disorders of Eustachian tube, unspecified ear: Secondary | ICD-10-CM

## 2011-03-25 MED ORDER — FLUTICASONE PROPIONATE 50 MCG/ACT NA SUSP: 1.0000 | Freq: Every day | NASAL | Status: DC

## 2011-03-25 MED ORDER — METOPROLOL SUCCINATE ER 100 MG PO TB24: 100.0000 mg | ORAL_TABLET | Freq: Every day | ORAL | Status: DC

## 2011-03-25 MED ORDER — METOPROLOL TARTRATE 50 MG PO TABS: ORAL_TABLET | ORAL | Status: DC

## 2011-03-25 MED ORDER — OXYMETAZOLINE HCL 0.05 % NA SOLN: 2.0000 | Freq: Two times a day (BID) | NASAL | Status: DC

## 2011-03-25 NOTE — Progress Notes (Signed)
Subjective:    Patient ID: Erica Hood, female    DOB: 1941-04-21, 70 y.o.   MRN: [id number]  HPI  complains of decreased hearing both ears Precipitated by URI symptoms began 3 weeks ago - seen in ER for same 5/17 rx'd antibiotics - last dose today - cough improved but not ear symptoms  Describes as "full" and "muffled" hearing - no ear pain or drainage Not associated with headache, no fever  Past Medical History  Diagnosis Date  . OBESITY   . CAD, ARTERY BYPASS GRAFT 11/2009  . LUNG NODULE     stable RLL nodule CT 04/2010 and 10/2010 and 03/2011  . 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 PAF (paroxysmal atrial fibrillation)     chronic anticoag  . MYOCARDIAL INFARCTION, ACUTE, ANTERIOR WALL 11/2009  . Hypertension   . HYPERCHOLESTEROLEMIA  IIA   . Depression   . ARTHRITIS, CERVICAL SPINE   . Thyroid nodule 2011    neg bx 01/2010     Review of Systems  Constitutional: Negative for fever.  HENT: Positive for postnasal drip. Negative for ear pain, nosebleeds, congestion, rhinorrhea and tinnitus.   Eyes: Negative for visual disturbance.  Respiratory: Negative for shortness of breath.   Cardiovascular: Negative for chest pain.  Neurological: Negative for headaches.       Objective:   Physical Exam BP 128/62  Pulse 67  Temp(Src) 98.6 F (37 C) (Oral)  Ht 5' 3.5" (1.613 m)  Wt 227 lb (102.967 kg)  BMI 39.58 kg/m2  SpO2 96% Physical Exam  Constitutional: She is obese; oriented to person, place, and time. She appears well-developed and well-nourished. No distress.  HENT: Head: Normocephalic and atraumatic. Ears; L TM with erythema and hazy effusion, R with air/fluid clear effusion and no erythema; Nose: Nose normal. Mouth/Throat: Oropharynx is clear and moist. No oropharyngeal exudate.  Eyes: Conjunctivae and EOM are normal. Pupils are equal, round, and reactive to light. No scleral icterus.  Neck: Normal range of motion. Neck supple. No JVD present. No  thyromegaly present.  Cardiovascular: Normal rate, regular rhythm and normal heart sounds.  No murmur heard. No BLE edema. Pulmonary/Chest: Effort normal and breath sounds normal. No respiratory distress. She has no wheezes.  Neurological: She is alert and oriented to person, place, and time. No cranial nerve deficit. Coordination normal.  Skin: Skin is warm and dry. No rash noted. No erythema.  Psychiatric: She has a normal mood and affect. Her behavior is normal. Judgment and thought content normal.   Lab Results  Component Value Date   WBC 10.0 03/19/2011   HGB 13.3 03/19/2011   HCT 40.2 03/19/2011   PLT 437* 03/19/2011   CHOL 140 02/03/2010   TRIG 81.0 02/03/2010   HDL 53.60 02/03/2010   ALT 23 04/03/2010   AST 22 04/03/2010   NA 140 03/19/2011   K 4.3 03/19/2011   CL 101 03/19/2011   CREATININE 0.73 03/19/2011   BUN 21 03/19/2011   CO2 29 03/19/2011   TSH 1.92 09/30/2010   INR 3.0 03/20/2011   HGBA1C  Value: 6.2 (NOTE) The ADA recommends the following therapeutic goal for glycemic control related to Hgb A1c measurement: Goal of therapy: <6.5 Hgb A1c  Reference: American Diabetes Association: Clinical Practice Recommendations 2010, Diabetes Care, 2010, 33: (Suppl  1).* 11/03/2009          Assessment & Plan:  B otitis media with decreased hearing - complicated by eustachian tube dysfunction -  precipitated by URI - completing 10d abx today - will not extend course as afebrile - tx with topical steroids/decongestant to minimize systemic effects but hopeful decongest and reduce inflammation to allow drainage - if worse or unimproved, pt will call for refer to ENT - erx flonase done

## 2011-03-25 NOTE — Patient Instructions (Signed)
It was good to see you today. If you develop worsening symptoms or fever, call and we can reconsider additional antibiotics, but it does not appear necessary to extend use of antibiotics at this time. Flonase steroid spray and Afrin nasal decongestant to help with inflammation as discussed - Your prescription(s) have been submitted to your pharmacy. Please take as directed and contact our office if you believe you are having problem(s) with the medication(s). If symptoms worse or unimproved with treatment as here, call for refer to ENT as discussed

## 2011-03-25 NOTE — Patient Instructions (Signed)
Your physician wants you to follow-up in: 12 months with Dr. Ladona Ridgel.You will receive a reminder letter in the mail two months in advance. If you don't receive a letter, please call our office to schedule the follow-up appointment. Remote monitoring is used to monitor your Pacemaker of ICD from home. This monitoring reduces the number of office visits required to check your device to one time per year. It allows [place] to keep an eye on the functioning of your device to ensure it is working properly. You are scheduled for a device check from home on June 25, 2011. You may send your transmission at any time that day. If you have a wireless device, the transmission will be sent automatically. After your physician reviews your transmission, you will receive a postcard with your next transmission date.  Your physician has recommended you make the following change in your medication: Continue metoprolol succinate 100 mg by mouth daily. Take metoprolol tartrate 50 mg by mouth as needed for rapid atrial fibrillation.

## 2011-03-26 ENCOUNTER — Encounter: Payer: Self-pay | Admitting: Internal Medicine

## 2011-03-26 ENCOUNTER — Encounter (HOSPITAL_COMMUNITY): Payer: Self-pay

## 2011-03-26 NOTE — Progress Notes (Signed)
HPI Erica Hood returns today for followup. She is a 70 year old woman with known coronary disease, hypertension, morbid obesity, and symptomatic bradycardia, status post permanent pacemaker insertion. The patient had a bronchitis exacerbation several weeks ago and subsequently developed atrial fibrillation with a rapid ventricular response. This has resolved. Her bronchitis is improved. She denies fevers and chills. At night, she has occasional palpitations. No anginal symptoms. Her dyspnea is multifactorial and presently class II No Known Allergies   Current Outpatient Prescriptions  Medication Sig Dispense Refill  . amLODipine (NORVASC) 10 MG tablet Take 10 mg by mouth daily.        Marland Kitchen aspirin 81 MG tablet Take 81 mg by mouth daily.        . cefpodoxime (VANTIN) 200 MG tablet Take 200 mg by mouth 2 (two) times daily.        Marland Kitchen lisinopril (PRINIVIL,ZESTRIL) 20 MG tablet Take 20 mg by mouth daily.        . metoprolol (TOPROL-XL) 100 MG 24 hr tablet Take 1 tablet (100 mg total) by mouth daily.  90 tablet  3  . pantoprazole (PROTONIX) 40 MG tablet Take 1 tablet (40 mg total) by mouth daily.  90 tablet  3  . warfarin (COUMADIN) 2.5 MG tablet Take 1 tablet (2.5 mg total) by mouth as directed.  135 tablet  1  . acetaminophen (TYLENOL) 500 MG tablet Take 500 mg by mouth as needed.        . fluticasone (FLONASE) 50 MCG/ACT nasal spray 1 spray by Nasal route daily.  16 g  2  . metoprolol (LOPRESSOR) 50 MG tablet Take one tablet by mouth as needed for fast atrial fibrillation  30 tablet  0  . oxymetazoline (AFRIN) 0.05 % nasal spray 2 sprays by Nasal route 2 (two) times daily.  15 mL  2     Past Medical History  Diagnosis Date  . OBESITY   . CAD, ARTERY BYPASS GRAFT 11/2009  . LUNG NODULE     stable RLL nodule CT 04/2010 and 10/2010 and 03/2011  . 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 PAF (paroxysmal atrial fibrillation)     chronic anticoag  . MYOCARDIAL INFARCTION, ACUTE, ANTERIOR  WALL 11/2009  . Hypertension   . HYPERCHOLESTEROLEMIA  IIA   . Depression   . ARTHRITIS, CERVICAL SPINE   . Thyroid nodule 2011    neg bx 01/2010    ROS:   All systems reviewed and negative except as noted in the HPI.   Past Surgical History  Procedure Date  . Coronary artery bypass graft 11-2009  . Cholecystectomy   . Appendectomy   . Breast lumpectomy 1967    right breast (bening)  . Tonsillectomy   . Angioplasty     ans stenting of her LAD     Family History  Problem Relation Age of Onset  . Other      Significant for premature cardiovascular disease affecting her father in his 57's  . Alcohol abuse      family hx addiction (parent)  . Arthritis      parent  . Hypertension      parent and other relative  . Cervical cancer Mother   . Hypothyroidism Daughter      History   Social History  . Marital Status: Married    Spouse Name: N/A    Number of Children: N/A  . Years of Education: N/A   Occupational History  .  retired     Archivist   Social History Main Topics  . Smoking status: Never Smoker   . Smokeless tobacco: Not on file  . Alcohol Use: No  . Drug Use: No  . Sexually Active: Not on file   Other Topics Concern  . Not on file   Social History Narrative  . No narrative on file     BP 100/60  Pulse 62  Ht 5\' 3"  (1.6 m)  Wt 226 lb (102.513 kg)  BMI 40.03 kg/m2  Physical Exam:  Obese,Well appearing NAD HEENT: Unremarkable Neck:  No JVD, no thyromegally Lymphatics:  No adenopathy Back:  No CVA tenderness Lungs:  Clear. Well-healed pacemaker incision. HEART:  Regular rate rhythm, no murmurs, no rubs, no clicks Abd:  Obese, positive bowel sounds, no organomegally, no rebound, no guarding Ext:  2 plus pulses, no edema, no cyanosis, no clubbing Skin:  No rashes no nodules Neuro:  CN II through XII intact, motor grossly intact DEVICE  Normal device function.  See PaceArt for details.   Assess/Plan:

## 2011-03-26 NOTE — Assessment & Plan Note (Signed)
The patient's symptoms are currently well-controlled. I would not recommend adding an additional antiarrhythmic drug at this time. She will continue her current medications.

## 2011-03-26 NOTE — Assessment & Plan Note (Signed)
Her blood pressure appears to be well-controlled. I've asked that she maintain a low-sodium diet and continue her current medications.

## 2011-03-26 NOTE — Assessment & Plan Note (Signed)
Her device is working normally. Will recheck in several months. Her histograms demonstrate no ventricular arrhythmias and no atrial fibrillation other than previously mentioned.

## 2011-03-31 ENCOUNTER — Encounter (HOSPITAL_COMMUNITY): Payer: Self-pay

## 2011-04-01 ENCOUNTER — Encounter (HOSPITAL_COMMUNITY): Payer: Self-pay

## 2011-04-02 ENCOUNTER — Encounter (HOSPITAL_COMMUNITY): Payer: Self-pay

## 2011-04-07 ENCOUNTER — Encounter (HOSPITAL_COMMUNITY): Payer: Self-pay | Attending: Cardiology

## 2011-04-07 DIAGNOSIS — I509 Heart failure, unspecified: Secondary | ICD-10-CM | POA: Insufficient documentation

## 2011-04-07 DIAGNOSIS — F17200 Nicotine dependence, unspecified, uncomplicated: Secondary | ICD-10-CM | POA: Insufficient documentation

## 2011-04-07 DIAGNOSIS — Z8249 Family history of ischemic heart disease and other diseases of the circulatory system: Secondary | ICD-10-CM | POA: Insufficient documentation

## 2011-04-07 DIAGNOSIS — E785 Hyperlipidemia, unspecified: Secondary | ICD-10-CM | POA: Insufficient documentation

## 2011-04-07 DIAGNOSIS — Z7902 Long term (current) use of antithrombotics/antiplatelets: Secondary | ICD-10-CM | POA: Insufficient documentation

## 2011-04-07 DIAGNOSIS — I4892 Unspecified atrial flutter: Secondary | ICD-10-CM | POA: Insufficient documentation

## 2011-04-07 DIAGNOSIS — E7800 Pure hypercholesterolemia, unspecified: Secondary | ICD-10-CM | POA: Insufficient documentation

## 2011-04-07 DIAGNOSIS — I651 Occlusion and stenosis of basilar artery: Secondary | ICD-10-CM | POA: Insufficient documentation

## 2011-04-07 DIAGNOSIS — Z951 Presence of aortocoronary bypass graft: Secondary | ICD-10-CM | POA: Insufficient documentation

## 2011-04-07 DIAGNOSIS — I6529 Occlusion and stenosis of unspecified carotid artery: Secondary | ICD-10-CM | POA: Insufficient documentation

## 2011-04-07 DIAGNOSIS — I10 Essential (primary) hypertension: Secondary | ICD-10-CM | POA: Insufficient documentation

## 2011-04-07 DIAGNOSIS — I4891 Unspecified atrial fibrillation: Secondary | ICD-10-CM | POA: Insufficient documentation

## 2011-04-07 DIAGNOSIS — I2589 Other forms of chronic ischemic heart disease: Secondary | ICD-10-CM | POA: Insufficient documentation

## 2011-04-07 DIAGNOSIS — K219 Gastro-esophageal reflux disease without esophagitis: Secondary | ICD-10-CM | POA: Insufficient documentation

## 2011-04-07 DIAGNOSIS — I2510 Atherosclerotic heart disease of native coronary artery without angina pectoris: Secondary | ICD-10-CM | POA: Insufficient documentation

## 2011-04-07 DIAGNOSIS — Z7982 Long term (current) use of aspirin: Secondary | ICD-10-CM | POA: Insufficient documentation

## 2011-04-07 DIAGNOSIS — E669 Obesity, unspecified: Secondary | ICD-10-CM | POA: Insufficient documentation

## 2011-04-07 DIAGNOSIS — Z5189 Encounter for other specified aftercare: Secondary | ICD-10-CM | POA: Insufficient documentation

## 2011-04-07 DIAGNOSIS — I252 Old myocardial infarction: Secondary | ICD-10-CM | POA: Insufficient documentation

## 2011-04-07 DIAGNOSIS — I2582 Chronic total occlusion of coronary artery: Secondary | ICD-10-CM | POA: Insufficient documentation

## 2011-04-07 DIAGNOSIS — Z9861 Coronary angioplasty status: Secondary | ICD-10-CM | POA: Insufficient documentation

## 2011-04-08 ENCOUNTER — Encounter (HOSPITAL_COMMUNITY): Payer: Self-pay

## 2011-04-09 ENCOUNTER — Encounter (HOSPITAL_COMMUNITY): Payer: Self-pay

## 2011-04-14 ENCOUNTER — Encounter (HOSPITAL_COMMUNITY): Payer: Self-pay

## 2011-04-15 ENCOUNTER — Encounter (HOSPITAL_COMMUNITY): Payer: Self-pay

## 2011-04-16 ENCOUNTER — Encounter (HOSPITAL_COMMUNITY): Payer: Self-pay

## 2011-04-17 ENCOUNTER — Ambulatory Visit (INDEPENDENT_AMBULATORY_CARE_PROVIDER_SITE_OTHER): Payer: Medicare Other | Admitting: *Deleted

## 2011-04-17 DIAGNOSIS — I4891 Unspecified atrial fibrillation: Secondary | ICD-10-CM

## 2011-04-21 ENCOUNTER — Encounter (HOSPITAL_COMMUNITY): Payer: Self-pay

## 2011-04-22 ENCOUNTER — Ambulatory Visit (INDEPENDENT_AMBULATORY_CARE_PROVIDER_SITE_OTHER): Payer: Medicare Other | Admitting: Cardiology

## 2011-04-22 ENCOUNTER — Encounter: Payer: Self-pay | Admitting: Cardiology

## 2011-04-22 ENCOUNTER — Encounter (HOSPITAL_COMMUNITY): Payer: Self-pay

## 2011-04-22 DIAGNOSIS — I2510 Atherosclerotic heart disease of native coronary artery without angina pectoris: Secondary | ICD-10-CM

## 2011-04-22 DIAGNOSIS — I10 Essential (primary) hypertension: Secondary | ICD-10-CM

## 2011-04-22 DIAGNOSIS — I4891 Unspecified atrial fibrillation: Secondary | ICD-10-CM

## 2011-04-22 DIAGNOSIS — E7800 Pure hypercholesterolemia, unspecified: Secondary | ICD-10-CM

## 2011-04-22 MED ORDER — LOSARTAN POTASSIUM 50 MG PO TABS: 50.0000 mg | ORAL_TABLET | Freq: Every day | ORAL | Status: DC

## 2011-04-22 NOTE — Patient Instructions (Signed)
Your physician recommends that you schedule a follow-up appointment in: 2 months with Dr. Riley Kill. Your physician has recommended you make the following change in your medication: Stop lisinopril. Start losartan 50 mg by mouth daily. Your physician recommends that you return for lab work 1 week after making change in medication.

## 2011-04-22 NOTE — Assessment & Plan Note (Signed)
Stable.  Not currently on statins.

## 2011-04-22 NOTE — Assessment & Plan Note (Signed)
Seems under control and on warfarin.

## 2011-04-22 NOTE — Assessment & Plan Note (Signed)
Had trouble with leg pain, still some, not sure if related to statin which she stopped.  Can readdress after we get the current issue resolved.

## 2011-04-22 NOTE — Progress Notes (Signed)
HPI: In for follow up.  She was stable.  She denies chest pain.  She has had recent episode where she could not breath.  Had nasal congestion, and mucus in airway.  Relieved.  Went to ER.  End up with CT scan.  See results.  Denies chest pain.  Rehab has helped both depression, and also with activity.    Current Outpatient Prescriptions  Medication Sig Dispense Refill  . acetaminophen (TYLENOL) 500 MG tablet Take 500 mg by mouth as needed.        Marland Kitchen amLODipine (NORVASC) 10 MG tablet Take 10 mg by mouth daily.        Marland Kitchen aspirin 81 MG tablet Take 81 mg by mouth daily.        . fluticasone (FLONASE) 50 MCG/ACT nasal spray 1 spray by Nasal route daily.  16 g  2  . lisinopril (PRINIVIL,ZESTRIL) 20 MG tablet Take 20 mg by mouth daily.        . metoprolol (LOPRESSOR) 50 MG tablet Take one tablet by mouth as needed for fast atrial fibrillation  30 tablet  0  . metoprolol (TOPROL-XL) 100 MG 24 hr tablet Take 1 tablet (100 mg total) by mouth daily.  90 tablet  3  . oxymetazoline (AFRIN) 0.05 % nasal spray 2 sprays by Nasal route 2 (two) times daily.  15 mL  2  . pantoprazole (PROTONIX) 40 MG tablet Take 1 tablet (40 mg total) by mouth daily.  90 tablet  3  . warfarin (COUMADIN) 2.5 MG tablet Take 1 tablet (2.5 mg total) by mouth as directed.  135 tablet  1  . DISCONTD: cefpodoxime (VANTIN) 200 MG tablet Take 200 mg by mouth 2 (two) times daily.          No Known Allergies  Past Medical History  Diagnosis Date  . OBESITY   . CAD, ARTERY BYPASS GRAFT 11/2009  . LUNG NODULE     stable RLL nodule CT 04/2010 and 10/2010 and 03/2011  . 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 PAF (paroxysmal atrial fibrillation)     chronic anticoag  . MYOCARDIAL INFARCTION, ACUTE, ANTERIOR WALL 11/2009  . Hypertension   . HYPERCHOLESTEROLEMIA  IIA   . Depression   . ARTHRITIS, CERVICAL SPINE   . Thyroid nodule 2011    neg bx 01/2010    Past Surgical History  Procedure Date  . Coronary artery bypass graft  11-2009  . Cholecystectomy   . Appendectomy   . Breast lumpectomy 1967    right breast (bening)  . Tonsillectomy   . Angioplasty     ans stenting of her LAD    Family History  Problem Relation Age of Onset  . Other      Significant for premature cardiovascular disease affecting her father in his 31's  . Alcohol abuse      family hx addiction (parent)  . Arthritis      parent  . Hypertension      parent and other relative  . Cervical cancer Mother   . Hypothyroidism Daughter     History   Social History  . Marital Status: Married    Spouse Name: N/A    Number of Children: N/A  . 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 Smoking status: Former Smoker    Quit date: 11/02/2009  .  Smokeless tobacco: Not on file  . Alcohol Use: No  . Drug Use: No  . Sexually Active: Not on file   Other Topics Concern  . Not on file   Social History Narrative  . No narrative on file    ROS: Please see the HPI.  All other systems reviewed and negative.  PHYSICAL EXAM:  BP 138/82  Pulse 64  Ht 5\' 4"  (1.626 m)  Wt 228 lb (103.42 kg)  BMI 39.14 kg/m2  General: Well developed, well nourished, in no acute distress. Head:  Normocephalic and atraumatic. Neck: no JVD Lungs: Clear to auscultation and percussion. Heart: Normal S1 and S2.  No murmur, rubs or gallops.  Abdomen:  Normal bowel sounds; soft; non tender; no organomegaly Pulses: Pulses normal in all 4 extremities. Extremities: No clubbing or cyanosis. No edema. Neurologic: Alert and oriented x 3.  EKG:   ASSESSMENT AND PLAN:

## 2011-04-22 NOTE — Assessment & Plan Note (Addendum)
Had some laryngeal issues on two occasions.  This was associated with nasal congestion and mucus, not likely related to lisinopril.  Does have an occasional cough.  Will switch to ARB at present and see how she does.  Told to watch BP as dose adjustment may be needed. Recheck BMET one week after taking.

## 2011-04-23 ENCOUNTER — Encounter (HOSPITAL_COMMUNITY): Payer: Self-pay

## 2011-04-28 ENCOUNTER — Encounter (HOSPITAL_COMMUNITY): Payer: Self-pay

## 2011-04-29 ENCOUNTER — Encounter (HOSPITAL_COMMUNITY): Payer: Self-pay

## 2011-04-30 ENCOUNTER — Encounter (HOSPITAL_COMMUNITY): Payer: Self-pay

## 2011-05-05 ENCOUNTER — Encounter (HOSPITAL_COMMUNITY): Payer: Self-pay | Attending: Cardiology

## 2011-05-05 DIAGNOSIS — I651 Occlusion and stenosis of basilar artery: Secondary | ICD-10-CM | POA: Insufficient documentation

## 2011-05-05 DIAGNOSIS — I2589 Other forms of chronic ischemic heart disease: Secondary | ICD-10-CM | POA: Insufficient documentation

## 2011-05-05 DIAGNOSIS — Z5189 Encounter for other specified aftercare: Secondary | ICD-10-CM | POA: Insufficient documentation

## 2011-05-05 DIAGNOSIS — K219 Gastro-esophageal reflux disease without esophagitis: Secondary | ICD-10-CM | POA: Insufficient documentation

## 2011-05-05 DIAGNOSIS — I6529 Occlusion and stenosis of unspecified carotid artery: Secondary | ICD-10-CM | POA: Insufficient documentation

## 2011-05-05 DIAGNOSIS — E7800 Pure hypercholesterolemia, unspecified: Secondary | ICD-10-CM | POA: Insufficient documentation

## 2011-05-05 DIAGNOSIS — I509 Heart failure, unspecified: Secondary | ICD-10-CM | POA: Insufficient documentation

## 2011-05-05 DIAGNOSIS — I4892 Unspecified atrial flutter: Secondary | ICD-10-CM | POA: Insufficient documentation

## 2011-05-05 DIAGNOSIS — E785 Hyperlipidemia, unspecified: Secondary | ICD-10-CM | POA: Insufficient documentation

## 2011-05-05 DIAGNOSIS — I4891 Unspecified atrial fibrillation: Secondary | ICD-10-CM | POA: Insufficient documentation

## 2011-05-05 DIAGNOSIS — F17200 Nicotine dependence, unspecified, uncomplicated: Secondary | ICD-10-CM | POA: Insufficient documentation

## 2011-05-05 DIAGNOSIS — Z9861 Coronary angioplasty status: Secondary | ICD-10-CM | POA: Insufficient documentation

## 2011-05-05 DIAGNOSIS — I2510 Atherosclerotic heart disease of native coronary artery without angina pectoris: Secondary | ICD-10-CM | POA: Insufficient documentation

## 2011-05-05 DIAGNOSIS — I252 Old myocardial infarction: Secondary | ICD-10-CM | POA: Insufficient documentation

## 2011-05-05 DIAGNOSIS — I10 Essential (primary) hypertension: Secondary | ICD-10-CM | POA: Insufficient documentation

## 2011-05-05 DIAGNOSIS — E669 Obesity, unspecified: Secondary | ICD-10-CM | POA: Insufficient documentation

## 2011-05-05 DIAGNOSIS — Z951 Presence of aortocoronary bypass graft: Secondary | ICD-10-CM | POA: Insufficient documentation

## 2011-05-05 DIAGNOSIS — Z7982 Long term (current) use of aspirin: Secondary | ICD-10-CM | POA: Insufficient documentation

## 2011-05-05 DIAGNOSIS — Z8249 Family history of ischemic heart disease and other diseases of the circulatory system: Secondary | ICD-10-CM | POA: Insufficient documentation

## 2011-05-05 DIAGNOSIS — Z7902 Long term (current) use of antithrombotics/antiplatelets: Secondary | ICD-10-CM | POA: Insufficient documentation

## 2011-05-05 DIAGNOSIS — I2582 Chronic total occlusion of coronary artery: Secondary | ICD-10-CM | POA: Insufficient documentation

## 2011-05-06 ENCOUNTER — Encounter (HOSPITAL_COMMUNITY): Payer: Self-pay

## 2011-05-07 ENCOUNTER — Encounter (HOSPITAL_COMMUNITY): Payer: Self-pay

## 2011-05-08 ENCOUNTER — Encounter (HOSPITAL_COMMUNITY): Payer: Self-pay

## 2011-05-12 ENCOUNTER — Encounter (HOSPITAL_COMMUNITY): Payer: Self-pay

## 2011-05-13 ENCOUNTER — Encounter (HOSPITAL_COMMUNITY): Payer: Self-pay

## 2011-05-13 ENCOUNTER — Other Ambulatory Visit (INDEPENDENT_AMBULATORY_CARE_PROVIDER_SITE_OTHER): Payer: Medicare Other | Admitting: *Deleted

## 2011-05-13 DIAGNOSIS — I2510 Atherosclerotic heart disease of native coronary artery without angina pectoris: Secondary | ICD-10-CM

## 2011-05-13 DIAGNOSIS — I10 Essential (primary) hypertension: Secondary | ICD-10-CM

## 2011-05-13 LAB — BASIC METABOLIC PANEL
BUN: 23 mg/dL (ref 6–23)
Calcium: 9.5 mg/dL (ref 8.4–10.5)
GFR: 68.81 mL/min (ref >60.00)
Glucose, Bld: 72 mg/dL (ref 70–99)

## 2011-05-14 ENCOUNTER — Encounter (HOSPITAL_COMMUNITY): Payer: Self-pay

## 2011-05-15 ENCOUNTER — Ambulatory Visit (INDEPENDENT_AMBULATORY_CARE_PROVIDER_SITE_OTHER): Payer: Medicare Other | Admitting: *Deleted

## 2011-05-15 DIAGNOSIS — I4891 Unspecified atrial fibrillation: Secondary | ICD-10-CM

## 2011-05-19 ENCOUNTER — Encounter (HOSPITAL_COMMUNITY): Payer: Self-pay

## 2011-05-20 ENCOUNTER — Encounter (HOSPITAL_COMMUNITY): Payer: Self-pay

## 2011-05-21 ENCOUNTER — Encounter (HOSPITAL_COMMUNITY): Payer: Self-pay

## 2011-05-26 ENCOUNTER — Encounter (HOSPITAL_COMMUNITY): Payer: Self-pay

## 2011-05-27 ENCOUNTER — Encounter (HOSPITAL_COMMUNITY): Payer: Self-pay

## 2011-05-28 ENCOUNTER — Encounter (HOSPITAL_COMMUNITY): Payer: Self-pay

## 2011-06-02 ENCOUNTER — Encounter (HOSPITAL_COMMUNITY): Payer: Self-pay

## 2011-06-03 ENCOUNTER — Encounter (HOSPITAL_COMMUNITY): Payer: Self-pay | Attending: Cardiology

## 2011-06-03 DIAGNOSIS — I509 Heart failure, unspecified: Secondary | ICD-10-CM | POA: Insufficient documentation

## 2011-06-03 DIAGNOSIS — Z5189 Encounter for other specified aftercare: Secondary | ICD-10-CM | POA: Insufficient documentation

## 2011-06-03 DIAGNOSIS — I252 Old myocardial infarction: Secondary | ICD-10-CM | POA: Insufficient documentation

## 2011-06-03 DIAGNOSIS — I2589 Other forms of chronic ischemic heart disease: Secondary | ICD-10-CM | POA: Insufficient documentation

## 2011-06-03 DIAGNOSIS — I6529 Occlusion and stenosis of unspecified carotid artery: Secondary | ICD-10-CM | POA: Insufficient documentation

## 2011-06-03 DIAGNOSIS — I4891 Unspecified atrial fibrillation: Secondary | ICD-10-CM | POA: Insufficient documentation

## 2011-06-03 DIAGNOSIS — Z7902 Long term (current) use of antithrombotics/antiplatelets: Secondary | ICD-10-CM | POA: Insufficient documentation

## 2011-06-03 DIAGNOSIS — Z7982 Long term (current) use of aspirin: Secondary | ICD-10-CM | POA: Insufficient documentation

## 2011-06-03 DIAGNOSIS — E7800 Pure hypercholesterolemia, unspecified: Secondary | ICD-10-CM | POA: Insufficient documentation

## 2011-06-03 DIAGNOSIS — I10 Essential (primary) hypertension: Secondary | ICD-10-CM | POA: Insufficient documentation

## 2011-06-03 DIAGNOSIS — E669 Obesity, unspecified: Secondary | ICD-10-CM | POA: Insufficient documentation

## 2011-06-03 DIAGNOSIS — I2582 Chronic total occlusion of coronary artery: Secondary | ICD-10-CM | POA: Insufficient documentation

## 2011-06-03 DIAGNOSIS — Z9861 Coronary angioplasty status: Secondary | ICD-10-CM | POA: Insufficient documentation

## 2011-06-03 DIAGNOSIS — F17200 Nicotine dependence, unspecified, uncomplicated: Secondary | ICD-10-CM | POA: Insufficient documentation

## 2011-06-03 DIAGNOSIS — I2510 Atherosclerotic heart disease of native coronary artery without angina pectoris: Secondary | ICD-10-CM | POA: Insufficient documentation

## 2011-06-03 DIAGNOSIS — I651 Occlusion and stenosis of basilar artery: Secondary | ICD-10-CM | POA: Insufficient documentation

## 2011-06-03 DIAGNOSIS — Z8249 Family history of ischemic heart disease and other diseases of the circulatory system: Secondary | ICD-10-CM | POA: Insufficient documentation

## 2011-06-03 DIAGNOSIS — Z951 Presence of aortocoronary bypass graft: Secondary | ICD-10-CM | POA: Insufficient documentation

## 2011-06-03 DIAGNOSIS — K219 Gastro-esophageal reflux disease without esophagitis: Secondary | ICD-10-CM | POA: Insufficient documentation

## 2011-06-03 DIAGNOSIS — I4892 Unspecified atrial flutter: Secondary | ICD-10-CM | POA: Insufficient documentation

## 2011-06-03 DIAGNOSIS — E785 Hyperlipidemia, unspecified: Secondary | ICD-10-CM | POA: Insufficient documentation

## 2011-06-04 ENCOUNTER — Encounter (HOSPITAL_COMMUNITY): Payer: Self-pay

## 2011-06-09 ENCOUNTER — Encounter (HOSPITAL_COMMUNITY): Payer: Self-pay

## 2011-06-10 ENCOUNTER — Encounter (HOSPITAL_COMMUNITY): Payer: Self-pay

## 2011-06-11 ENCOUNTER — Encounter (HOSPITAL_COMMUNITY): Payer: Self-pay

## 2011-06-12 ENCOUNTER — Ambulatory Visit (INDEPENDENT_AMBULATORY_CARE_PROVIDER_SITE_OTHER): Payer: Medicare Other | Admitting: *Deleted

## 2011-06-12 DIAGNOSIS — I4891 Unspecified atrial fibrillation: Secondary | ICD-10-CM

## 2011-06-16 ENCOUNTER — Encounter (HOSPITAL_COMMUNITY): Payer: Self-pay

## 2011-06-17 ENCOUNTER — Encounter (HOSPITAL_COMMUNITY): Payer: Self-pay

## 2011-06-18 ENCOUNTER — Encounter (HOSPITAL_COMMUNITY): Payer: Self-pay

## 2011-06-23 ENCOUNTER — Encounter (HOSPITAL_COMMUNITY): Payer: Self-pay

## 2011-06-24 ENCOUNTER — Encounter (HOSPITAL_COMMUNITY): Payer: Self-pay

## 2011-06-25 ENCOUNTER — Ambulatory Visit (INDEPENDENT_AMBULATORY_CARE_PROVIDER_SITE_OTHER): Payer: Medicare Other | Admitting: *Deleted

## 2011-06-25 ENCOUNTER — Encounter (HOSPITAL_COMMUNITY): Payer: Self-pay

## 2011-06-25 DIAGNOSIS — I498 Other specified cardiac arrhythmias: Secondary | ICD-10-CM

## 2011-06-25 DIAGNOSIS — R001 Bradycardia, unspecified: Secondary | ICD-10-CM

## 2011-06-30 ENCOUNTER — Encounter (HOSPITAL_COMMUNITY): Payer: Self-pay

## 2011-07-01 ENCOUNTER — Ambulatory Visit (INDEPENDENT_AMBULATORY_CARE_PROVIDER_SITE_OTHER): Payer: Medicare Other | Admitting: Cardiology

## 2011-07-01 ENCOUNTER — Encounter (HOSPITAL_COMMUNITY): Payer: Self-pay

## 2011-07-01 ENCOUNTER — Encounter: Payer: Self-pay | Admitting: Internal Medicine

## 2011-07-01 ENCOUNTER — Encounter: Payer: Self-pay | Admitting: Cardiology

## 2011-07-01 ENCOUNTER — Other Ambulatory Visit: Payer: Self-pay | Admitting: Internal Medicine

## 2011-07-01 DIAGNOSIS — I10 Essential (primary) hypertension: Secondary | ICD-10-CM

## 2011-07-01 DIAGNOSIS — E785 Hyperlipidemia, unspecified: Secondary | ICD-10-CM

## 2011-07-01 DIAGNOSIS — Z79899 Other long term (current) drug therapy: Secondary | ICD-10-CM

## 2011-07-01 DIAGNOSIS — I2510 Atherosclerotic heart disease of native coronary artery without angina pectoris: Secondary | ICD-10-CM

## 2011-07-01 LAB — REMOTE PACEMAKER DEVICE
AL AMPLITUDE: 4.2 mv
BAMS-0001: 150 {beats}/min
BAMS-0003: 70 {beats}/min
BATTERY VOLTAGE: 2.96 V
VENTRICULAR PACING PM: 1

## 2011-07-01 MED ORDER — LOSARTAN POTASSIUM 50 MG PO TABS: 50.0000 mg | ORAL_TABLET | Freq: Every day | ORAL | Status: DC

## 2011-07-01 MED ORDER — PRAVASTATIN SODIUM 20 MG PO TABS: 20.0000 mg | ORAL_TABLET | Freq: Every day | ORAL | Status: DC

## 2011-07-01 NOTE — Progress Notes (Signed)
HPI:  She is doing quite well.  She denies chest pain.  Off statins and generally feels better.  Tolerating her meds.  She is scheduled to go on a cruise.  Is in the maintenance program at [facility] rehab, and loves it.  Getting around better.  Denies progressive symptoms.   Current Outpatient Prescriptions  Medication Sig Dispense Refill  . acetaminophen (TYLENOL) 500 MG tablet Take 500 mg by mouth as needed.        Marland Kitchen amLODipine (NORVASC) 10 MG tablet Take 10 mg by mouth daily.        Marland Kitchen aspirin 81 MG tablet Take 81 mg by mouth daily.        . fluticasone (FLONASE) 50 MCG/ACT nasal spray 1 spray by Nasal route daily.  16 g  2  . losartan (COZAAR) 50 MG tablet Take 1 tablet (50 mg total) by mouth daily.  60 tablet  0  . metoprolol (TOPROL-XL) 100 MG 24 hr tablet Take 1 tablet (100 mg total) by mouth daily.  90 tablet  3  . oxymetazoline (AFRIN) 0.05 % nasal spray 2 sprays by Nasal route 2 (two) times daily.  15 mL  2  . pantoprazole (PROTONIX) 40 MG tablet Take 1 tablet (40 mg total) by mouth daily.  90 tablet  3  . warfarin (COUMADIN) 2.5 MG tablet Take 1 tablet (2.5 mg total) by mouth as directed.  135 tablet  1    No Known Allergies  Past Medical History  Diagnosis Date  . OBESITY   . CAD, ARTERY BYPASS GRAFT 11/2009  . LUNG NODULE     stable RLL nodule CT 04/2010 and 10/2010 and 03/2011  . 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 PAF (paroxysmal atrial fibrillation)     chronic anticoag  . MYOCARDIAL INFARCTION, ACUTE, ANTERIOR WALL 11/2009  . Hypertension   . HYPERCHOLESTEROLEMIA  IIA   . Depression   . ARTHRITIS, CERVICAL SPINE   . Thyroid nodule 2011    neg bx 01/2010    Past Surgical History  Procedure Date  . Coronary artery bypass graft 11-2009  . Cholecystectomy   . Appendectomy   . Breast lumpectomy 1967    right breast (bening)  . Tonsillectomy   . Angioplasty     ans stenting of her LAD    Family History  Problem Relation Age of Onset  . Other     Significant for premature cardiovascular disease affecting her father in his 29's  . Alcohol abuse      family hx addiction (parent)  . Arthritis      parent  . Hypertension      parent and other relative  . Cervical cancer Mother   . Hypothyroidism Daughter     History   Social History  . Marital Status: Married    Spouse Name: N/A    Number of Children: N/A  . 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 Smoking status: Former Smoker    Quit date: 11/02/2009  . Smokeless tobacco: Not on file  . Alcohol Use: No  . Drug Use: No  . Sexually Active: Not on file   Other Topics Concern  . Not on file   Social History Narrative  . No narrative on file    ROS: Please see the HPI.  All other systems reviewed and  negative.  PHYSICAL EXAM:  BP 146/82  Pulse 62  Ht 5\' 1"  (1.549 m)  Wt 231 lb (104.781 kg)  BMI 43.65 kg/m2  General: Well developed, well nourished, in no acute distress. Head:  Normocephalic and atraumatic. Neck: no JVD Lungs: Clear to auscultation and percussion. Heart: Normal S1 and S2.  No murmur, rubs or gallops.  Pulses: Pulses normal in all 4 extremities. Extremities: No clubbing or cyanosis. No edema. Neurologic: Alert and oriented x 3.  EKG:  ASSESSMENT AND PLAN:

## 2011-07-01 NOTE — Patient Instructions (Addendum)
Your physician recommends that you schedule a follow-up appointment in: 2 MONTHS WITH DR Riley Kill  Your physician has recommended you make the following change in your medication: START PRAVASTATIN 20 MG EVERY DAY  Your physician recommends that you return for lab work in: FASTING IN 6 WEEKS  LIPID LIVER DX 272.4 V58.69

## 2011-07-01 NOTE — Assessment & Plan Note (Signed)
Tolerating warfarin.  Has what appears to be bursts of atrial fibrillation.

## 2011-07-01 NOTE — Assessment & Plan Note (Signed)
Much improved on losartan, and the BP is much better, often in the 120/70 range.

## 2011-07-01 NOTE — Assessment & Plan Note (Signed)
No recurrent symptoms.  Continue medical therapy.  

## 2011-07-01 NOTE — Assessment & Plan Note (Signed)
Did not tolerate either Crestor or Lipitor very well due to leg cramps.  She is able to do six laps without any difficulty.  Will give another attempt at pravachol 20mg .  If it causes symptoms, she will quit.

## 2011-07-02 ENCOUNTER — Encounter (HOSPITAL_COMMUNITY): Payer: Self-pay

## 2011-07-07 ENCOUNTER — Encounter (HOSPITAL_COMMUNITY): Payer: Self-pay | Attending: Cardiology

## 2011-07-07 DIAGNOSIS — I4892 Unspecified atrial flutter: Secondary | ICD-10-CM | POA: Insufficient documentation

## 2011-07-07 DIAGNOSIS — I6529 Occlusion and stenosis of unspecified carotid artery: Secondary | ICD-10-CM | POA: Insufficient documentation

## 2011-07-07 DIAGNOSIS — I252 Old myocardial infarction: Secondary | ICD-10-CM | POA: Insufficient documentation

## 2011-07-07 DIAGNOSIS — I651 Occlusion and stenosis of basilar artery: Secondary | ICD-10-CM | POA: Insufficient documentation

## 2011-07-07 DIAGNOSIS — Z7982 Long term (current) use of aspirin: Secondary | ICD-10-CM | POA: Insufficient documentation

## 2011-07-07 DIAGNOSIS — Z9861 Coronary angioplasty status: Secondary | ICD-10-CM | POA: Insufficient documentation

## 2011-07-07 DIAGNOSIS — Z8249 Family history of ischemic heart disease and other diseases of the circulatory system: Secondary | ICD-10-CM | POA: Insufficient documentation

## 2011-07-07 DIAGNOSIS — E7800 Pure hypercholesterolemia, unspecified: Secondary | ICD-10-CM | POA: Insufficient documentation

## 2011-07-07 DIAGNOSIS — I2589 Other forms of chronic ischemic heart disease: Secondary | ICD-10-CM | POA: Insufficient documentation

## 2011-07-07 DIAGNOSIS — K219 Gastro-esophageal reflux disease without esophagitis: Secondary | ICD-10-CM | POA: Insufficient documentation

## 2011-07-07 DIAGNOSIS — I2510 Atherosclerotic heart disease of native coronary artery without angina pectoris: Secondary | ICD-10-CM | POA: Insufficient documentation

## 2011-07-07 DIAGNOSIS — I509 Heart failure, unspecified: Secondary | ICD-10-CM | POA: Insufficient documentation

## 2011-07-07 DIAGNOSIS — Z5189 Encounter for other specified aftercare: Secondary | ICD-10-CM | POA: Insufficient documentation

## 2011-07-07 DIAGNOSIS — I2582 Chronic total occlusion of coronary artery: Secondary | ICD-10-CM | POA: Insufficient documentation

## 2011-07-07 DIAGNOSIS — I10 Essential (primary) hypertension: Secondary | ICD-10-CM | POA: Insufficient documentation

## 2011-07-07 DIAGNOSIS — I4891 Unspecified atrial fibrillation: Secondary | ICD-10-CM | POA: Insufficient documentation

## 2011-07-07 DIAGNOSIS — F17200 Nicotine dependence, unspecified, uncomplicated: Secondary | ICD-10-CM | POA: Insufficient documentation

## 2011-07-07 DIAGNOSIS — Z951 Presence of aortocoronary bypass graft: Secondary | ICD-10-CM | POA: Insufficient documentation

## 2011-07-07 DIAGNOSIS — E785 Hyperlipidemia, unspecified: Secondary | ICD-10-CM | POA: Insufficient documentation

## 2011-07-07 DIAGNOSIS — E669 Obesity, unspecified: Secondary | ICD-10-CM | POA: Insufficient documentation

## 2011-07-07 DIAGNOSIS — Z7902 Long term (current) use of antithrombotics/antiplatelets: Secondary | ICD-10-CM | POA: Insufficient documentation

## 2011-07-08 ENCOUNTER — Encounter (HOSPITAL_COMMUNITY): Payer: Self-pay

## 2011-07-09 ENCOUNTER — Encounter (HOSPITAL_COMMUNITY): Payer: Self-pay

## 2011-07-09 ENCOUNTER — Ambulatory Visit (INDEPENDENT_AMBULATORY_CARE_PROVIDER_SITE_OTHER): Payer: Medicare Other | Admitting: *Deleted

## 2011-07-09 DIAGNOSIS — I4891 Unspecified atrial fibrillation: Secondary | ICD-10-CM

## 2011-07-09 MED ORDER — WARFARIN SODIUM 2.5 MG PO TABS: 2.5000 mg | ORAL_TABLET | ORAL | Status: DC

## 2011-07-10 ENCOUNTER — Other Ambulatory Visit: Payer: Self-pay | Admitting: *Deleted

## 2011-07-10 MED ORDER — SCOPOLAMINE 1 MG/3DAYS TD PT72: 1.0000 | MEDICATED_PATCH | TRANSDERMAL | Status: DC

## 2011-07-10 NOTE — Telephone Encounter (Signed)
Pt informed via VM, informed to callback office with any questions/concerns.

## 2011-07-10 NOTE — Telephone Encounter (Signed)
erx done

## 2011-07-10 NOTE — Telephone Encounter (Signed)
Pt is requesting a rx for sea-sickness patches-please advise

## 2011-07-13 NOTE — Progress Notes (Signed)
Pacer remote checked remotely.

## 2011-07-14 ENCOUNTER — Encounter (HOSPITAL_COMMUNITY): Payer: Self-pay

## 2011-07-15 ENCOUNTER — Encounter (HOSPITAL_COMMUNITY): Payer: Self-pay

## 2011-07-15 ENCOUNTER — Encounter: Payer: Self-pay | Admitting: *Deleted

## 2011-07-16 ENCOUNTER — Encounter (HOSPITAL_COMMUNITY): Payer: Self-pay

## 2011-07-21 ENCOUNTER — Encounter (HOSPITAL_COMMUNITY): Payer: Self-pay

## 2011-07-22 ENCOUNTER — Encounter (HOSPITAL_COMMUNITY): Payer: Self-pay

## 2011-07-23 ENCOUNTER — Encounter (HOSPITAL_COMMUNITY): Payer: Self-pay

## 2011-07-28 ENCOUNTER — Encounter (HOSPITAL_COMMUNITY): Payer: Self-pay

## 2011-07-29 ENCOUNTER — Encounter (HOSPITAL_COMMUNITY): Payer: Self-pay

## 2011-07-30 ENCOUNTER — Encounter (HOSPITAL_COMMUNITY): Payer: Self-pay

## 2011-08-04 ENCOUNTER — Encounter (HOSPITAL_COMMUNITY): Payer: Self-pay | Attending: Cardiology

## 2011-08-04 DIAGNOSIS — E669 Obesity, unspecified: Secondary | ICD-10-CM | POA: Insufficient documentation

## 2011-08-04 DIAGNOSIS — I651 Occlusion and stenosis of basilar artery: Secondary | ICD-10-CM | POA: Insufficient documentation

## 2011-08-04 DIAGNOSIS — Z9861 Coronary angioplasty status: Secondary | ICD-10-CM | POA: Insufficient documentation

## 2011-08-04 DIAGNOSIS — I252 Old myocardial infarction: Secondary | ICD-10-CM | POA: Insufficient documentation

## 2011-08-04 DIAGNOSIS — I10 Essential (primary) hypertension: Secondary | ICD-10-CM | POA: Insufficient documentation

## 2011-08-04 DIAGNOSIS — Z8249 Family history of ischemic heart disease and other diseases of the circulatory system: Secondary | ICD-10-CM | POA: Insufficient documentation

## 2011-08-04 DIAGNOSIS — E7800 Pure hypercholesterolemia, unspecified: Secondary | ICD-10-CM | POA: Insufficient documentation

## 2011-08-04 DIAGNOSIS — I2589 Other forms of chronic ischemic heart disease: Secondary | ICD-10-CM | POA: Insufficient documentation

## 2011-08-04 DIAGNOSIS — F17200 Nicotine dependence, unspecified, uncomplicated: Secondary | ICD-10-CM | POA: Insufficient documentation

## 2011-08-04 DIAGNOSIS — Z7902 Long term (current) use of antithrombotics/antiplatelets: Secondary | ICD-10-CM | POA: Insufficient documentation

## 2011-08-04 DIAGNOSIS — I4892 Unspecified atrial flutter: Secondary | ICD-10-CM | POA: Insufficient documentation

## 2011-08-04 DIAGNOSIS — Z7982 Long term (current) use of aspirin: Secondary | ICD-10-CM | POA: Insufficient documentation

## 2011-08-04 DIAGNOSIS — I6529 Occlusion and stenosis of unspecified carotid artery: Secondary | ICD-10-CM | POA: Insufficient documentation

## 2011-08-04 DIAGNOSIS — I2582 Chronic total occlusion of coronary artery: Secondary | ICD-10-CM | POA: Insufficient documentation

## 2011-08-04 DIAGNOSIS — K219 Gastro-esophageal reflux disease without esophagitis: Secondary | ICD-10-CM | POA: Insufficient documentation

## 2011-08-04 DIAGNOSIS — I4891 Unspecified atrial fibrillation: Secondary | ICD-10-CM | POA: Insufficient documentation

## 2011-08-04 DIAGNOSIS — I2510 Atherosclerotic heart disease of native coronary artery without angina pectoris: Secondary | ICD-10-CM | POA: Insufficient documentation

## 2011-08-04 DIAGNOSIS — I509 Heart failure, unspecified: Secondary | ICD-10-CM | POA: Insufficient documentation

## 2011-08-04 DIAGNOSIS — E785 Hyperlipidemia, unspecified: Secondary | ICD-10-CM | POA: Insufficient documentation

## 2011-08-04 DIAGNOSIS — Z951 Presence of aortocoronary bypass graft: Secondary | ICD-10-CM | POA: Insufficient documentation

## 2011-08-04 DIAGNOSIS — Z5189 Encounter for other specified aftercare: Secondary | ICD-10-CM | POA: Insufficient documentation

## 2011-08-05 ENCOUNTER — Encounter (HOSPITAL_COMMUNITY): Payer: Self-pay

## 2011-08-06 ENCOUNTER — Ambulatory Visit (INDEPENDENT_AMBULATORY_CARE_PROVIDER_SITE_OTHER): Payer: Medicare Other | Admitting: *Deleted

## 2011-08-06 ENCOUNTER — Encounter (HOSPITAL_COMMUNITY): Payer: Self-pay

## 2011-08-06 DIAGNOSIS — I4891 Unspecified atrial fibrillation: Secondary | ICD-10-CM

## 2011-08-06 LAB — POCT INR: INR: 2.2

## 2011-08-07 ENCOUNTER — Other Ambulatory Visit (INDEPENDENT_AMBULATORY_CARE_PROVIDER_SITE_OTHER): Payer: Medicare Other | Admitting: *Deleted

## 2011-08-07 DIAGNOSIS — E785 Hyperlipidemia, unspecified: Secondary | ICD-10-CM

## 2011-08-07 DIAGNOSIS — Z79899 Other long term (current) drug therapy: Secondary | ICD-10-CM

## 2011-08-07 LAB — HEPATIC FUNCTION PANEL
ALT: 20 U/L (ref 0–35)
AST: 18 U/L (ref 0–37)
Alkaline Phosphatase: 100 U/L (ref 39–117)
Bilirubin, Direct: 0.1 mg/dL (ref 0.0–0.3)
Total Protein: 7.5 g/dL (ref 6.0–8.3)

## 2011-08-07 LAB — LIPID PANEL
Cholesterol: 184 mg/dL (ref 0–200)
VLDL: 19 mg/dL (ref 0.0–40.0)

## 2011-08-11 ENCOUNTER — Encounter (HOSPITAL_COMMUNITY): Payer: Self-pay

## 2011-08-12 ENCOUNTER — Encounter (HOSPITAL_COMMUNITY): Payer: Self-pay

## 2011-08-13 ENCOUNTER — Encounter (HOSPITAL_COMMUNITY): Payer: Self-pay

## 2011-08-18 ENCOUNTER — Encounter (HOSPITAL_COMMUNITY): Payer: Self-pay

## 2011-08-19 ENCOUNTER — Encounter (HOSPITAL_COMMUNITY): Payer: Self-pay

## 2011-08-20 ENCOUNTER — Encounter (HOSPITAL_COMMUNITY): Payer: Self-pay

## 2011-08-25 ENCOUNTER — Encounter (HOSPITAL_COMMUNITY): Payer: Self-pay

## 2011-08-26 ENCOUNTER — Encounter (HOSPITAL_COMMUNITY): Payer: Self-pay

## 2011-08-27 ENCOUNTER — Encounter (HOSPITAL_COMMUNITY): Payer: Self-pay

## 2011-09-01 ENCOUNTER — Encounter (HOSPITAL_COMMUNITY): Payer: Self-pay

## 2011-09-02 ENCOUNTER — Encounter (HOSPITAL_COMMUNITY): Payer: Self-pay

## 2011-09-03 ENCOUNTER — Ambulatory Visit (INDEPENDENT_AMBULATORY_CARE_PROVIDER_SITE_OTHER): Payer: Medicare Other | Admitting: *Deleted

## 2011-09-03 ENCOUNTER — Other Ambulatory Visit: Payer: Self-pay | Admitting: Cardiology

## 2011-09-03 ENCOUNTER — Encounter (HOSPITAL_COMMUNITY): Payer: Self-pay

## 2011-09-03 DIAGNOSIS — Z7902 Long term (current) use of antithrombotics/antiplatelets: Secondary | ICD-10-CM | POA: Insufficient documentation

## 2011-09-03 DIAGNOSIS — I252 Old myocardial infarction: Secondary | ICD-10-CM | POA: Insufficient documentation

## 2011-09-03 DIAGNOSIS — K219 Gastro-esophageal reflux disease without esophagitis: Secondary | ICD-10-CM | POA: Insufficient documentation

## 2011-09-03 DIAGNOSIS — E669 Obesity, unspecified: Secondary | ICD-10-CM | POA: Insufficient documentation

## 2011-09-03 DIAGNOSIS — I4892 Unspecified atrial flutter: Secondary | ICD-10-CM | POA: Insufficient documentation

## 2011-09-03 DIAGNOSIS — I651 Occlusion and stenosis of basilar artery: Secondary | ICD-10-CM | POA: Insufficient documentation

## 2011-09-03 DIAGNOSIS — I509 Heart failure, unspecified: Secondary | ICD-10-CM | POA: Insufficient documentation

## 2011-09-03 DIAGNOSIS — I2589 Other forms of chronic ischemic heart disease: Secondary | ICD-10-CM | POA: Insufficient documentation

## 2011-09-03 DIAGNOSIS — E7800 Pure hypercholesterolemia, unspecified: Secondary | ICD-10-CM | POA: Insufficient documentation

## 2011-09-03 DIAGNOSIS — Z5189 Encounter for other specified aftercare: Secondary | ICD-10-CM | POA: Insufficient documentation

## 2011-09-03 DIAGNOSIS — Z7982 Long term (current) use of aspirin: Secondary | ICD-10-CM | POA: Insufficient documentation

## 2011-09-03 DIAGNOSIS — I2510 Atherosclerotic heart disease of native coronary artery without angina pectoris: Secondary | ICD-10-CM | POA: Insufficient documentation

## 2011-09-03 DIAGNOSIS — I10 Essential (primary) hypertension: Secondary | ICD-10-CM | POA: Insufficient documentation

## 2011-09-03 DIAGNOSIS — I6529 Occlusion and stenosis of unspecified carotid artery: Secondary | ICD-10-CM | POA: Insufficient documentation

## 2011-09-03 DIAGNOSIS — I4891 Unspecified atrial fibrillation: Secondary | ICD-10-CM

## 2011-09-03 DIAGNOSIS — Z951 Presence of aortocoronary bypass graft: Secondary | ICD-10-CM | POA: Insufficient documentation

## 2011-09-03 DIAGNOSIS — E785 Hyperlipidemia, unspecified: Secondary | ICD-10-CM | POA: Insufficient documentation

## 2011-09-03 DIAGNOSIS — F17200 Nicotine dependence, unspecified, uncomplicated: Secondary | ICD-10-CM | POA: Insufficient documentation

## 2011-09-03 DIAGNOSIS — I2582 Chronic total occlusion of coronary artery: Secondary | ICD-10-CM | POA: Insufficient documentation

## 2011-09-03 DIAGNOSIS — Z8249 Family history of ischemic heart disease and other diseases of the circulatory system: Secondary | ICD-10-CM | POA: Insufficient documentation

## 2011-09-03 DIAGNOSIS — Z9861 Coronary angioplasty status: Secondary | ICD-10-CM | POA: Insufficient documentation

## 2011-09-08 ENCOUNTER — Encounter (HOSPITAL_COMMUNITY): Payer: Self-pay

## 2011-09-08 ENCOUNTER — Encounter: Payer: Self-pay | Admitting: Cardiology

## 2011-09-08 ENCOUNTER — Ambulatory Visit (INDEPENDENT_AMBULATORY_CARE_PROVIDER_SITE_OTHER): Payer: Medicare Other | Admitting: Cardiology

## 2011-09-08 DIAGNOSIS — I4891 Unspecified atrial fibrillation: Secondary | ICD-10-CM

## 2011-09-08 DIAGNOSIS — I10 Essential (primary) hypertension: Secondary | ICD-10-CM

## 2011-09-08 DIAGNOSIS — E7800 Pure hypercholesterolemia, unspecified: Secondary | ICD-10-CM

## 2011-09-08 DIAGNOSIS — I2510 Atherosclerotic heart disease of native coronary artery without angina pectoris: Secondary | ICD-10-CM

## 2011-09-08 NOTE — Assessment & Plan Note (Signed)
Borderline at the present time.

## 2011-09-08 NOTE — Patient Instructions (Signed)
Your physician recommends that you schedule a follow-up appointment in: 3 months with Dr. Stuckey 

## 2011-09-08 NOTE — Assessment & Plan Note (Signed)
No recurrent problems.  No chest pain.

## 2011-09-08 NOTE — Assessment & Plan Note (Signed)
Still on warfarin at the present time.  No problems so far.  We spoke at length about being careful.

## 2011-09-08 NOTE — Progress Notes (Signed)
HPI:  She went on the cruise.  Everything went well.  Even off of statins, her joints ache a lot.  We did talk about medication options today.  Not having any excessive bleeding.  No chest pain. She developed a little bit of swelling in both legs.     Current Outpatient Prescriptions  Medication Sig Dispense Refill  . acetaminophen (TYLENOL) 500 MG tablet Take 500 mg by mouth as needed.        Marland Kitchen amLODipine (NORVASC) 10 MG tablet TAKE 1 TABLET DAILY  90 tablet  2  . aspirin 81 MG tablet Take 81 mg by mouth daily.        . fluticasone (FLONASE) 50 MCG/ACT nasal spray Place 1 spray into the nose as needed.        Marland Kitchen losartan (COZAAR) 50 MG tablet Take 1 tablet (50 mg total) by mouth daily.  30 tablet  1  . metoprolol (TOPROL-XL) 100 MG 24 hr tablet Take 1 tablet (100 mg total) by mouth daily.  90 tablet  3  . oxymetazoline (AFRIN) 0.05 % nasal spray Place 2 sprays into the nose as needed.        . pantoprazole (PROTONIX) 40 MG tablet Take 1 tablet (40 mg total) by mouth daily.  90 tablet  3  . warfarin (COUMADIN) 2.5 MG tablet Take 1 tablet (2.5 mg total) by mouth as directed.  135 tablet  1    No Known Allergies  Past Medical History  Diagnosis Date  . OBESITY   . CAD, ARTERY BYPASS GRAFT 11/2009  . LUNG NODULE     stable RLL nodule CT 04/2010 and 10/2010 and 03/2011  . 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 PAF (paroxysmal atrial fibrillation)     chronic anticoag  . MYOCARDIAL INFARCTION, ACUTE, ANTERIOR WALL 11/2009  . Hypertension   . HYPERCHOLESTEROLEMIA  IIA   . Depression   . ARTHRITIS, CERVICAL SPINE   . Thyroid nodule 2011    neg bx 01/2010    Past Surgical History  Procedure Date  . Coronary artery bypass graft 11-2009  . Cholecystectomy   . Appendectomy   . Breast lumpectomy 1967    right breast (bening)  . Tonsillectomy   . Angioplasty     ans stenting of her LAD    Family History  Problem Relation Age of Onset  . Other      Significant for premature  cardiovascular disease affecting her father in his 12's  . Alcohol abuse      family hx addiction (parent)  . Arthritis      parent  . Hypertension      parent and other relative  . Cervical cancer Mother   . Hypothyroidism Daughter     History   Social History  . Marital Status: Married    Spouse Name: N/A    Number of Children: N/A  . 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 Smoking status: Former Smoker    Quit date: 11/02/2009  . Smokeless tobacco: Not on file  . Alcohol Use: No  . Drug Use: No  . Sexually Active: Not on file   Other Topics Concern  . Not on file   Social History Narrative  . No narrative on file    ROS: Please see the HPI.  All other systems reviewed  and negative.  PHYSICAL EXAM:  BP 146/88  Pulse 63  Ht 5\' 4"  (1.626 m)  Wt 230 lb (104.327 kg)  BMI 39.48 kg/m2  General: Well developed, well nourished, in no acute distress. Head:  Normocephalic and atraumatic. Neck: no JVD Lungs: Clear to auscultation and percussion. Heart: Normal S1 and S2.  No murmur, rubs or gallops.  Abdomen:  Normal bowel sounds; soft; non tender; no organomegaly Pulses: Pulses normal in all 4 extremities. Extremities: No clubbing or cyanosis. No edema. Neurologic: Alert and oriented x 3.  EKG:  NSR.  Low voltage QRS.  Cannot rule of anterior MI, old.   ASSESSMENT AND PLAN:

## 2011-09-08 NOTE — Assessment & Plan Note (Signed)
No longer on statins.  Cannot take.

## 2011-09-09 ENCOUNTER — Encounter (HOSPITAL_COMMUNITY): Payer: Self-pay

## 2011-09-10 ENCOUNTER — Encounter (HOSPITAL_COMMUNITY): Payer: Self-pay

## 2011-09-15 ENCOUNTER — Encounter (HOSPITAL_COMMUNITY): Payer: Self-pay

## 2011-09-16 ENCOUNTER — Other Ambulatory Visit (HOSPITAL_COMMUNITY): Payer: Self-pay | Admitting: *Deleted

## 2011-09-16 ENCOUNTER — Encounter (HOSPITAL_COMMUNITY): Payer: Self-pay

## 2011-09-17 ENCOUNTER — Encounter (HOSPITAL_COMMUNITY): Payer: Self-pay

## 2011-09-22 ENCOUNTER — Encounter (HOSPITAL_COMMUNITY)
Admission: RE | Admit: 2011-09-22 | Discharge: 2011-09-22 | Disposition: A | Discharge status: Home / Self Care | Payer: Self-pay | Source: Ambulatory Visit | Attending: Cardiology | Admitting: Cardiology

## 2011-09-23 ENCOUNTER — Encounter (HOSPITAL_COMMUNITY): Payer: Self-pay

## 2011-09-24 ENCOUNTER — Encounter (HOSPITAL_COMMUNITY): Payer: Self-pay

## 2011-09-29 ENCOUNTER — Encounter (HOSPITAL_COMMUNITY)
Admission: RE | Admit: 2011-09-29 | Discharge: 2011-09-29 | Disposition: A | Discharge status: Home / Self Care | Payer: Self-pay | Source: Ambulatory Visit | Attending: Cardiology | Admitting: Cardiology

## 2011-09-30 ENCOUNTER — Encounter (HOSPITAL_COMMUNITY)
Admission: RE | Admit: 2011-09-30 | Discharge: 2011-09-30 | Disposition: A | Discharge status: Home / Self Care | Payer: Self-pay | Source: Ambulatory Visit | Attending: Cardiology | Admitting: Cardiology

## 2011-10-01 ENCOUNTER — Encounter: Payer: Medicare Other | Admitting: *Deleted

## 2011-10-01 ENCOUNTER — Ambulatory Visit (INDEPENDENT_AMBULATORY_CARE_PROVIDER_SITE_OTHER): Payer: Medicare Other | Admitting: *Deleted

## 2011-10-01 ENCOUNTER — Encounter (HOSPITAL_COMMUNITY)
Admission: RE | Admit: 2011-10-01 | Discharge: 2011-10-01 | Disposition: A | Discharge status: Home / Self Care | Payer: Self-pay | Source: Ambulatory Visit | Attending: Cardiology | Admitting: Cardiology

## 2011-10-01 DIAGNOSIS — I4891 Unspecified atrial fibrillation: Secondary | ICD-10-CM

## 2011-10-01 LAB — POCT INR: INR: 2

## 2011-10-06 ENCOUNTER — Encounter (HOSPITAL_COMMUNITY): Admission: RE | Admit: 2011-10-06 | Payer: Self-pay | Source: Ambulatory Visit

## 2011-10-06 ENCOUNTER — Other Ambulatory Visit (INDEPENDENT_AMBULATORY_CARE_PROVIDER_SITE_OTHER): Payer: Medicare Other

## 2011-10-06 ENCOUNTER — Encounter: Payer: Self-pay | Admitting: Endocrinology

## 2011-10-06 ENCOUNTER — Ambulatory Visit (INDEPENDENT_AMBULATORY_CARE_PROVIDER_SITE_OTHER): Payer: Medicare Other | Admitting: Endocrinology

## 2011-10-06 ENCOUNTER — Ambulatory Visit: Payer: Medicare Other | Admitting: Endocrinology

## 2011-10-06 ENCOUNTER — Encounter: Payer: Self-pay | Admitting: *Deleted

## 2011-10-06 DIAGNOSIS — Z8249 Family history of ischemic heart disease and other diseases of the circulatory system: Secondary | ICD-10-CM | POA: Insufficient documentation

## 2011-10-06 DIAGNOSIS — I2589 Other forms of chronic ischemic heart disease: Secondary | ICD-10-CM | POA: Insufficient documentation

## 2011-10-06 DIAGNOSIS — E042 Nontoxic multinodular goiter: Secondary | ICD-10-CM

## 2011-10-06 DIAGNOSIS — I10 Essential (primary) hypertension: Secondary | ICD-10-CM | POA: Insufficient documentation

## 2011-10-06 DIAGNOSIS — M791 Myalgia, unspecified site: Secondary | ICD-10-CM

## 2011-10-06 DIAGNOSIS — IMO0001 Reserved for inherently not codable concepts without codable children: Secondary | ICD-10-CM

## 2011-10-06 DIAGNOSIS — Z951 Presence of aortocoronary bypass graft: Secondary | ICD-10-CM | POA: Insufficient documentation

## 2011-10-06 DIAGNOSIS — K219 Gastro-esophageal reflux disease without esophagitis: Secondary | ICD-10-CM | POA: Insufficient documentation

## 2011-10-06 DIAGNOSIS — I2582 Chronic total occlusion of coronary artery: Secondary | ICD-10-CM | POA: Insufficient documentation

## 2011-10-06 DIAGNOSIS — I509 Heart failure, unspecified: Secondary | ICD-10-CM | POA: Insufficient documentation

## 2011-10-06 DIAGNOSIS — Z9861 Coronary angioplasty status: Secondary | ICD-10-CM | POA: Insufficient documentation

## 2011-10-06 DIAGNOSIS — I651 Occlusion and stenosis of basilar artery: Secondary | ICD-10-CM | POA: Insufficient documentation

## 2011-10-06 DIAGNOSIS — I2510 Atherosclerotic heart disease of native coronary artery without angina pectoris: Secondary | ICD-10-CM | POA: Insufficient documentation

## 2011-10-06 DIAGNOSIS — I6529 Occlusion and stenosis of unspecified carotid artery: Secondary | ICD-10-CM | POA: Insufficient documentation

## 2011-10-06 DIAGNOSIS — I4891 Unspecified atrial fibrillation: Secondary | ICD-10-CM | POA: Insufficient documentation

## 2011-10-06 DIAGNOSIS — I252 Old myocardial infarction: Secondary | ICD-10-CM | POA: Insufficient documentation

## 2011-10-06 DIAGNOSIS — Z5189 Encounter for other specified aftercare: Secondary | ICD-10-CM | POA: Insufficient documentation

## 2011-10-06 DIAGNOSIS — E669 Obesity, unspecified: Secondary | ICD-10-CM | POA: Insufficient documentation

## 2011-10-06 DIAGNOSIS — E7800 Pure hypercholesterolemia, unspecified: Secondary | ICD-10-CM | POA: Insufficient documentation

## 2011-10-06 DIAGNOSIS — I4892 Unspecified atrial flutter: Secondary | ICD-10-CM | POA: Insufficient documentation

## 2011-10-06 DIAGNOSIS — Z7982 Long term (current) use of aspirin: Secondary | ICD-10-CM | POA: Insufficient documentation

## 2011-10-06 DIAGNOSIS — F17200 Nicotine dependence, unspecified, uncomplicated: Secondary | ICD-10-CM | POA: Insufficient documentation

## 2011-10-06 DIAGNOSIS — E785 Hyperlipidemia, unspecified: Secondary | ICD-10-CM | POA: Insufficient documentation

## 2011-10-06 DIAGNOSIS — Z7902 Long term (current) use of antithrombotics/antiplatelets: Secondary | ICD-10-CM | POA: Insufficient documentation

## 2011-10-06 LAB — TSH: TSH: 2.09 u[IU]/mL (ref 0.35–5.50)

## 2011-10-06 MED ORDER — TRAMADOL-ACETAMINOPHEN 37.5-325 MG PO TABS: 1.0000 | ORAL_TABLET | Freq: Four times a day (QID) | ORAL | Status: AC | PRN

## 2011-10-06 NOTE — Patient Instructions (Addendum)
blood tests are being requested for you today.  please call [phone] to hear your test results.  You will be prompted to enter the 9-digit "MRN" number that appears at the top left of this page, followed by #.  Then you will hear the message. Let's recheck the thyroid ultrasound.  you will receive a phone call, about a day and time for an appointment i have sent a prescription to your pharmacy, for a new pain medication. (update: i left message on phone-tree:  rx as we discussed)

## 2011-10-06 NOTE — Progress Notes (Signed)
Subjective:    Patient ID: EWA HIPP, female    DOB: 1941/06/16, 70 y.o.   MRN: [id number]  HPI Pt states 2 days of moderate pain at the thighs, left buttock, and arms.  No assoc numbness. pt had low-risk bx result early in 2011, for multinodular goiter. Past Medical History  Diagnosis Date  . OBESITY   . CAD, ARTERY BYPASS GRAFT 11/2009  . LUNG NODULE     stable RLL nodule CT 04/2010 and 10/2010 and 03/2011  . 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 PAF (paroxysmal atrial fibrillation)     chronic anticoag  . MYOCARDIAL INFARCTION, ACUTE, ANTERIOR WALL 11/2009  . Hypertension   . HYPERCHOLESTEROLEMIA  IIA   . Depression   . ARTHRITIS, CERVICAL SPINE   . Thyroid nodule 2011    neg bx 01/2010    Past Surgical History  Procedure Date  . Coronary artery bypass graft 11-2009  . Cholecystectomy   . Appendectomy   . Breast lumpectomy 1967    right breast (bening)  . Tonsillectomy   . Angioplasty     ans stenting of her LAD    History   Social History  . Marital Status: Married    Spouse Name: N/A    Number of Children: N/A  . 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 Smoking status: Former Smoker    Quit date: 11/02/2009  . Smokeless tobacco: Not on file  . Alcohol Use: No  . Drug Use: No  . Sexually Active: Not on file   Other Topics Concern  . Not on file   Social History Narrative  . No narrative on file    Current Outpatient Prescriptions on File Prior to Visit  Medication Sig Dispense Refill  . acetaminophen (TYLENOL) 500 MG tablet Take 500 mg by mouth as needed.        Marland Kitchen amLODipine (NORVASC) 10 MG tablet TAKE 1 TABLET DAILY  90 tablet  2  . aspirin 81 MG tablet Take 81 mg by mouth daily.        . fluticasone (FLONASE) 50 MCG/ACT nasal spray Place 1 spray into the nose as needed.        Marland Kitchen losartan (COZAAR) 50 MG tablet Take 1 tablet (50 mg total) by mouth daily.   30 tablet  1  . metoprolol (TOPROL-XL) 100 MG 24 hr tablet Take 1 tablet (100 mg total) by mouth daily.  90 tablet  3  . oxymetazoline (AFRIN) 0.05 % nasal spray Place 2 sprays into the nose as needed.        . pantoprazole (PROTONIX) 40 MG tablet Take 1 tablet (40 mg total) by mouth daily.  90 tablet  3  . warfarin (COUMADIN) 2.5 MG tablet Take 1 tablet (2.5 mg total) by mouth as directed.  135 tablet  1    No Known Allergies  Family History  Problem Relation Age of Onset  . Other      Significant for premature cardiovascular disease affecting her father in his 27's  . Alcohol abuse      family hx addiction (parent)  . Arthritis      parent  . Hypertension      parent and other relative  . Cervical cancer Mother   . Hypothyroidism Daughter     BP 128/82  Pulse 69  Temp(Src) 97.9 F (36.6 C) (Oral)  Ht 5\' 4"  (1.626 m)  Wt 230 lb (104.327 kg)  BMI 39.48 kg/m2  SpO2 96%     Review of Systems Denies rash.  She says she notices the thyroid enlargement.    Objective:   Physical Exam VITAL SIGNS:  See vs page GENERAL: no distress Neck; thyroid is 2x normal size, with multinodular surface. MUSCULOSKELETAL: muscle bulk and strength are grossly normal.  no obvious joint swelling.  gait is normal and steady.    (i reviewed lab results)    Assessment & Plan:  Myalgias, new, uncertain etiology Multinodular goiter, which is usually hereditary

## 2011-10-07 ENCOUNTER — Encounter (HOSPITAL_COMMUNITY): Payer: Self-pay

## 2011-10-08 ENCOUNTER — Encounter (HOSPITAL_COMMUNITY): Payer: Self-pay

## 2011-10-09 ENCOUNTER — Ambulatory Visit
Admission: RE | Admit: 2011-10-09 | Discharge: 2011-10-09 | Disposition: A | Discharge status: Home / Self Care | Payer: Medicare Other | Source: Ambulatory Visit | Attending: Endocrinology | Admitting: Endocrinology

## 2011-10-09 DIAGNOSIS — E042 Nontoxic multinodular goiter: Secondary | ICD-10-CM

## 2011-10-13 ENCOUNTER — Encounter (HOSPITAL_COMMUNITY): Payer: Self-pay

## 2011-10-14 ENCOUNTER — Ambulatory Visit (INDEPENDENT_AMBULATORY_CARE_PROVIDER_SITE_OTHER): Payer: Medicare Other | Admitting: *Deleted

## 2011-10-14 ENCOUNTER — Encounter: Payer: Self-pay | Admitting: Internal Medicine

## 2011-10-14 ENCOUNTER — Encounter (HOSPITAL_COMMUNITY): Payer: Self-pay

## 2011-10-14 DIAGNOSIS — I4891 Unspecified atrial fibrillation: Secondary | ICD-10-CM

## 2011-10-14 DIAGNOSIS — Z950 Presence of cardiac pacemaker: Secondary | ICD-10-CM

## 2011-10-15 ENCOUNTER — Encounter (HOSPITAL_COMMUNITY): Payer: Self-pay

## 2011-10-16 LAB — PACEMAKER DEVICE OBSERVATION
ATRIAL PACING PM: 11
BAMS-0001: 150 {beats}/min
DEVICE MODEL PM: 7107049
RV LEAD IMPEDENCE PM: 375 Ohm
RV LEAD THRESHOLD: 1.125 V
VENTRICULAR PACING PM: 1

## 2011-10-16 NOTE — Progress Notes (Signed)
Patient presents for device clinic pacemaker check.  No problems with shortness of breath, chest pain, palpitations, or syncope.  Device interrogated and found to be functioning normally.  No changes made today.  See [employer] report for full details. She had been unable to use [employer] because of lack of digital filter.  One was given to her today.  We will attempt Merlin transmission again in 3 months.   Gypsy Balsam, RN, BSN 10/16/2011 2:05 PM

## 2011-10-20 ENCOUNTER — Encounter (HOSPITAL_COMMUNITY)
Admission: RE | Admit: 2011-10-20 | Discharge: 2011-10-20 | Disposition: A | Discharge status: Home / Self Care | Payer: Self-pay | Source: Ambulatory Visit | Attending: Cardiology | Admitting: Cardiology

## 2011-10-21 ENCOUNTER — Encounter (HOSPITAL_COMMUNITY)
Admission: RE | Admit: 2011-10-21 | Discharge: 2011-10-21 | Disposition: A | Discharge status: Home / Self Care | Payer: Self-pay | Source: Ambulatory Visit | Attending: Cardiology | Admitting: Cardiology

## 2011-10-22 ENCOUNTER — Encounter (HOSPITAL_COMMUNITY)
Admission: RE | Admit: 2011-10-22 | Discharge: 2011-10-22 | Disposition: A | Discharge status: Home / Self Care | Payer: Self-pay | Source: Ambulatory Visit | Attending: Cardiology | Admitting: Cardiology

## 2011-10-23 ENCOUNTER — Ambulatory Visit (INDEPENDENT_AMBULATORY_CARE_PROVIDER_SITE_OTHER): Payer: Medicare Other | Admitting: *Deleted

## 2011-10-23 DIAGNOSIS — I4891 Unspecified atrial fibrillation: Secondary | ICD-10-CM

## 2011-10-23 LAB — POCT INR: INR: 2.6

## 2011-10-27 ENCOUNTER — Encounter (HOSPITAL_COMMUNITY): Payer: Self-pay

## 2011-10-28 ENCOUNTER — Encounter (HOSPITAL_COMMUNITY): Payer: Self-pay

## 2011-10-29 ENCOUNTER — Encounter (HOSPITAL_COMMUNITY): Payer: Self-pay

## 2011-11-03 ENCOUNTER — Encounter (HOSPITAL_COMMUNITY): Payer: Self-pay

## 2011-11-04 ENCOUNTER — Encounter (HOSPITAL_COMMUNITY): Payer: Self-pay

## 2011-11-05 ENCOUNTER — Encounter (HOSPITAL_COMMUNITY): Payer: Self-pay

## 2011-11-05 DIAGNOSIS — I4892 Unspecified atrial flutter: Secondary | ICD-10-CM | POA: Insufficient documentation

## 2011-11-05 DIAGNOSIS — Z7982 Long term (current) use of aspirin: Secondary | ICD-10-CM | POA: Insufficient documentation

## 2011-11-05 DIAGNOSIS — I10 Essential (primary) hypertension: Secondary | ICD-10-CM | POA: Insufficient documentation

## 2011-11-05 DIAGNOSIS — E669 Obesity, unspecified: Secondary | ICD-10-CM | POA: Insufficient documentation

## 2011-11-05 DIAGNOSIS — E7800 Pure hypercholesterolemia, unspecified: Secondary | ICD-10-CM | POA: Insufficient documentation

## 2011-11-05 DIAGNOSIS — I252 Old myocardial infarction: Secondary | ICD-10-CM | POA: Insufficient documentation

## 2011-11-05 DIAGNOSIS — I509 Heart failure, unspecified: Secondary | ICD-10-CM | POA: Insufficient documentation

## 2011-11-05 DIAGNOSIS — I2510 Atherosclerotic heart disease of native coronary artery without angina pectoris: Secondary | ICD-10-CM | POA: Insufficient documentation

## 2011-11-05 DIAGNOSIS — Z9861 Coronary angioplasty status: Secondary | ICD-10-CM | POA: Insufficient documentation

## 2011-11-05 DIAGNOSIS — F17200 Nicotine dependence, unspecified, uncomplicated: Secondary | ICD-10-CM | POA: Insufficient documentation

## 2011-11-05 DIAGNOSIS — I6529 Occlusion and stenosis of unspecified carotid artery: Secondary | ICD-10-CM | POA: Insufficient documentation

## 2011-11-05 DIAGNOSIS — I2582 Chronic total occlusion of coronary artery: Secondary | ICD-10-CM | POA: Insufficient documentation

## 2011-11-05 DIAGNOSIS — Z8249 Family history of ischemic heart disease and other diseases of the circulatory system: Secondary | ICD-10-CM | POA: Insufficient documentation

## 2011-11-05 DIAGNOSIS — I4891 Unspecified atrial fibrillation: Secondary | ICD-10-CM | POA: Insufficient documentation

## 2011-11-05 DIAGNOSIS — Z7902 Long term (current) use of antithrombotics/antiplatelets: Secondary | ICD-10-CM | POA: Insufficient documentation

## 2011-11-05 DIAGNOSIS — Z5189 Encounter for other specified aftercare: Secondary | ICD-10-CM | POA: Insufficient documentation

## 2011-11-05 DIAGNOSIS — I2589 Other forms of chronic ischemic heart disease: Secondary | ICD-10-CM | POA: Insufficient documentation

## 2011-11-05 DIAGNOSIS — K219 Gastro-esophageal reflux disease without esophagitis: Secondary | ICD-10-CM | POA: Insufficient documentation

## 2011-11-05 DIAGNOSIS — I651 Occlusion and stenosis of basilar artery: Secondary | ICD-10-CM | POA: Insufficient documentation

## 2011-11-05 DIAGNOSIS — Z951 Presence of aortocoronary bypass graft: Secondary | ICD-10-CM | POA: Insufficient documentation

## 2011-11-05 DIAGNOSIS — E785 Hyperlipidemia, unspecified: Secondary | ICD-10-CM | POA: Insufficient documentation

## 2011-11-10 ENCOUNTER — Encounter (HOSPITAL_COMMUNITY)
Admission: RE | Admit: 2011-11-10 | Discharge: 2011-11-10 | Disposition: A | Discharge status: Home / Self Care | Payer: Self-pay | Source: Ambulatory Visit | Attending: Cardiology | Admitting: Cardiology

## 2011-11-11 ENCOUNTER — Encounter (HOSPITAL_COMMUNITY)
Admission: RE | Admit: 2011-11-11 | Discharge: 2011-11-11 | Disposition: A | Discharge status: Home / Self Care | Payer: Self-pay | Source: Ambulatory Visit | Attending: Cardiology | Admitting: Cardiology

## 2011-11-12 ENCOUNTER — Encounter (HOSPITAL_COMMUNITY)
Admission: RE | Admit: 2011-11-12 | Discharge: 2011-11-12 | Disposition: A | Discharge status: Home / Self Care | Payer: Self-pay | Source: Ambulatory Visit | Attending: Cardiology | Admitting: Cardiology

## 2011-11-17 ENCOUNTER — Telehealth: Payer: Self-pay | Admitting: Cardiology

## 2011-11-17 ENCOUNTER — Encounter (HOSPITAL_COMMUNITY)
Admission: RE | Admit: 2011-11-17 | Discharge: 2011-11-17 | Disposition: A | Discharge status: Home / Self Care | Payer: Self-pay | Source: Ambulatory Visit | Attending: Cardiology | Admitting: Cardiology

## 2011-11-17 DIAGNOSIS — K219 Gastro-esophageal reflux disease without esophagitis: Secondary | ICD-10-CM

## 2011-11-17 DIAGNOSIS — I2510 Atherosclerotic heart disease of native coronary artery without angina pectoris: Secondary | ICD-10-CM

## 2011-11-17 DIAGNOSIS — I10 Essential (primary) hypertension: Secondary | ICD-10-CM

## 2011-11-17 DIAGNOSIS — I4891 Unspecified atrial fibrillation: Secondary | ICD-10-CM

## 2011-11-17 MED ORDER — METOPROLOL SUCCINATE ER 100 MG PO TB24: 100.0000 mg | ORAL_TABLET | Freq: Every day | ORAL | Status: DC

## 2011-11-17 MED ORDER — LOSARTAN POTASSIUM 50 MG PO TABS: 50.0000 mg | ORAL_TABLET | Freq: Every day | ORAL | Status: DC

## 2011-11-17 MED ORDER — PANTOPRAZOLE SODIUM 40 MG PO TBEC: 40.0000 mg | DELAYED_RELEASE_TABLET | Freq: Every day | ORAL | Status: DC

## 2011-11-17 MED ORDER — AMLODIPINE BESYLATE 10 MG PO TABS: 10.0000 mg | ORAL_TABLET | Freq: Every day | ORAL | Status: DC

## 2011-11-17 NOTE — Telephone Encounter (Signed)
Fu Patient returning your call

## 2011-11-17 NOTE — Telephone Encounter (Signed)
11/17/11--pt calling stating needs new written scripts for all meds we prescribe--advised pt dr Riley Kill not in office until 11/24/11-i will forward to lauren so she can get scripts ready for dr Riley Kill to sign when he is back in office--pt states this is fine and she will pic up 1/22 after her c rehab--nt

## 2011-11-17 NOTE — Telephone Encounter (Signed)
New Msg: Pt calling needing new RX for all her heart meds except coumadin. Pt has a new pharmacy plan and needs paper RX's. Pt said she will be in the office on Thursday and wanted to see if we could have them ready for her by then. Please return pt call to discuss further if necessary.

## 2011-11-17 NOTE — Telephone Encounter (Signed)
Left message to call back  

## 2011-11-17 NOTE — Telephone Encounter (Signed)
REQUESTED SCRIPTS  WRITTEN FOR 90 DAYS AND LEFT AT FRONT DESK FOR PICK UP./ CY

## 2011-11-18 ENCOUNTER — Encounter (HOSPITAL_COMMUNITY): Payer: Self-pay

## 2011-11-19 ENCOUNTER — Ambulatory Visit (INDEPENDENT_AMBULATORY_CARE_PROVIDER_SITE_OTHER): Payer: Medicare Other | Admitting: *Deleted

## 2011-11-19 ENCOUNTER — Encounter (HOSPITAL_COMMUNITY)
Admission: RE | Admit: 2011-11-19 | Discharge: 2011-11-19 | Disposition: A | Discharge status: Home / Self Care | Payer: Self-pay | Source: Ambulatory Visit | Attending: Cardiology | Admitting: Cardiology

## 2011-11-19 DIAGNOSIS — I4891 Unspecified atrial fibrillation: Secondary | ICD-10-CM

## 2011-11-19 LAB — POCT INR: INR: 1.9

## 2011-11-24 ENCOUNTER — Encounter (HOSPITAL_COMMUNITY)
Admission: RE | Admit: 2011-11-24 | Discharge: 2011-11-24 | Disposition: A | Discharge status: Home / Self Care | Payer: Self-pay | Source: Ambulatory Visit | Attending: Cardiology | Admitting: Cardiology

## 2011-11-25 ENCOUNTER — Encounter (HOSPITAL_COMMUNITY)
Admission: RE | Admit: 2011-11-25 | Discharge: 2011-11-25 | Disposition: A | Discharge status: Home / Self Care | Payer: Self-pay | Source: Ambulatory Visit | Attending: Cardiology | Admitting: Cardiology

## 2011-11-26 ENCOUNTER — Encounter (HOSPITAL_COMMUNITY)
Admission: RE | Admit: 2011-11-26 | Discharge: 2011-11-26 | Disposition: A | Discharge status: Home / Self Care | Payer: Self-pay | Source: Ambulatory Visit | Attending: Cardiology | Admitting: Cardiology

## 2011-11-30 ENCOUNTER — Ambulatory Visit (INDEPENDENT_AMBULATORY_CARE_PROVIDER_SITE_OTHER): Payer: Medicare Other | Admitting: Internal Medicine

## 2011-11-30 ENCOUNTER — Ambulatory Visit (INDEPENDENT_AMBULATORY_CARE_PROVIDER_SITE_OTHER)
Admission: RE | Admit: 2011-11-30 | Discharge: 2011-11-30 | Disposition: A | Discharge status: Home / Self Care | Payer: Medicare Other | Source: Ambulatory Visit | Attending: Internal Medicine | Admitting: Internal Medicine

## 2011-11-30 ENCOUNTER — Encounter: Payer: Self-pay | Admitting: Internal Medicine

## 2011-11-30 VITALS — BP 122/80 | HR 64 | Temp 98.1°F | Ht 64.0 in | Wt 230.0 lb

## 2011-11-30 DIAGNOSIS — IMO0002 Reserved for concepts with insufficient information to code with codable children: Secondary | ICD-10-CM

## 2011-11-30 DIAGNOSIS — M5410 Radiculopathy, site unspecified: Secondary | ICD-10-CM

## 2011-11-30 MED ORDER — METHOCARBAMOL 500 MG PO TABS: 500.0000 mg | ORAL_TABLET | Freq: Three times a day (TID) | ORAL | Status: AC | PRN

## 2011-11-30 MED ORDER — PREDNISONE (PAK) 10 MG PO TABS: 10.0000 mg | ORAL_TABLET | ORAL | Status: AC

## 2011-11-30 NOTE — Patient Instructions (Signed)
It was good to see you today. Test(s) ordered today. Your results will be called to you after review (48-72hours after test completion). If any changes need to be made, you will be notified at that time. Prednisone taper x12 days and Robaxin for muscle relaxer as discussed for pain relief - Your prescription(s) have been given to you to submit to your pharmacy. Please take as directed and contact our office if you believe you are having problem(s) with the medication(s). we'll make referral to medical therapy for your hip, leg and back pain. Our office will contact you regarding appointment(s) once made.

## 2011-11-30 NOTE — Progress Notes (Signed)
  Subjective:    Patient ID: Erica Hood, female    DOB: 1941/10/08, 71 y.o.   MRN: [id number]  Leg Pain   Hip Pain   L hip pain laterally and buttock -also RLE pain down posterior thigh and knee -  associated with weakness and "buckling" sensation -  No joint swelling No injury or falls +chronic low back pain and "stiff"/"tight symptoms worse with activity, better with rest Min improved with Tylenol  Past Medical History  Diagnosis Date  . OBESITY   . CAD, ARTERY BYPASS GRAFT 11/2009  . LUNG NODULE     stable RLL nodule CT 04/2010 and 10/2010 and 03/2011  . 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 PAF (paroxysmal atrial fibrillation)     chronic anticoag  . MYOCARDIAL INFARCTION, ACUTE, ANTERIOR WALL 11/2009  . Hypertension   . HYPERCHOLESTEROLEMIA  IIA   . Depression   . ARTHRITIS, CERVICAL SPINE   . Thyroid nodule 2011    neg bx 01/2010     Review of Systems  Constitutional: Negative for fever.  Respiratory: Negative for cough and shortness of breath.   Cardiovascular: Negative for chest pain and leg swelling.       Objective:   Physical Exam  BP 122/80  Pulse 64  Temp(Src) 98.1 F (36.7 C) (Oral)  Ht 5\' 4"  (1.626 m)  Wt 230 lb (104.327 kg)  BMI 39.48 kg/m2  SpO2 96% Wt Readings from Last 3 Encounters:  11/30/11 230 lb (104.327 kg)  10/06/11 230 lb (104.327 kg)  09/08/11 230 lb (104.327 kg)   Constitutional: She is obese; appears well-developed and well-nourished. No distress.  Cardiovascular: Normal rate, regular rhythm and normal heart sounds.  No murmur heard. No BLE edema. Pulmonary/Chest: Effort normal and breath sounds normal. No respiratory distress. She has no wheezes.  Back: full range of motion of thoracic and lumbar spine. Non tender to palpation. Negative straight leg raise. DTR's are symmetrically intact. Sensation intact in all dermatomes of the lower extremities. Full strength to manual muscle testing. patient is able to heel toe  walk without difficulty and ambulates with guarded and careful gait. Psychiatric: She has a normal mood and affect. Her behavior is normal. Judgment and thought content normal.   Lab Results  Component Value Date   WBC 10.0 03/19/2011   HGB 13.3 03/19/2011   HCT 40.2 03/19/2011   PLT 437* 03/19/2011   CHOL 184 08/07/2011   TRIG 95.0 08/07/2011   HDL 56.70 08/07/2011   ALT 20 08/07/2011   AST 18 08/07/2011   NA 142 05/13/2011   K 4.3 05/13/2011   CL 109 05/13/2011   CREATININE 0.9 05/13/2011   BUN 23 05/13/2011   CO2 28 05/13/2011   TSH 2.09 10/06/2011   INR 1.9 11/19/2011   HGBA1C  Value: 6.2 (NOTE) The ADA recommends the following therapeutic goal for glycemic control related to Hgb A1c measurement: Goal of therapy: <6.5 Hgb A1c  Reference: American Diabetes Association: Clinical Practice Recommendations 2010, Diabetes Care, 2010, 33: (Suppl  1).* 11/03/2009          Assessment & Plan:   low back pain with R>L radicular pain - gradual onset over past 2-3 months -  Check L spine xray, pred taper x 12d,  Muscle relaxer and PT Unable to have MRI due to PPM If unimproved, consider refer for [facility]

## 2011-12-01 ENCOUNTER — Encounter (HOSPITAL_COMMUNITY): Payer: Self-pay

## 2011-12-02 ENCOUNTER — Encounter (HOSPITAL_COMMUNITY): Payer: Self-pay

## 2011-12-03 ENCOUNTER — Encounter (HOSPITAL_COMMUNITY)
Admission: RE | Admit: 2011-12-03 | Discharge: 2011-12-03 | Disposition: A | Discharge status: Home / Self Care | Payer: Self-pay | Source: Ambulatory Visit | Attending: Cardiology | Admitting: Cardiology

## 2011-12-08 ENCOUNTER — Encounter (HOSPITAL_COMMUNITY)
Admission: RE | Admit: 2011-12-08 | Discharge: 2011-12-08 | Disposition: A | Discharge status: Home / Self Care | Payer: Self-pay | Source: Ambulatory Visit | Attending: Cardiology | Admitting: Cardiology

## 2011-12-08 DIAGNOSIS — Z7902 Long term (current) use of antithrombotics/antiplatelets: Secondary | ICD-10-CM | POA: Insufficient documentation

## 2011-12-08 DIAGNOSIS — I2582 Chronic total occlusion of coronary artery: Secondary | ICD-10-CM | POA: Insufficient documentation

## 2011-12-08 DIAGNOSIS — I509 Heart failure, unspecified: Secondary | ICD-10-CM | POA: Insufficient documentation

## 2011-12-08 DIAGNOSIS — Z7982 Long term (current) use of aspirin: Secondary | ICD-10-CM | POA: Insufficient documentation

## 2011-12-08 DIAGNOSIS — E785 Hyperlipidemia, unspecified: Secondary | ICD-10-CM | POA: Insufficient documentation

## 2011-12-08 DIAGNOSIS — E7800 Pure hypercholesterolemia, unspecified: Secondary | ICD-10-CM | POA: Insufficient documentation

## 2011-12-08 DIAGNOSIS — Z5189 Encounter for other specified aftercare: Secondary | ICD-10-CM | POA: Insufficient documentation

## 2011-12-08 DIAGNOSIS — I4891 Unspecified atrial fibrillation: Secondary | ICD-10-CM | POA: Insufficient documentation

## 2011-12-08 DIAGNOSIS — E669 Obesity, unspecified: Secondary | ICD-10-CM | POA: Insufficient documentation

## 2011-12-08 DIAGNOSIS — I2510 Atherosclerotic heart disease of native coronary artery without angina pectoris: Secondary | ICD-10-CM | POA: Insufficient documentation

## 2011-12-08 DIAGNOSIS — I651 Occlusion and stenosis of basilar artery: Secondary | ICD-10-CM | POA: Insufficient documentation

## 2011-12-08 DIAGNOSIS — I10 Essential (primary) hypertension: Secondary | ICD-10-CM | POA: Insufficient documentation

## 2011-12-08 DIAGNOSIS — I4892 Unspecified atrial flutter: Secondary | ICD-10-CM | POA: Insufficient documentation

## 2011-12-08 DIAGNOSIS — K219 Gastro-esophageal reflux disease without esophagitis: Secondary | ICD-10-CM | POA: Insufficient documentation

## 2011-12-08 DIAGNOSIS — Z951 Presence of aortocoronary bypass graft: Secondary | ICD-10-CM | POA: Insufficient documentation

## 2011-12-08 DIAGNOSIS — I6529 Occlusion and stenosis of unspecified carotid artery: Secondary | ICD-10-CM | POA: Insufficient documentation

## 2011-12-08 DIAGNOSIS — F17200 Nicotine dependence, unspecified, uncomplicated: Secondary | ICD-10-CM | POA: Insufficient documentation

## 2011-12-08 DIAGNOSIS — I252 Old myocardial infarction: Secondary | ICD-10-CM | POA: Insufficient documentation

## 2011-12-08 DIAGNOSIS — I2589 Other forms of chronic ischemic heart disease: Secondary | ICD-10-CM | POA: Insufficient documentation

## 2011-12-08 DIAGNOSIS — Z9861 Coronary angioplasty status: Secondary | ICD-10-CM | POA: Insufficient documentation

## 2011-12-08 DIAGNOSIS — Z8249 Family history of ischemic heart disease and other diseases of the circulatory system: Secondary | ICD-10-CM | POA: Insufficient documentation

## 2011-12-09 ENCOUNTER — Encounter (HOSPITAL_COMMUNITY): Payer: Self-pay

## 2011-12-10 ENCOUNTER — Encounter (HOSPITAL_COMMUNITY): Payer: Self-pay

## 2011-12-11 ENCOUNTER — Encounter: Payer: Self-pay | Admitting: Cardiology

## 2011-12-11 ENCOUNTER — Ambulatory Visit (INDEPENDENT_AMBULATORY_CARE_PROVIDER_SITE_OTHER): Payer: Medicare Other | Admitting: Cardiology

## 2011-12-11 DIAGNOSIS — E7800 Pure hypercholesterolemia, unspecified: Secondary | ICD-10-CM

## 2011-12-11 DIAGNOSIS — I2510 Atherosclerotic heart disease of native coronary artery without angina pectoris: Secondary | ICD-10-CM

## 2011-12-11 DIAGNOSIS — I4891 Unspecified atrial fibrillation: Secondary | ICD-10-CM

## 2011-12-11 DIAGNOSIS — J984 Other disorders of lung: Secondary | ICD-10-CM

## 2011-12-11 NOTE — Progress Notes (Signed)
HPI:  Erica Hood seems to be getting along very well.  No major complaints at this time.  Feels pretty good.  Steroids have helped as has methacarbamol.  She thinks that the statins exacerbated her symptoms.  She is better now.    Current Outpatient Prescriptions  Medication Sig Dispense Refill  . acetaminophen (TYLENOL) 500 MG tablet Take 500 mg by mouth as needed.        Marland Kitchen amLODipine (NORVASC) 10 MG tablet Take 1 tablet (10 mg total) by mouth daily.  90 tablet  3  . aspirin 81 MG tablet Take 81 mg by mouth daily.        . fluticasone (FLONASE) 50 MCG/ACT nasal spray Place 1 spray into the nose as needed.        Marland Kitchen losartan (COZAAR) 50 MG tablet Take 1 tablet (50 mg total) by mouth daily.  90 tablet  3  . methocarbamol (ROBAXIN) 500 MG tablet Take 500 mg by mouth 3 (three) times daily as needed.      . metoprolol succinate (TOPROL-XL) 100 MG 24 hr tablet Take 1 tablet (100 mg total) by mouth daily.  90 tablet  3  . oxymetazoline (AFRIN) 0.05 % nasal spray Place 2 sprays into the nose as needed.        . pantoprazole (PROTONIX) 40 MG tablet Take 1 tablet (40 mg total) by mouth daily.  90 tablet  3  . warfarin (COUMADIN) 2.5 MG tablet Take 1 tablet (2.5 mg total) by mouth as directed.  135 tablet  1  . methocarbamol (ROBAXIN) 500 MG tablet Take 1 tablet (500 mg total) by mouth 3 (three) times daily as needed (for muscle spasms).  40 tablet  1  . predniSONE (STERAPRED UNI-PAK) 10 MG tablet Take 1 tablet (10 mg total) by mouth as directed. As directed x 12 days  42 tablet  0  . DISCONTD: fluticasone (FLONASE) 50 MCG/ACT nasal spray 1 spray by Nasal route daily.  16 g  2  . DISCONTD: oxymetazoline (AFRIN) 0.05 % nasal spray 2 sprays by Nasal route 2 (two) times daily.  15 mL  2    No Known Allergies  Past Medical History  Diagnosis Date  . OBESITY   . CAD, ARTERY BYPASS GRAFT 11/2009  . LUNG NODULE     stable RLL nodule CT 04/2010 and 10/2010 and 03/2011  . Cardiac pacemaker in situ 04/2010    due  to SSS  . PAF (paroxysmal atrial fibrillation)     chronic anticoag  . MYOCARDIAL INFARCTION, ACUTE, ANTERIOR WALL 11/2009  . Hypertension   . HYPERCHOLESTEROLEMIA  IIA   . Depression   . ARTHRITIS, CERVICAL SPINE   . Thyroid nodule 2011    neg bx 01/2010    Past Surgical History  Procedure Date  . Coronary artery bypass graft 11-2009  . Cholecystectomy   . Appendectomy   . Breast lumpectomy 1967    right breast (bening)  . Tonsillectomy   . Angioplasty     ans stenting of her LAD    Family History  Problem Relation Age of Onset  . Other      Significant for premature cardiovascular disease affecting her father in his 67's  . Alcohol abuse      family hx addiction (parent)  . Arthritis      parent  . Hypertension      parent and other relative  . Cervical cancer Mother   . Hypothyroidism Daughter  History   Social History  . Marital Status: Married    Spouse Name: N/A    Number of Children: N/A  . 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 Smoking status: Former Smoker    Quit date: 11/02/2009  . Smokeless tobacco: Not on file  . Alcohol Use: No  . Drug Use: No  . Sexually Active: Not on file   Other Topics Concern  . Not on file   Social History Narrative  . No narrative on file    ROS: Please see the HPI.  All other systems reviewed and negative.  PHYSICAL EXAM:  BP 144/78  Pulse 60  Ht 5\' 4"  (1.626 m)  Wt 238 lb 12.8 oz (108.319 kg)  BMI 40.99 kg/m2  General: Well developed, well nourished, in no acute distress. Head:  Normocephalic and atraumatic. Neck: no JVD Lungs: slight prolongation of expiration.  No wheezes.   Heart: Normal S1 and S2.  No murmur, rubs.  Prom S4 gallop.    Abdomen:  Normal bowel sounds; soft; non tender; no organomegaly Pulses: Pulses normal in all 4 extremities. Extremities: No clubbing or cyanosis. No edema. Neurologic: Alert and  oriented x 3.  EKG:  NSR.  Anterior MI, age undetermined.    ASSESSMENT AND PLAN:

## 2011-12-11 NOTE — Patient Instructions (Signed)
Your physician recommends that you schedule a follow-up appointment in: 4 months.  Your physician recommends that you continue on your current medications as directed. Please refer to the Current Medication list given to you today.  

## 2011-12-13 NOTE — Assessment & Plan Note (Signed)
Not much recurrence that we can tell.  Stable on warfarin.

## 2011-12-13 NOTE — Assessment & Plan Note (Signed)
Getting along well.  No current chest pain.  Continue medical therapy without change.

## 2011-12-13 NOTE — Assessment & Plan Note (Signed)
Totally stable at fourteen months.  Prob needs 2 year study, then no further if stable.

## 2011-12-13 NOTE — Assessment & Plan Note (Signed)
Unable to take statins.  Should continue to work on diet.

## 2011-12-15 ENCOUNTER — Encounter: Payer: Self-pay | Admitting: Internal Medicine

## 2011-12-15 ENCOUNTER — Other Ambulatory Visit (INDEPENDENT_AMBULATORY_CARE_PROVIDER_SITE_OTHER): Payer: Medicare Other

## 2011-12-15 ENCOUNTER — Ambulatory Visit (INDEPENDENT_AMBULATORY_CARE_PROVIDER_SITE_OTHER): Payer: Medicare Other | Admitting: Internal Medicine

## 2011-12-15 ENCOUNTER — Encounter (HOSPITAL_COMMUNITY): Payer: Self-pay

## 2011-12-15 VITALS — BP 112/60 | HR 73 | Temp 100.2°F

## 2011-12-15 DIAGNOSIS — R509 Fever, unspecified: Secondary | ICD-10-CM | POA: Insufficient documentation

## 2011-12-15 DIAGNOSIS — IMO0002 Reserved for concepts with insufficient information to code with codable children: Secondary | ICD-10-CM

## 2011-12-15 DIAGNOSIS — M5416 Radiculopathy, lumbar region: Secondary | ICD-10-CM

## 2011-12-15 DIAGNOSIS — R7309 Other abnormal glucose: Secondary | ICD-10-CM

## 2011-12-15 DIAGNOSIS — R739 Hyperglycemia, unspecified: Secondary | ICD-10-CM | POA: Insufficient documentation

## 2011-12-15 LAB — SEDIMENTATION RATE: Sed Rate: 59 mm/hr — ABNORMAL HIGH (ref 0–22)

## 2011-12-15 LAB — BASIC METABOLIC PANEL
BUN: 17 mg/dL (ref 6–23)
CO2: 30 mEq/L (ref 19–32)
Chloride: 100 mEq/L (ref 96–112)
Creatinine, Ser: 0.8 mg/dL (ref 0.4–1.2)

## 2011-12-15 LAB — CBC WITH DIFFERENTIAL/PLATELET
Eosinophils Absolute: 0.1 10*3/uL (ref 0.0–0.7)
MCHC: 33.3 g/dL (ref 30.0–36.0)
MCV: 91.8 fl (ref 78.0–100.0)
Monocytes Absolute: 1.6 10*3/uL — ABNORMAL HIGH (ref 0.1–1.0)
Neutrophils Relative %: 63.6 % (ref 43.0–77.0)
Platelets: 211 10*3/uL (ref 150.0–400.0)
RDW: 13.3 % (ref 11.5–14.6)

## 2011-12-15 LAB — HEMOGLOBIN A1C: Hgb A1c MFr Bld: 6.7 % — ABNORMAL HIGH (ref 4.6–6.5)

## 2011-12-15 NOTE — Patient Instructions (Signed)
It was good to see you today. Test(s) ordered today. Your results will be called to you after review (48-72hours after test completion). If any changes need to be made, you will be notified at that time. continue Robaxin for muscle relaxer as discussed for pain relief - we'll make referral to new spine specilaist for your hip, leg and back pain. Our office will contact you regarding appointment(s) once made.

## 2011-12-15 NOTE — Progress Notes (Signed)
Subjective:    Patient ID: Erica Hood, female    DOB: Sep 05, 1941, 71 y.o.   MRN: [id number]  Back Pain Associated symptoms include leg pain. Pertinent negatives include no chest pain or fever.  Leg Pain   Hip Pain   continued RLE pain down posterior thigh and knee -  Also L hip pain laterally and buttock RLE pain is 8/10 "severe" and constant  associated with weakness and "buckling" sensation of knee and hamstring-  No joint swelling No injury or falls +chronic low back pain and "stiff"/"tight, worse in past 1 week in lumbar as well as t and C spine Hx same with eval by Nsurg (elsner) 11/2010 - symptoms worse with activity, better with rest Min improved with Tylenol; Much improved with pred pak rx 11/30/11 x 4 days, then slow recurrence - min improved with robaxin Also improved with PT - ongoing now  Past Medical History  Diagnosis Date  . OBESITY   . CAD, ARTERY BYPASS GRAFT 11/2009  . LUNG NODULE     stable RLL nodule CT 04/2010 and 10/2010 and 03/2011  . 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 PAF (paroxysmal atrial fibrillation)     chronic anticoag  . MYOCARDIAL INFARCTION, ACUTE, ANTERIOR WALL 11/2009  . Hypertension   . HYPERCHOLESTEROLEMIA  IIA   . Depression   . ARTHRITIS, CERVICAL SPINE   . Thyroid nodule 2011    neg bx 01/2010    Review of Systems  Constitutional: Negative for fever.  Respiratory: Negative for cough and shortness of breath.   Cardiovascular: Negative for chest pain and leg swelling.  Musculoskeletal: Positive for back pain.       Objective:   Physical Exam  BP 112/60  Pulse 73  Temp(Src) 100.2 F (37.9 C) (Oral)  SpO2 97% Wt Readings from Last 3 Encounters:  12/11/11 238 lb 12.8 oz (108.319 kg)  11/30/11 230 lb (104.327 kg)  10/06/11 230 lb (104.327 kg)   Constitutional: She is obese; appears well-developed and well-nourished. No distress.  Cardiovascular: Normal rate, regular rhythm and normal heart sounds.  No murmur  heard. No BLE edema. Pulmonary/Chest: Effort normal and breath sounds normal. No respiratory distress. She has no wheezes.  Back: full range of motion of thoracic and lumbar spine. Non tender to palpation. Negative straight leg raise. DTR's are symmetrically intact. Sensation intact in all dermatomes of the lower extremities. Full strength to manual muscle testing. patient is able to heel toe walk without difficulty and ambulates bend forward with guarded, antalgic and careful gait. Psychiatric: She has a normal mood and affect. Her behavior is normal. Judgment and thought content normal.   Lab Results  Component Value Date   WBC 10.0 03/19/2011   HGB 13.3 03/19/2011   HCT 40.2 03/19/2011   PLT 437* 03/19/2011   CHOL 184 08/07/2011   TRIG 95.0 08/07/2011   HDL 56.70 08/07/2011   ALT 20 08/07/2011   AST 18 08/07/2011   NA 142 05/13/2011   K 4.3 05/13/2011   CL 109 05/13/2011   CREATININE 0.9 05/13/2011   BUN 23 05/13/2011   CO2 28 05/13/2011   TSH 2.09 10/06/2011   INR 1.9 11/19/2011   HGBA1C  Value: 6.2 (NOTE) The ADA recommends the following therapeutic goal for glycemic control related to Hgb A1c measurement: Goal of therapy: <6.5 Hgb A1c  Reference: American Diabetes Association: Clinical Practice Recommendations 2010, Diabetes Care, 2010, 33: (Suppl  1).* 11/03/2009  Lab Results  Component Value Date   ESRSEDRATE 29* 10/06/2011     Dg Lumbar Spine 2-3 Views  11/30/2011  *RADIOLOGY REPORT*  Clinical Data: Back pain, left hip pain.  LUMBAR SPINE - 2-3 VIEW  Comparison: None.  Findings: Degenerative facet disease throughout the lumbar spine. Degenerative disc disease changes at the L5 S1 with disc space narrowing.  Normal alignment.  No fracture.  SI joints are symmetric and unremarkable.  IMPRESSION: Spondylosis.  No acute findings.  Original Report Authenticated By: Cyndie Chime, M.D.       Assessment & Plan:   low back pain with R>L radicular pain - low grade fever - no URI, resp or GI  symptoms Hyperglycemia hx -    gradual onset over past 2-3 months -  reviewed L spine xray 11/30/11: spondylosis  improved briefly on pred taper x 12d,  Muscle relaxer and ongoing PT Check labs given LGF, check CT (Unable to have MRI due to PPM) and refer to spine specialist to consider ESI or other therapy Also check a1c (not done since hosp for RVR AFib)

## 2011-12-16 ENCOUNTER — Encounter (HOSPITAL_COMMUNITY): Payer: Self-pay

## 2011-12-17 ENCOUNTER — Encounter (HOSPITAL_COMMUNITY): Payer: Self-pay

## 2011-12-17 ENCOUNTER — Ambulatory Visit (INDEPENDENT_AMBULATORY_CARE_PROVIDER_SITE_OTHER): Payer: Medicare Other

## 2011-12-17 ENCOUNTER — Telehealth: Payer: Self-pay | Admitting: *Deleted

## 2011-12-17 ENCOUNTER — Telehealth: Payer: Self-pay

## 2011-12-17 DIAGNOSIS — I4891 Unspecified atrial fibrillation: Secondary | ICD-10-CM

## 2011-12-17 DIAGNOSIS — R911 Solitary pulmonary nodule: Secondary | ICD-10-CM

## 2011-12-17 DIAGNOSIS — M5416 Radiculopathy, lumbar region: Secondary | ICD-10-CM

## 2011-12-17 NOTE — Telephone Encounter (Signed)
Ok thanks 

## 2011-12-17 NOTE — Telephone Encounter (Signed)
Done per emr 

## 2011-12-17 NOTE — Telephone Encounter (Signed)
Erica Hood  She needs one final CT non contrast of the chest for stability of her nodule. Thanks. TS      I spoke with the pt and made her aware that she needs to have a CT of Chest without contrast to follow-up on stability of lung nodule.  The pt said she had just received a phone call from Dr Diamantina Monks office that she needs a CT of Lumbar spine.  I will try to get both of these test scheduled for the same day.  I spoke with Rose in CT scheduling and set the pt up for  12/22/11 at 10:00.  Pt aware of appointment time.

## 2011-12-17 NOTE — Telephone Encounter (Signed)
md has ordered CT/ L-spine. Per Misty Stanley need to be without. Needing order to be change. Pt is schedule for 12/22/11.... 12/17/11@2 :26pm/LMB

## 2011-12-17 NOTE — Telephone Encounter (Signed)
Message copied by Deatra James on Thu Dec 17, 2011 12:09 PM ------      Message from: Iona Coach      Created: Thu Dec 17, 2011 12:03 PM      Regarding: CT       Hey just wanted to let you know that I just spoke with pt about her needing a Ct of chest and she said that you had spoken with her about needing Ct of spine.  I saw order in system so I went ahead and scheduled these tests on 12/22/11 at 10.            Leotis Shames

## 2011-12-21 NOTE — Telephone Encounter (Signed)
Received call from rose was entered by Dr. Jonny Ruiz to without... 12/21/11@10 :28am/LMB

## 2011-12-22 ENCOUNTER — Ambulatory Visit (INDEPENDENT_AMBULATORY_CARE_PROVIDER_SITE_OTHER)
Admission: RE | Admit: 2011-12-22 | Discharge: 2011-12-22 | Disposition: A | Discharge status: Home / Self Care | Payer: Medicare Other | Source: Ambulatory Visit | Attending: Cardiology | Admitting: Cardiology

## 2011-12-22 ENCOUNTER — Encounter (HOSPITAL_COMMUNITY): Payer: Medicare Other

## 2011-12-22 ENCOUNTER — Ambulatory Visit (INDEPENDENT_AMBULATORY_CARE_PROVIDER_SITE_OTHER)
Admission: RE | Admit: 2011-12-22 | Discharge: 2011-12-22 | Disposition: A | Discharge status: Home / Self Care | Payer: Medicare Other | Source: Ambulatory Visit | Attending: Internal Medicine | Admitting: Internal Medicine

## 2011-12-22 DIAGNOSIS — IMO0002 Reserved for concepts with insufficient information to code with codable children: Secondary | ICD-10-CM

## 2011-12-22 DIAGNOSIS — M5416 Radiculopathy, lumbar region: Secondary | ICD-10-CM

## 2011-12-22 DIAGNOSIS — R911 Solitary pulmonary nodule: Secondary | ICD-10-CM

## 2011-12-23 ENCOUNTER — Encounter (HOSPITAL_COMMUNITY): Payer: Medicare Other

## 2011-12-24 ENCOUNTER — Encounter (HOSPITAL_COMMUNITY): Payer: Self-pay

## 2011-12-29 ENCOUNTER — Encounter (HOSPITAL_COMMUNITY)
Admission: RE | Admit: 2011-12-29 | Discharge: 2011-12-29 | Disposition: A | Discharge status: Home / Self Care | Payer: Self-pay | Source: Ambulatory Visit | Attending: Cardiology | Admitting: Cardiology

## 2011-12-30 ENCOUNTER — Encounter (HOSPITAL_COMMUNITY)
Admission: RE | Admit: 2011-12-30 | Discharge: 2011-12-30 | Disposition: A | Discharge status: Home / Self Care | Payer: Self-pay | Source: Ambulatory Visit | Attending: Cardiology | Admitting: Cardiology

## 2011-12-31 ENCOUNTER — Encounter (HOSPITAL_COMMUNITY)
Admission: RE | Admit: 2011-12-31 | Discharge: 2011-12-31 | Disposition: A | Discharge status: Home / Self Care | Payer: Self-pay | Source: Ambulatory Visit | Attending: Cardiology | Admitting: Cardiology

## 2012-01-05 ENCOUNTER — Encounter (HOSPITAL_COMMUNITY)
Admission: RE | Admit: 2012-01-05 | Discharge: 2012-01-05 | Disposition: A | Discharge status: Home / Self Care | Payer: Self-pay | Source: Ambulatory Visit | Attending: Cardiology | Admitting: Cardiology

## 2012-01-05 DIAGNOSIS — Z7902 Long term (current) use of antithrombotics/antiplatelets: Secondary | ICD-10-CM | POA: Insufficient documentation

## 2012-01-05 DIAGNOSIS — I509 Heart failure, unspecified: Secondary | ICD-10-CM | POA: Insufficient documentation

## 2012-01-05 DIAGNOSIS — Z9861 Coronary angioplasty status: Secondary | ICD-10-CM | POA: Insufficient documentation

## 2012-01-05 DIAGNOSIS — Z7982 Long term (current) use of aspirin: Secondary | ICD-10-CM | POA: Insufficient documentation

## 2012-01-05 DIAGNOSIS — I651 Occlusion and stenosis of basilar artery: Secondary | ICD-10-CM | POA: Insufficient documentation

## 2012-01-05 DIAGNOSIS — Z5189 Encounter for other specified aftercare: Secondary | ICD-10-CM | POA: Insufficient documentation

## 2012-01-05 DIAGNOSIS — I6529 Occlusion and stenosis of unspecified carotid artery: Secondary | ICD-10-CM | POA: Insufficient documentation

## 2012-01-05 DIAGNOSIS — E7800 Pure hypercholesterolemia, unspecified: Secondary | ICD-10-CM | POA: Insufficient documentation

## 2012-01-05 DIAGNOSIS — I4892 Unspecified atrial flutter: Secondary | ICD-10-CM | POA: Insufficient documentation

## 2012-01-05 DIAGNOSIS — I4891 Unspecified atrial fibrillation: Secondary | ICD-10-CM | POA: Insufficient documentation

## 2012-01-05 DIAGNOSIS — E669 Obesity, unspecified: Secondary | ICD-10-CM | POA: Insufficient documentation

## 2012-01-05 DIAGNOSIS — E785 Hyperlipidemia, unspecified: Secondary | ICD-10-CM | POA: Insufficient documentation

## 2012-01-05 DIAGNOSIS — I2589 Other forms of chronic ischemic heart disease: Secondary | ICD-10-CM | POA: Insufficient documentation

## 2012-01-05 DIAGNOSIS — F17200 Nicotine dependence, unspecified, uncomplicated: Secondary | ICD-10-CM | POA: Insufficient documentation

## 2012-01-05 DIAGNOSIS — K219 Gastro-esophageal reflux disease without esophagitis: Secondary | ICD-10-CM | POA: Insufficient documentation

## 2012-01-05 DIAGNOSIS — I252 Old myocardial infarction: Secondary | ICD-10-CM | POA: Insufficient documentation

## 2012-01-05 DIAGNOSIS — Z8249 Family history of ischemic heart disease and other diseases of the circulatory system: Secondary | ICD-10-CM | POA: Insufficient documentation

## 2012-01-05 DIAGNOSIS — I10 Essential (primary) hypertension: Secondary | ICD-10-CM | POA: Insufficient documentation

## 2012-01-05 DIAGNOSIS — Z951 Presence of aortocoronary bypass graft: Secondary | ICD-10-CM | POA: Insufficient documentation

## 2012-01-05 DIAGNOSIS — I2582 Chronic total occlusion of coronary artery: Secondary | ICD-10-CM | POA: Insufficient documentation

## 2012-01-05 DIAGNOSIS — I2510 Atherosclerotic heart disease of native coronary artery without angina pectoris: Secondary | ICD-10-CM | POA: Insufficient documentation

## 2012-01-06 ENCOUNTER — Encounter (HOSPITAL_COMMUNITY)
Admission: RE | Admit: 2012-01-06 | Discharge: 2012-01-06 | Disposition: A | Discharge status: Home / Self Care | Payer: Self-pay | Source: Ambulatory Visit | Attending: Cardiology | Admitting: Cardiology

## 2012-01-07 ENCOUNTER — Ambulatory Visit (INDEPENDENT_AMBULATORY_CARE_PROVIDER_SITE_OTHER): Payer: Medicare Other | Admitting: Pharmacist

## 2012-01-07 ENCOUNTER — Encounter (HOSPITAL_COMMUNITY)
Admission: RE | Admit: 2012-01-07 | Discharge: 2012-01-07 | Disposition: A | Discharge status: Home / Self Care | Payer: Self-pay | Source: Ambulatory Visit | Attending: Cardiology | Admitting: Cardiology

## 2012-01-07 DIAGNOSIS — I4891 Unspecified atrial fibrillation: Secondary | ICD-10-CM

## 2012-01-07 LAB — POCT INR: INR: 2.6

## 2012-01-12 ENCOUNTER — Encounter (HOSPITAL_COMMUNITY)
Admission: RE | Admit: 2012-01-12 | Discharge: 2012-01-12 | Disposition: A | Discharge status: Home / Self Care | Payer: Self-pay | Source: Ambulatory Visit | Attending: Cardiology | Admitting: Cardiology

## 2012-01-13 ENCOUNTER — Encounter (HOSPITAL_COMMUNITY)
Admission: RE | Admit: 2012-01-13 | Discharge: 2012-01-13 | Disposition: A | Discharge status: Home / Self Care | Payer: Self-pay | Source: Ambulatory Visit | Attending: Cardiology | Admitting: Cardiology

## 2012-01-14 ENCOUNTER — Encounter: Payer: Medicare Other | Admitting: *Deleted

## 2012-01-14 ENCOUNTER — Encounter (HOSPITAL_COMMUNITY): Payer: Self-pay

## 2012-01-18 ENCOUNTER — Encounter: Payer: Self-pay | Admitting: *Deleted

## 2012-01-19 ENCOUNTER — Encounter (HOSPITAL_COMMUNITY): Payer: Self-pay

## 2012-01-20 ENCOUNTER — Encounter (HOSPITAL_COMMUNITY)
Admission: RE | Admit: 2012-01-20 | Discharge: 2012-01-20 | Disposition: A | Discharge status: Home / Self Care | Payer: Self-pay | Source: Ambulatory Visit | Attending: Cardiology | Admitting: Cardiology

## 2012-01-20 ENCOUNTER — Telehealth: Payer: Self-pay | Admitting: Cardiology

## 2012-01-20 NOTE — Telephone Encounter (Signed)
Pt is having an injection in her back and she needs clearance to come off coumadin for 7 days and please fax approval to Dr. Noel Gerold office at [phone] [facility]

## 2012-01-21 ENCOUNTER — Encounter (HOSPITAL_COMMUNITY)
Admission: RE | Admit: 2012-01-21 | Discharge: 2012-01-21 | Disposition: A | Discharge status: Home / Self Care | Payer: Self-pay | Source: Ambulatory Visit | Attending: Cardiology | Admitting: Cardiology

## 2012-01-26 ENCOUNTER — Encounter (HOSPITAL_COMMUNITY)
Admission: RE | Admit: 2012-01-26 | Discharge: 2012-01-26 | Disposition: A | Discharge status: Home / Self Care | Payer: Self-pay | Source: Ambulatory Visit | Attending: Cardiology | Admitting: Cardiology

## 2012-01-26 NOTE — Telephone Encounter (Signed)
FU Call: Pt wanting to speak with nurse to see if MD approves for pt to stop taking coumadin for spinal injections. Please return pt call to discuss further.

## 2012-01-26 NOTE — Telephone Encounter (Signed)
Patient can come off of coumadin for procedures.  She will be at increased risk for stroke while off as expected.  However, she is symptomatic.

## 2012-01-27 ENCOUNTER — Encounter (HOSPITAL_COMMUNITY)
Admission: RE | Admit: 2012-01-27 | Discharge: 2012-01-27 | Disposition: A | Discharge status: Home / Self Care | Payer: Self-pay | Source: Ambulatory Visit | Attending: Cardiology | Admitting: Cardiology

## 2012-01-27 NOTE — Telephone Encounter (Signed)
PT RTN CALL TO [place], PLS CALL [phone]

## 2012-01-27 NOTE — Telephone Encounter (Signed)
I spoke with the pt and made her aware that she can hold Coumadin prior to spinal injection per Dr Riley Kill. Message sent to Dr Noel Gerold through [facility].

## 2012-01-27 NOTE — Telephone Encounter (Signed)
Left message for pt to call back  °

## 2012-01-28 ENCOUNTER — Encounter (HOSPITAL_COMMUNITY)
Admission: RE | Admit: 2012-01-28 | Discharge: 2012-01-28 | Disposition: A | Discharge status: Home / Self Care | Payer: Self-pay | Source: Ambulatory Visit | Attending: Cardiology | Admitting: Cardiology

## 2012-02-02 ENCOUNTER — Encounter (HOSPITAL_COMMUNITY)
Admission: RE | Admit: 2012-02-02 | Discharge: 2012-02-02 | Disposition: A | Discharge status: Home / Self Care | Payer: Self-pay | Source: Ambulatory Visit | Attending: Cardiology | Admitting: Cardiology

## 2012-02-02 DIAGNOSIS — E7800 Pure hypercholesterolemia, unspecified: Secondary | ICD-10-CM | POA: Insufficient documentation

## 2012-02-02 DIAGNOSIS — I2510 Atherosclerotic heart disease of native coronary artery without angina pectoris: Secondary | ICD-10-CM | POA: Insufficient documentation

## 2012-02-02 DIAGNOSIS — I4891 Unspecified atrial fibrillation: Secondary | ICD-10-CM | POA: Insufficient documentation

## 2012-02-02 DIAGNOSIS — Z7902 Long term (current) use of antithrombotics/antiplatelets: Secondary | ICD-10-CM | POA: Insufficient documentation

## 2012-02-02 DIAGNOSIS — F17200 Nicotine dependence, unspecified, uncomplicated: Secondary | ICD-10-CM | POA: Insufficient documentation

## 2012-02-02 DIAGNOSIS — Z5189 Encounter for other specified aftercare: Secondary | ICD-10-CM | POA: Insufficient documentation

## 2012-02-02 DIAGNOSIS — I2582 Chronic total occlusion of coronary artery: Secondary | ICD-10-CM | POA: Insufficient documentation

## 2012-02-02 DIAGNOSIS — I10 Essential (primary) hypertension: Secondary | ICD-10-CM | POA: Insufficient documentation

## 2012-02-02 DIAGNOSIS — Z7982 Long term (current) use of aspirin: Secondary | ICD-10-CM | POA: Insufficient documentation

## 2012-02-02 DIAGNOSIS — I252 Old myocardial infarction: Secondary | ICD-10-CM | POA: Insufficient documentation

## 2012-02-02 DIAGNOSIS — K219 Gastro-esophageal reflux disease without esophagitis: Secondary | ICD-10-CM | POA: Insufficient documentation

## 2012-02-02 DIAGNOSIS — I4892 Unspecified atrial flutter: Secondary | ICD-10-CM | POA: Insufficient documentation

## 2012-02-02 DIAGNOSIS — I651 Occlusion and stenosis of basilar artery: Secondary | ICD-10-CM | POA: Insufficient documentation

## 2012-02-02 DIAGNOSIS — E669 Obesity, unspecified: Secondary | ICD-10-CM | POA: Insufficient documentation

## 2012-02-02 DIAGNOSIS — Z8249 Family history of ischemic heart disease and other diseases of the circulatory system: Secondary | ICD-10-CM | POA: Insufficient documentation

## 2012-02-02 DIAGNOSIS — I6529 Occlusion and stenosis of unspecified carotid artery: Secondary | ICD-10-CM | POA: Insufficient documentation

## 2012-02-02 DIAGNOSIS — Z951 Presence of aortocoronary bypass graft: Secondary | ICD-10-CM | POA: Insufficient documentation

## 2012-02-02 DIAGNOSIS — I2589 Other forms of chronic ischemic heart disease: Secondary | ICD-10-CM | POA: Insufficient documentation

## 2012-02-02 DIAGNOSIS — Z9861 Coronary angioplasty status: Secondary | ICD-10-CM | POA: Insufficient documentation

## 2012-02-02 DIAGNOSIS — I509 Heart failure, unspecified: Secondary | ICD-10-CM | POA: Insufficient documentation

## 2012-02-02 DIAGNOSIS — E785 Hyperlipidemia, unspecified: Secondary | ICD-10-CM | POA: Insufficient documentation

## 2012-02-03 ENCOUNTER — Encounter (HOSPITAL_COMMUNITY)
Admission: RE | Admit: 2012-02-03 | Discharge: 2012-02-03 | Disposition: A | Discharge status: Home / Self Care | Payer: Self-pay | Source: Ambulatory Visit | Attending: Cardiology | Admitting: Cardiology

## 2012-02-04 ENCOUNTER — Encounter (HOSPITAL_COMMUNITY): Payer: Self-pay

## 2012-02-05 ENCOUNTER — Telehealth: Payer: Self-pay | Admitting: Cardiology

## 2012-02-05 NOTE — Telephone Encounter (Signed)
New problem:  Per Joy calling, receive fax  need clarification on notes on 3/26.   Pt need an epidural injection, can she come office coumadin.

## 2012-02-05 NOTE — Telephone Encounter (Signed)
Wanted clarification as to what she was symptomatic with.  I told her it was the pain that is requiring the injection.  And if she needs to have injection to help with this she will be at increased risk of stroke while off medicartion

## 2012-02-09 ENCOUNTER — Encounter (HOSPITAL_COMMUNITY)
Admission: RE | Admit: 2012-02-09 | Discharge: 2012-02-09 | Disposition: A | Discharge status: Home / Self Care | Payer: Self-pay | Source: Ambulatory Visit | Attending: Cardiology | Admitting: Cardiology

## 2012-02-10 ENCOUNTER — Encounter (HOSPITAL_COMMUNITY)
Admission: RE | Admit: 2012-02-10 | Discharge: 2012-02-10 | Disposition: A | Discharge status: Home / Self Care | Payer: Self-pay | Source: Ambulatory Visit | Attending: Cardiology | Admitting: Cardiology

## 2012-02-11 ENCOUNTER — Encounter (HOSPITAL_COMMUNITY)
Admission: RE | Admit: 2012-02-11 | Discharge: 2012-02-11 | Disposition: A | Discharge status: Home / Self Care | Payer: Self-pay | Source: Ambulatory Visit | Attending: Cardiology | Admitting: Cardiology

## 2012-02-12 ENCOUNTER — Ambulatory Visit (INDEPENDENT_AMBULATORY_CARE_PROVIDER_SITE_OTHER): Payer: Medicare Other

## 2012-02-12 DIAGNOSIS — I4891 Unspecified atrial fibrillation: Secondary | ICD-10-CM

## 2012-02-12 LAB — POCT INR: INR: 2.2

## 2012-02-16 ENCOUNTER — Encounter (HOSPITAL_COMMUNITY)
Admission: RE | Admit: 2012-02-16 | Discharge: 2012-02-16 | Disposition: A | Discharge status: Home / Self Care | Payer: Self-pay | Source: Ambulatory Visit | Attending: Cardiology | Admitting: Cardiology

## 2012-02-17 ENCOUNTER — Encounter (HOSPITAL_COMMUNITY): Payer: Self-pay

## 2012-02-18 ENCOUNTER — Encounter (HOSPITAL_COMMUNITY): Payer: Self-pay

## 2012-02-22 ENCOUNTER — Ambulatory Visit (INDEPENDENT_AMBULATORY_CARE_PROVIDER_SITE_OTHER): Payer: Medicare Other

## 2012-02-22 DIAGNOSIS — I4891 Unspecified atrial fibrillation: Secondary | ICD-10-CM

## 2012-02-22 LAB — POCT INR: INR: 1.1

## 2012-02-23 ENCOUNTER — Encounter (HOSPITAL_COMMUNITY): Payer: Self-pay

## 2012-02-24 ENCOUNTER — Encounter (HOSPITAL_COMMUNITY): Payer: Self-pay

## 2012-02-25 ENCOUNTER — Encounter (HOSPITAL_COMMUNITY)
Admission: RE | Admit: 2012-02-25 | Discharge: 2012-02-25 | Disposition: A | Discharge status: Home / Self Care | Payer: Self-pay | Source: Ambulatory Visit | Attending: Cardiology | Admitting: Cardiology

## 2012-03-01 ENCOUNTER — Encounter (HOSPITAL_COMMUNITY)
Admission: RE | Admit: 2012-03-01 | Discharge: 2012-03-01 | Disposition: A | Discharge status: Home / Self Care | Payer: Self-pay | Source: Ambulatory Visit | Attending: Cardiology | Admitting: Cardiology

## 2012-03-01 ENCOUNTER — Ambulatory Visit (INDEPENDENT_AMBULATORY_CARE_PROVIDER_SITE_OTHER): Payer: Medicare Other

## 2012-03-01 DIAGNOSIS — I4891 Unspecified atrial fibrillation: Secondary | ICD-10-CM

## 2012-03-01 LAB — POCT INR: INR: 2

## 2012-03-01 MED ORDER — WARFARIN SODIUM 2.5 MG PO TABS: 2.5000 mg | ORAL_TABLET | ORAL | Status: DC

## 2012-03-02 ENCOUNTER — Encounter (HOSPITAL_COMMUNITY): Payer: Self-pay

## 2012-03-02 DIAGNOSIS — I6529 Occlusion and stenosis of unspecified carotid artery: Secondary | ICD-10-CM | POA: Insufficient documentation

## 2012-03-02 DIAGNOSIS — I4892 Unspecified atrial flutter: Secondary | ICD-10-CM | POA: Insufficient documentation

## 2012-03-02 DIAGNOSIS — Z9861 Coronary angioplasty status: Secondary | ICD-10-CM | POA: Insufficient documentation

## 2012-03-02 DIAGNOSIS — E785 Hyperlipidemia, unspecified: Secondary | ICD-10-CM | POA: Insufficient documentation

## 2012-03-02 DIAGNOSIS — F17200 Nicotine dependence, unspecified, uncomplicated: Secondary | ICD-10-CM | POA: Insufficient documentation

## 2012-03-02 DIAGNOSIS — Z7982 Long term (current) use of aspirin: Secondary | ICD-10-CM | POA: Insufficient documentation

## 2012-03-02 DIAGNOSIS — E7800 Pure hypercholesterolemia, unspecified: Secondary | ICD-10-CM | POA: Insufficient documentation

## 2012-03-02 DIAGNOSIS — I10 Essential (primary) hypertension: Secondary | ICD-10-CM | POA: Insufficient documentation

## 2012-03-02 DIAGNOSIS — Z951 Presence of aortocoronary bypass graft: Secondary | ICD-10-CM | POA: Insufficient documentation

## 2012-03-02 DIAGNOSIS — I4891 Unspecified atrial fibrillation: Secondary | ICD-10-CM | POA: Insufficient documentation

## 2012-03-02 DIAGNOSIS — I509 Heart failure, unspecified: Secondary | ICD-10-CM | POA: Insufficient documentation

## 2012-03-02 DIAGNOSIS — I252 Old myocardial infarction: Secondary | ICD-10-CM | POA: Insufficient documentation

## 2012-03-02 DIAGNOSIS — Z8249 Family history of ischemic heart disease and other diseases of the circulatory system: Secondary | ICD-10-CM | POA: Insufficient documentation

## 2012-03-02 DIAGNOSIS — Z5189 Encounter for other specified aftercare: Secondary | ICD-10-CM | POA: Insufficient documentation

## 2012-03-02 DIAGNOSIS — I2510 Atherosclerotic heart disease of native coronary artery without angina pectoris: Secondary | ICD-10-CM | POA: Insufficient documentation

## 2012-03-02 DIAGNOSIS — E669 Obesity, unspecified: Secondary | ICD-10-CM | POA: Insufficient documentation

## 2012-03-02 DIAGNOSIS — I2589 Other forms of chronic ischemic heart disease: Secondary | ICD-10-CM | POA: Insufficient documentation

## 2012-03-02 DIAGNOSIS — Z7902 Long term (current) use of antithrombotics/antiplatelets: Secondary | ICD-10-CM | POA: Insufficient documentation

## 2012-03-02 DIAGNOSIS — I651 Occlusion and stenosis of basilar artery: Secondary | ICD-10-CM | POA: Insufficient documentation

## 2012-03-02 DIAGNOSIS — I2582 Chronic total occlusion of coronary artery: Secondary | ICD-10-CM | POA: Insufficient documentation

## 2012-03-02 DIAGNOSIS — K219 Gastro-esophageal reflux disease without esophagitis: Secondary | ICD-10-CM | POA: Insufficient documentation

## 2012-03-03 ENCOUNTER — Encounter (HOSPITAL_COMMUNITY): Payer: Self-pay

## 2012-03-08 ENCOUNTER — Encounter (HOSPITAL_COMMUNITY): Payer: Self-pay

## 2012-03-09 ENCOUNTER — Encounter (HOSPITAL_COMMUNITY): Payer: Self-pay

## 2012-03-10 ENCOUNTER — Encounter (HOSPITAL_COMMUNITY): Payer: Self-pay

## 2012-03-15 ENCOUNTER — Encounter (HOSPITAL_COMMUNITY)
Admission: RE | Admit: 2012-03-15 | Discharge: 2012-03-15 | Disposition: A | Discharge status: Home / Self Care | Payer: Self-pay | Source: Ambulatory Visit | Attending: Cardiology | Admitting: Cardiology

## 2012-03-16 ENCOUNTER — Encounter (HOSPITAL_COMMUNITY)
Admission: RE | Admit: 2012-03-16 | Discharge: 2012-03-16 | Disposition: A | Discharge status: Home / Self Care | Payer: Self-pay | Source: Ambulatory Visit | Attending: Cardiology | Admitting: Cardiology

## 2012-03-17 ENCOUNTER — Encounter (HOSPITAL_COMMUNITY)
Admission: RE | Admit: 2012-03-17 | Discharge: 2012-03-17 | Disposition: A | Discharge status: Home / Self Care | Payer: Self-pay | Source: Ambulatory Visit | Attending: Cardiology | Admitting: Cardiology

## 2012-03-22 ENCOUNTER — Encounter (HOSPITAL_COMMUNITY)
Admission: RE | Admit: 2012-03-22 | Discharge: 2012-03-22 | Disposition: A | Discharge status: Home / Self Care | Payer: Self-pay | Source: Ambulatory Visit | Attending: Cardiology | Admitting: Cardiology

## 2012-03-23 ENCOUNTER — Encounter (HOSPITAL_COMMUNITY)
Admission: RE | Admit: 2012-03-23 | Discharge: 2012-03-23 | Disposition: A | Discharge status: Home / Self Care | Payer: Self-pay | Source: Ambulatory Visit | Attending: Cardiology | Admitting: Cardiology

## 2012-03-24 ENCOUNTER — Encounter (HOSPITAL_COMMUNITY)
Admission: RE | Admit: 2012-03-24 | Discharge: 2012-03-24 | Disposition: A | Discharge status: Home / Self Care | Payer: Self-pay | Source: Ambulatory Visit | Attending: Cardiology | Admitting: Cardiology

## 2012-03-29 ENCOUNTER — Ambulatory Visit (INDEPENDENT_AMBULATORY_CARE_PROVIDER_SITE_OTHER): Payer: Medicare Other | Admitting: Pharmacist

## 2012-03-29 ENCOUNTER — Encounter (HOSPITAL_COMMUNITY)
Admission: RE | Admit: 2012-03-29 | Discharge: 2012-03-29 | Disposition: A | Discharge status: Home / Self Care | Payer: Self-pay | Source: Ambulatory Visit | Attending: Cardiology | Admitting: Cardiology

## 2012-03-29 DIAGNOSIS — I4891 Unspecified atrial fibrillation: Secondary | ICD-10-CM

## 2012-03-29 LAB — POCT INR: INR: 2.5

## 2012-03-30 ENCOUNTER — Encounter (HOSPITAL_COMMUNITY)
Admission: RE | Admit: 2012-03-30 | Discharge: 2012-03-30 | Disposition: A | Discharge status: Home / Self Care | Payer: Self-pay | Source: Ambulatory Visit | Attending: Cardiology | Admitting: Cardiology

## 2012-03-31 ENCOUNTER — Encounter (HOSPITAL_COMMUNITY)
Admission: RE | Admit: 2012-03-31 | Discharge: 2012-03-31 | Disposition: A | Discharge status: Home / Self Care | Payer: Self-pay | Source: Ambulatory Visit | Attending: Cardiology | Admitting: Cardiology

## 2012-04-04 ENCOUNTER — Ambulatory Visit (INDEPENDENT_AMBULATORY_CARE_PROVIDER_SITE_OTHER): Payer: Medicare Other | Admitting: *Deleted

## 2012-04-04 DIAGNOSIS — I4891 Unspecified atrial fibrillation: Secondary | ICD-10-CM

## 2012-04-04 LAB — POCT INR: INR: 1.1

## 2012-04-04 NOTE — Patient Instructions (Signed)
Fax results to [place] at [phone]

## 2012-04-05 ENCOUNTER — Encounter (HOSPITAL_COMMUNITY): Payer: Self-pay

## 2012-04-05 DIAGNOSIS — I651 Occlusion and stenosis of basilar artery: Secondary | ICD-10-CM | POA: Insufficient documentation

## 2012-04-05 DIAGNOSIS — I509 Heart failure, unspecified: Secondary | ICD-10-CM | POA: Insufficient documentation

## 2012-04-05 DIAGNOSIS — I10 Essential (primary) hypertension: Secondary | ICD-10-CM | POA: Insufficient documentation

## 2012-04-05 DIAGNOSIS — I252 Old myocardial infarction: Secondary | ICD-10-CM | POA: Insufficient documentation

## 2012-04-05 DIAGNOSIS — I6529 Occlusion and stenosis of unspecified carotid artery: Secondary | ICD-10-CM | POA: Insufficient documentation

## 2012-04-05 DIAGNOSIS — E785 Hyperlipidemia, unspecified: Secondary | ICD-10-CM | POA: Insufficient documentation

## 2012-04-05 DIAGNOSIS — I2582 Chronic total occlusion of coronary artery: Secondary | ICD-10-CM | POA: Insufficient documentation

## 2012-04-05 DIAGNOSIS — F17200 Nicotine dependence, unspecified, uncomplicated: Secondary | ICD-10-CM | POA: Insufficient documentation

## 2012-04-05 DIAGNOSIS — Z7902 Long term (current) use of antithrombotics/antiplatelets: Secondary | ICD-10-CM | POA: Insufficient documentation

## 2012-04-05 DIAGNOSIS — Z8249 Family history of ischemic heart disease and other diseases of the circulatory system: Secondary | ICD-10-CM | POA: Insufficient documentation

## 2012-04-05 DIAGNOSIS — I4891 Unspecified atrial fibrillation: Secondary | ICD-10-CM | POA: Insufficient documentation

## 2012-04-05 DIAGNOSIS — E669 Obesity, unspecified: Secondary | ICD-10-CM | POA: Insufficient documentation

## 2012-04-05 DIAGNOSIS — I2589 Other forms of chronic ischemic heart disease: Secondary | ICD-10-CM | POA: Insufficient documentation

## 2012-04-05 DIAGNOSIS — I4892 Unspecified atrial flutter: Secondary | ICD-10-CM | POA: Insufficient documentation

## 2012-04-05 DIAGNOSIS — K219 Gastro-esophageal reflux disease without esophagitis: Secondary | ICD-10-CM | POA: Insufficient documentation

## 2012-04-05 DIAGNOSIS — Z7982 Long term (current) use of aspirin: Secondary | ICD-10-CM | POA: Insufficient documentation

## 2012-04-05 DIAGNOSIS — E7800 Pure hypercholesterolemia, unspecified: Secondary | ICD-10-CM | POA: Insufficient documentation

## 2012-04-05 DIAGNOSIS — I2510 Atherosclerotic heart disease of native coronary artery without angina pectoris: Secondary | ICD-10-CM | POA: Insufficient documentation

## 2012-04-05 DIAGNOSIS — Z951 Presence of aortocoronary bypass graft: Secondary | ICD-10-CM | POA: Insufficient documentation

## 2012-04-05 DIAGNOSIS — Z9861 Coronary angioplasty status: Secondary | ICD-10-CM | POA: Insufficient documentation

## 2012-04-05 DIAGNOSIS — Z5189 Encounter for other specified aftercare: Secondary | ICD-10-CM | POA: Insufficient documentation

## 2012-04-06 ENCOUNTER — Encounter (HOSPITAL_COMMUNITY)
Admission: RE | Admit: 2012-04-06 | Discharge: 2012-04-06 | Disposition: A | Discharge status: Home / Self Care | Payer: Self-pay | Source: Ambulatory Visit | Attending: Cardiology | Admitting: Cardiology

## 2012-04-07 ENCOUNTER — Encounter (HOSPITAL_COMMUNITY)
Admission: RE | Admit: 2012-04-07 | Discharge: 2012-04-07 | Disposition: A | Discharge status: Home / Self Care | Payer: Self-pay | Source: Ambulatory Visit | Attending: Cardiology | Admitting: Cardiology

## 2012-04-11 ENCOUNTER — Ambulatory Visit (INDEPENDENT_AMBULATORY_CARE_PROVIDER_SITE_OTHER): Payer: Medicare Other | Admitting: Cardiology

## 2012-04-11 ENCOUNTER — Encounter: Payer: Self-pay | Admitting: Cardiology

## 2012-04-11 VITALS — BP 160/80 | HR 60 | Ht 64.0 in | Wt 238.0 lb

## 2012-04-11 DIAGNOSIS — E7800 Pure hypercholesterolemia, unspecified: Secondary | ICD-10-CM

## 2012-04-11 DIAGNOSIS — M5416 Radiculopathy, lumbar region: Secondary | ICD-10-CM

## 2012-04-11 DIAGNOSIS — I4891 Unspecified atrial fibrillation: Secondary | ICD-10-CM

## 2012-04-11 DIAGNOSIS — IMO0002 Reserved for concepts with insufficient information to code with codable children: Secondary | ICD-10-CM

## 2012-04-11 DIAGNOSIS — I2510 Atherosclerotic heart disease of native coronary artery without angina pectoris: Secondary | ICD-10-CM

## 2012-04-11 DIAGNOSIS — I10 Essential (primary) hypertension: Secondary | ICD-10-CM

## 2012-04-11 LAB — BASIC METABOLIC PANEL
BUN: 22 mg/dL (ref 6–23)
Creatinine, Ser: 0.9 mg/dL (ref 0.4–1.2)
GFR: 65.15 mL/min (ref >60.00)
Potassium: 4 mEq/L (ref 3.5–5.1)

## 2012-04-11 NOTE — Assessment & Plan Note (Signed)
She is stable.  Major limitation in rehab is related to her back issue.  No chest pain.  We discussed NSAIDS in detail.  She would use Aleave on a prn basis, but I encouraged her to limit use.  Check BMET today as last K was borderline.

## 2012-04-11 NOTE — Assessment & Plan Note (Signed)
Feels like she occassionaly needs something other than Tylenol or narcotics.  We reviewed NSAIDS in detail.

## 2012-04-11 NOTE — Assessment & Plan Note (Signed)
Controlled at present.  See her comments [id number].

## 2012-04-11 NOTE — Progress Notes (Signed)
HPI:  Returns for followup. She is going to [place] in August. She's been having some problems with her back, and has had an injection. This helps somewhat. She would like to take an occasional nonsteroidal anti-inflammatory, and we had a lengthy discussion about this today in detail. I spoke with her about the interactions of drugs, including that with angiotensin receptor blockers, and also the potential for GI bleeding. We talked about the type of nonsteroidals, and also the potential to help. She is still getting back injections. She said she's also had a little bit of bladder dysfunction perhaps related to her back problems well. She denies any chest pain and is doing well in cardiac rehabilitation. She says that her blood pressures tend to run in the 130 range.  They are otherwise not elevated.    Current Outpatient Prescriptions  Medication Sig Dispense Refill  . acetaminophen (TYLENOL) 500 MG tablet Take 500 mg by mouth as needed.        Marland Kitchen amLODipine (NORVASC) 10 MG tablet Take 1 tablet (10 mg total) by mouth daily.  90 tablet  3  . aspirin 81 MG tablet Take 81 mg by mouth daily.        Marland Kitchen HYDROcodone-acetaminophen (NORCO) 5-325 MG per tablet Take 1 tablet by mouth every 6 (six) hours as needed.      Marland Kitchen losartan (COZAAR) 50 MG tablet Take 1 tablet (50 mg total) by mouth daily.  90 tablet  3  . methocarbamol (ROBAXIN) 500 MG tablet Take 500 mg by mouth 3 (three) times daily as needed.      . metoprolol succinate (TOPROL-XL) 100 MG 24 hr tablet Take 1 tablet (100 mg total) by mouth daily.  90 tablet  3  . oxymetazoline (AFRIN) 0.05 % nasal spray Place 2 sprays into the nose as needed.        . pantoprazole (PROTONIX) 40 MG tablet Take 1 tablet (40 mg total) by mouth daily.  90 tablet  3  . warfarin (COUMADIN) 2.5 MG tablet Take 1 tablet (2.5 mg total) by mouth as directed.  135 tablet  1  . fluticasone (FLONASE) 50 MCG/ACT nasal spray Place 1 spray into the nose as needed.          No Known  Allergies  Past Medical History  Diagnosis Date  . OBESITY   . CAD, ARTERY BYPASS GRAFT 11/2009  . LUNG NODULE     stable RLL nodule CT 04/2010 and 10/2010 and 03/2011  . 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 PAF (paroxysmal atrial fibrillation)     chronic anticoag  . MYOCARDIAL INFARCTION, ACUTE, ANTERIOR WALL 11/2009  . Hypertension   . HYPERCHOLESTEROLEMIA  IIA   . Depression   . ARTHRITIS, CERVICAL SPINE   . Thyroid nodule 2011    neg bx 01/2010    Past Surgical History  Procedure Date  . Coronary artery bypass graft 11-2009  . Cholecystectomy   . Appendectomy   . Breast lumpectomy 1967    right breast (bening)  . Tonsillectomy   . Angioplasty     ans stenting of her LAD    Family History  Problem Relation Age of Onset  . Other      Significant for premature cardiovascular disease affecting her father in his 49's  . Alcohol abuse      family hx addiction (parent)  . Arthritis      parent  . Hypertension  parent and other relative  . Cervical cancer Mother   . Hypothyroidism Daughter     History   Social History  . Marital Status: Married    Spouse Name: N/A    Number of Children: N/A  . 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 Smoking status: Former Smoker    Quit date: 11/02/2009  . Smokeless tobacco: Not on file  . Alcohol Use: No  . Drug Use: No  . Sexually Active: Not on file   Other Topics Concern  . Not on file   Social History Narrative  . No narrative on file    ROS: Please see the HPI.  All other systems reviewed and negative.  PHYSICAL EXAM:  BP 160/80  Pulse 60  Ht 5\' 4"  (1.626 m)  Wt 238 lb (107.956 kg)  BMI 40.85 kg/m2  General: Well developed, well nourished, in no acute distress. Head:  Normocephalic and atraumatic. Neck: no JVD Lungs: Clear to auscultation and percussion. Heart: Normal S1 and S2.  No murmur, rubs or gallops.   Pulses: Pulses normal in all 4 extremities. Extremities: No clubbing or cyanosis. No edema. Neurologic: Alert and oriented x 3.  EKG:  Atrial pacing.  Nonspecific ST and T changes.    ASSESSMENT AND PLAN:

## 2012-04-11 NOTE — Patient Instructions (Signed)
Your physician wants you to follow-up in: 4 MONTHS You will receive a reminder letter in the mail two months in advance. If you don't receive a letter, please call our office to schedule the follow-up appointment.  

## 2012-04-11 NOTE — Assessment & Plan Note (Signed)
Cannot take statins 

## 2012-04-12 ENCOUNTER — Encounter (HOSPITAL_COMMUNITY)
Admission: RE | Admit: 2012-04-12 | Discharge: 2012-04-12 | Disposition: A | Discharge status: Home / Self Care | Payer: Self-pay | Source: Ambulatory Visit | Attending: Cardiology | Admitting: Cardiology

## 2012-04-13 ENCOUNTER — Ambulatory Visit (INDEPENDENT_AMBULATORY_CARE_PROVIDER_SITE_OTHER): Payer: Medicare Other | Admitting: *Deleted

## 2012-04-13 ENCOUNTER — Encounter (HOSPITAL_COMMUNITY)
Admission: RE | Admit: 2012-04-13 | Discharge: 2012-04-13 | Disposition: A | Discharge status: Home / Self Care | Payer: Self-pay | Source: Ambulatory Visit | Attending: Cardiology | Admitting: Cardiology

## 2012-04-13 DIAGNOSIS — I4891 Unspecified atrial fibrillation: Secondary | ICD-10-CM

## 2012-04-13 LAB — POCT INR: INR: 2

## 2012-04-14 ENCOUNTER — Encounter (HOSPITAL_COMMUNITY)
Admission: RE | Admit: 2012-04-14 | Discharge: 2012-04-14 | Disposition: A | Discharge status: Home / Self Care | Payer: Self-pay | Source: Ambulatory Visit | Attending: Cardiology | Admitting: Cardiology

## 2012-04-19 ENCOUNTER — Encounter (HOSPITAL_COMMUNITY)
Admission: RE | Admit: 2012-04-19 | Discharge: 2012-04-19 | Disposition: A | Discharge status: Home / Self Care | Payer: Self-pay | Source: Ambulatory Visit | Attending: Cardiology | Admitting: Cardiology

## 2012-04-19 ENCOUNTER — Encounter: Payer: Self-pay | Admitting: Internal Medicine

## 2012-04-19 ENCOUNTER — Ambulatory Visit (INDEPENDENT_AMBULATORY_CARE_PROVIDER_SITE_OTHER): Payer: Medicare Other | Admitting: Internal Medicine

## 2012-04-19 VITALS — BP 130/66 | HR 60 | Ht 64.0 in | Wt 234.0 lb

## 2012-04-19 DIAGNOSIS — Z950 Presence of cardiac pacemaker: Secondary | ICD-10-CM

## 2012-04-19 DIAGNOSIS — R002 Palpitations: Secondary | ICD-10-CM

## 2012-04-19 DIAGNOSIS — I4891 Unspecified atrial fibrillation: Secondary | ICD-10-CM

## 2012-04-19 LAB — PACEMAKER DEVICE OBSERVATION
AL AMPLITUDE: 3.6 mv
AL IMPEDENCE PM: 462.5 Ohm
BATTERY VOLTAGE: 2.9629 V
RV LEAD AMPLITUDE: 12 mv

## 2012-04-19 NOTE — Progress Notes (Signed)
HPI Erica Hood returns today for followup. She is a very pleasant 71 year old woman with a history of symptomatic tachybradycardia syndrome, status post permanent pacemaker insertion. She denies syncope, chest pain, or shortness of breath. She has been diagnosed with lower back pain and spinal stenosis. This has limited her mobility. No peripheral edema. No Known Allergies   Current Outpatient Prescriptions  Medication Sig Dispense Refill  . acetaminophen (TYLENOL) 500 MG tablet Take 500 mg by mouth as needed.        Marland Kitchen amLODipine (NORVASC) 10 MG tablet Take 1 tablet (10 mg total) by mouth daily.  90 tablet  3  . aspirin 81 MG tablet Take 81 mg by mouth daily.        Marland Kitchen HYDROcodone-acetaminophen (NORCO) 5-325 MG per tablet Take 1 tablet by mouth every 6 (six) hours as needed.      Marland Kitchen losartan (COZAAR) 50 MG tablet Take 1 tablet (50 mg total) by mouth daily.  90 tablet  3  . methocarbamol (ROBAXIN) 500 MG tablet Take 500 mg by mouth 3 (three) times daily as needed.      . metoprolol succinate (TOPROL-XL) 100 MG 24 hr tablet Take 1 tablet (100 mg total) by mouth daily.  90 tablet  3  . oxymetazoline (AFRIN) 0.05 % nasal spray Place 2 sprays into the nose as needed.        . pantoprazole (PROTONIX) 40 MG tablet Take 1 tablet (40 mg total) by mouth daily.  90 tablet  3  . warfarin (COUMADIN) 2.5 MG tablet Take 1 tablet (2.5 mg total) by mouth as directed.  135 tablet  1  . fluticasone (FLONASE) 50 MCG/ACT nasal spray Place 1 spray into the nose as needed.           Past Medical History  Diagnosis Date  . OBESITY   . CAD, ARTERY BYPASS GRAFT 11/2009  . LUNG NODULE     stable RLL nodule CT 04/2010 and 10/2010 and 03/2011  . 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 PAF (paroxysmal atrial fibrillation)     chronic anticoag  . MYOCARDIAL INFARCTION, ACUTE, ANTERIOR WALL 11/2009  . Hypertension   . HYPERCHOLESTEROLEMIA  IIA   . Depression   . ARTHRITIS, CERVICAL SPINE   . Thyroid nodule  2011    neg bx 01/2010    ROS:   All systems reviewed and negative except as noted in the HPI.   Past Surgical History  Procedure Date  . Coronary artery bypass graft 11-2009  . Cholecystectomy   . Appendectomy   . Breast lumpectomy 1967    right breast (bening)  . Tonsillectomy   . Angioplasty     ans stenting of her LAD     Family History  Problem Relation Age of Onset  . Other      Significant for premature cardiovascular disease affecting her father in his 7's  . Alcohol abuse      family hx addiction (parent)  . Arthritis      parent  . Hypertension      parent and other relative  . Cervical cancer Mother   . Hypothyroidism Daughter      History   Social History  . Marital Status: Married    Spouse Name: N/A    Number of Children: N/A  . Years of Education: N/A   Occupational History  . retired     Archivist   Social History  Main Topics  . Smoking status: Former Smoker    Quit date: 11/02/2009  . Smokeless tobacco: Not on file  . Alcohol Use: No  . Drug Use: No  . Sexually Active: Not on file   Other Topics Concern  . Not on file   Social History Narrative  . No narrative on file     BP 130/66  Pulse 60  Ht 5\' 4"  (1.626 m)  Wt 234 lb (106.142 kg)  BMI 40.17 kg/m2  Physical Exam:  Well appearing obese, middle-aged woman, NAD HEENT: Unremarkable Neck:  No JVD, no thyromegally Lungs:  Clear with no wheezes, rales, or rhonchi. HEART:  Regular rate rhythm, no murmurs, no rubs, no clicks Abd:  soft, positive bowel sounds, no organomegally, no rebound, no guarding Ext:  2 plus pulses, no edema, no cyanosis, no clubbing Skin:  No rashes no nodules Neuro:  CN II through XII intact, motor grossly intact  DEVICE  Normal device function.  See PaceArt for details.   Assess/Plan:

## 2012-04-19 NOTE — Patient Instructions (Addendum)
Your physician wants you to follow-up in: 12 months with Dr Taylor  You will receive a reminder letter in the mail two months in advance. If you don't receive a letter, please call our office to schedule the follow-up appointment.  Remote monitoring is used to monitor your Pacemaker of ICD from home. This monitoring reduces the number of office visits required to check your device to one time per year. It allows us to keep an eye on the functioning of your device to ensure it is working properly. You are scheduled for a device check from home on 07/25/12. You may send your transmission at any time that day. If you have a wireless device, the transmission will be sent automatically. After your physician reviews your transmission, you will receive a postcard with your next transmission date.    

## 2012-04-19 NOTE — Assessment & Plan Note (Signed)
Her device is working normally. She has approximately 10 years of battery longevity. She is pacing approximately 20% of the time. Device interrogation demonstrates no atrial fibrillation.

## 2012-04-19 NOTE — Assessment & Plan Note (Signed)
These have essentially resolved since her pacemaker was placed. She will continue her current medical therapy.

## 2012-04-20 ENCOUNTER — Encounter (HOSPITAL_COMMUNITY)
Admission: RE | Admit: 2012-04-20 | Discharge: 2012-04-20 | Disposition: A | Discharge status: Home / Self Care | Payer: Self-pay | Source: Ambulatory Visit | Attending: Cardiology | Admitting: Cardiology

## 2012-04-21 ENCOUNTER — Encounter (HOSPITAL_COMMUNITY)
Admission: RE | Admit: 2012-04-21 | Discharge: 2012-04-21 | Disposition: A | Discharge status: Home / Self Care | Payer: Self-pay | Source: Ambulatory Visit | Attending: Cardiology | Admitting: Cardiology

## 2012-04-26 ENCOUNTER — Encounter (HOSPITAL_COMMUNITY)
Admission: RE | Admit: 2012-04-26 | Discharge: 2012-04-26 | Disposition: A | Discharge status: Home / Self Care | Payer: Self-pay | Source: Ambulatory Visit | Attending: Cardiology | Admitting: Cardiology

## 2012-04-27 ENCOUNTER — Encounter (HOSPITAL_COMMUNITY): Payer: Self-pay

## 2012-04-28 ENCOUNTER — Ambulatory Visit (INDEPENDENT_AMBULATORY_CARE_PROVIDER_SITE_OTHER): Payer: Medicare Other

## 2012-04-28 ENCOUNTER — Encounter (HOSPITAL_COMMUNITY)
Admission: RE | Admit: 2012-04-28 | Discharge: 2012-04-28 | Disposition: A | Discharge status: Home / Self Care | Payer: Self-pay | Source: Ambulatory Visit | Attending: Cardiology | Admitting: Cardiology

## 2012-04-28 DIAGNOSIS — I4891 Unspecified atrial fibrillation: Secondary | ICD-10-CM

## 2012-05-03 ENCOUNTER — Encounter (HOSPITAL_COMMUNITY)
Admission: RE | Admit: 2012-05-03 | Discharge: 2012-05-03 | Disposition: A | Discharge status: Home / Self Care | Payer: Self-pay | Source: Ambulatory Visit | Attending: Cardiology | Admitting: Cardiology

## 2012-05-03 DIAGNOSIS — Z5189 Encounter for other specified aftercare: Secondary | ICD-10-CM | POA: Insufficient documentation

## 2012-05-03 DIAGNOSIS — Z7902 Long term (current) use of antithrombotics/antiplatelets: Secondary | ICD-10-CM | POA: Insufficient documentation

## 2012-05-03 DIAGNOSIS — Z951 Presence of aortocoronary bypass graft: Secondary | ICD-10-CM | POA: Insufficient documentation

## 2012-05-03 DIAGNOSIS — I252 Old myocardial infarction: Secondary | ICD-10-CM | POA: Insufficient documentation

## 2012-05-03 DIAGNOSIS — I4892 Unspecified atrial flutter: Secondary | ICD-10-CM | POA: Insufficient documentation

## 2012-05-03 DIAGNOSIS — Z7982 Long term (current) use of aspirin: Secondary | ICD-10-CM | POA: Insufficient documentation

## 2012-05-03 DIAGNOSIS — E669 Obesity, unspecified: Secondary | ICD-10-CM | POA: Insufficient documentation

## 2012-05-03 DIAGNOSIS — Z9861 Coronary angioplasty status: Secondary | ICD-10-CM | POA: Insufficient documentation

## 2012-05-03 DIAGNOSIS — I6529 Occlusion and stenosis of unspecified carotid artery: Secondary | ICD-10-CM | POA: Insufficient documentation

## 2012-05-03 DIAGNOSIS — E7800 Pure hypercholesterolemia, unspecified: Secondary | ICD-10-CM | POA: Insufficient documentation

## 2012-05-03 DIAGNOSIS — I2589 Other forms of chronic ischemic heart disease: Secondary | ICD-10-CM | POA: Insufficient documentation

## 2012-05-03 DIAGNOSIS — F17200 Nicotine dependence, unspecified, uncomplicated: Secondary | ICD-10-CM | POA: Insufficient documentation

## 2012-05-03 DIAGNOSIS — Z8249 Family history of ischemic heart disease and other diseases of the circulatory system: Secondary | ICD-10-CM | POA: Insufficient documentation

## 2012-05-03 DIAGNOSIS — I2510 Atherosclerotic heart disease of native coronary artery without angina pectoris: Secondary | ICD-10-CM | POA: Insufficient documentation

## 2012-05-03 DIAGNOSIS — I2582 Chronic total occlusion of coronary artery: Secondary | ICD-10-CM | POA: Insufficient documentation

## 2012-05-03 DIAGNOSIS — E785 Hyperlipidemia, unspecified: Secondary | ICD-10-CM | POA: Insufficient documentation

## 2012-05-03 DIAGNOSIS — K219 Gastro-esophageal reflux disease without esophagitis: Secondary | ICD-10-CM | POA: Insufficient documentation

## 2012-05-03 DIAGNOSIS — I10 Essential (primary) hypertension: Secondary | ICD-10-CM | POA: Insufficient documentation

## 2012-05-03 DIAGNOSIS — I509 Heart failure, unspecified: Secondary | ICD-10-CM | POA: Insufficient documentation

## 2012-05-03 DIAGNOSIS — I4891 Unspecified atrial fibrillation: Secondary | ICD-10-CM | POA: Insufficient documentation

## 2012-05-03 DIAGNOSIS — I651 Occlusion and stenosis of basilar artery: Secondary | ICD-10-CM | POA: Insufficient documentation

## 2012-05-04 ENCOUNTER — Encounter (HOSPITAL_COMMUNITY)
Admission: RE | Admit: 2012-05-04 | Discharge: 2012-05-04 | Disposition: A | Discharge status: Home / Self Care | Payer: Self-pay | Source: Ambulatory Visit | Attending: Cardiology | Admitting: Cardiology

## 2012-05-05 ENCOUNTER — Encounter (HOSPITAL_COMMUNITY): Payer: Self-pay

## 2012-05-10 ENCOUNTER — Encounter (HOSPITAL_COMMUNITY)
Admission: RE | Admit: 2012-05-10 | Discharge: 2012-05-10 | Disposition: A | Discharge status: Home / Self Care | Payer: Self-pay | Source: Ambulatory Visit | Attending: Cardiology | Admitting: Cardiology

## 2012-05-11 ENCOUNTER — Encounter (HOSPITAL_COMMUNITY)
Admission: RE | Admit: 2012-05-11 | Discharge: 2012-05-11 | Disposition: A | Discharge status: Home / Self Care | Payer: Self-pay | Source: Ambulatory Visit | Attending: Cardiology | Admitting: Cardiology

## 2012-05-12 ENCOUNTER — Encounter (HOSPITAL_COMMUNITY)
Admission: RE | Admit: 2012-05-12 | Discharge: 2012-05-12 | Disposition: A | Discharge status: Home / Self Care | Payer: Self-pay | Source: Ambulatory Visit | Attending: Cardiology | Admitting: Cardiology

## 2012-05-17 ENCOUNTER — Encounter (HOSPITAL_COMMUNITY): Payer: Self-pay

## 2012-05-18 ENCOUNTER — Encounter (HOSPITAL_COMMUNITY): Payer: Self-pay

## 2012-05-19 ENCOUNTER — Encounter (HOSPITAL_COMMUNITY)
Admission: RE | Admit: 2012-05-19 | Discharge: 2012-05-19 | Disposition: A | Discharge status: Home / Self Care | Payer: Self-pay | Source: Ambulatory Visit | Attending: Cardiology | Admitting: Cardiology

## 2012-05-24 ENCOUNTER — Encounter (HOSPITAL_COMMUNITY)
Admission: RE | Admit: 2012-05-24 | Discharge: 2012-05-24 | Disposition: A | Discharge status: Home / Self Care | Payer: Self-pay | Source: Ambulatory Visit | Attending: Cardiology | Admitting: Cardiology

## 2012-05-25 ENCOUNTER — Encounter (HOSPITAL_COMMUNITY)
Admission: RE | Admit: 2012-05-25 | Discharge: 2012-05-25 | Disposition: A | Discharge status: Home / Self Care | Payer: Self-pay | Source: Ambulatory Visit | Attending: Cardiology | Admitting: Cardiology

## 2012-05-26 ENCOUNTER — Ambulatory Visit (INDEPENDENT_AMBULATORY_CARE_PROVIDER_SITE_OTHER): Payer: Medicare Other | Admitting: *Deleted

## 2012-05-26 ENCOUNTER — Encounter (HOSPITAL_COMMUNITY)
Admission: RE | Admit: 2012-05-26 | Discharge: 2012-05-26 | Disposition: A | Discharge status: Home / Self Care | Payer: Self-pay | Source: Ambulatory Visit | Attending: Cardiology | Admitting: Cardiology

## 2012-05-26 DIAGNOSIS — I4891 Unspecified atrial fibrillation: Secondary | ICD-10-CM

## 2012-05-31 ENCOUNTER — Encounter (HOSPITAL_COMMUNITY): Payer: Self-pay

## 2012-06-01 ENCOUNTER — Encounter (HOSPITAL_COMMUNITY): Payer: Self-pay

## 2012-06-02 ENCOUNTER — Encounter (HOSPITAL_COMMUNITY): Payer: Self-pay

## 2012-06-02 DIAGNOSIS — I4891 Unspecified atrial fibrillation: Secondary | ICD-10-CM | POA: Insufficient documentation

## 2012-06-02 DIAGNOSIS — Z9861 Coronary angioplasty status: Secondary | ICD-10-CM | POA: Insufficient documentation

## 2012-06-02 DIAGNOSIS — Z8249 Family history of ischemic heart disease and other diseases of the circulatory system: Secondary | ICD-10-CM | POA: Insufficient documentation

## 2012-06-02 DIAGNOSIS — F17200 Nicotine dependence, unspecified, uncomplicated: Secondary | ICD-10-CM | POA: Insufficient documentation

## 2012-06-02 DIAGNOSIS — I651 Occlusion and stenosis of basilar artery: Secondary | ICD-10-CM | POA: Insufficient documentation

## 2012-06-02 DIAGNOSIS — E785 Hyperlipidemia, unspecified: Secondary | ICD-10-CM | POA: Insufficient documentation

## 2012-06-02 DIAGNOSIS — I4892 Unspecified atrial flutter: Secondary | ICD-10-CM | POA: Insufficient documentation

## 2012-06-02 DIAGNOSIS — I509 Heart failure, unspecified: Secondary | ICD-10-CM | POA: Insufficient documentation

## 2012-06-02 DIAGNOSIS — I2582 Chronic total occlusion of coronary artery: Secondary | ICD-10-CM | POA: Insufficient documentation

## 2012-06-02 DIAGNOSIS — Z7982 Long term (current) use of aspirin: Secondary | ICD-10-CM | POA: Insufficient documentation

## 2012-06-02 DIAGNOSIS — Z5189 Encounter for other specified aftercare: Secondary | ICD-10-CM | POA: Insufficient documentation

## 2012-06-02 DIAGNOSIS — E669 Obesity, unspecified: Secondary | ICD-10-CM | POA: Insufficient documentation

## 2012-06-02 DIAGNOSIS — I10 Essential (primary) hypertension: Secondary | ICD-10-CM | POA: Insufficient documentation

## 2012-06-02 DIAGNOSIS — Z951 Presence of aortocoronary bypass graft: Secondary | ICD-10-CM | POA: Insufficient documentation

## 2012-06-02 DIAGNOSIS — I252 Old myocardial infarction: Secondary | ICD-10-CM | POA: Insufficient documentation

## 2012-06-02 DIAGNOSIS — I6529 Occlusion and stenosis of unspecified carotid artery: Secondary | ICD-10-CM | POA: Insufficient documentation

## 2012-06-02 DIAGNOSIS — K219 Gastro-esophageal reflux disease without esophagitis: Secondary | ICD-10-CM | POA: Insufficient documentation

## 2012-06-02 DIAGNOSIS — Z7902 Long term (current) use of antithrombotics/antiplatelets: Secondary | ICD-10-CM | POA: Insufficient documentation

## 2012-06-02 DIAGNOSIS — E7800 Pure hypercholesterolemia, unspecified: Secondary | ICD-10-CM | POA: Insufficient documentation

## 2012-06-02 DIAGNOSIS — I2510 Atherosclerotic heart disease of native coronary artery without angina pectoris: Secondary | ICD-10-CM | POA: Insufficient documentation

## 2012-06-02 DIAGNOSIS — I2589 Other forms of chronic ischemic heart disease: Secondary | ICD-10-CM | POA: Insufficient documentation

## 2012-06-07 ENCOUNTER — Encounter (HOSPITAL_COMMUNITY): Payer: Self-pay

## 2012-06-08 ENCOUNTER — Encounter (HOSPITAL_COMMUNITY): Payer: Self-pay

## 2012-06-09 ENCOUNTER — Encounter (HOSPITAL_COMMUNITY): Payer: Self-pay

## 2012-06-14 ENCOUNTER — Encounter (HOSPITAL_COMMUNITY): Payer: Self-pay

## 2012-06-15 ENCOUNTER — Encounter (HOSPITAL_COMMUNITY): Payer: Self-pay

## 2012-06-16 ENCOUNTER — Encounter (HOSPITAL_COMMUNITY): Payer: Self-pay

## 2012-06-21 ENCOUNTER — Encounter (HOSPITAL_COMMUNITY)
Admission: RE | Admit: 2012-06-21 | Discharge: 2012-06-21 | Disposition: A | Discharge status: Home / Self Care | Payer: Self-pay | Source: Ambulatory Visit | Attending: Cardiology | Admitting: Cardiology

## 2012-06-22 ENCOUNTER — Encounter (HOSPITAL_COMMUNITY)
Admission: RE | Admit: 2012-06-22 | Discharge: 2012-06-22 | Disposition: A | Discharge status: Home / Self Care | Payer: Self-pay | Source: Ambulatory Visit | Attending: Cardiology | Admitting: Cardiology

## 2012-06-23 ENCOUNTER — Ambulatory Visit (INDEPENDENT_AMBULATORY_CARE_PROVIDER_SITE_OTHER): Payer: Medicare Other | Admitting: *Deleted

## 2012-06-23 ENCOUNTER — Encounter (HOSPITAL_COMMUNITY): Payer: Self-pay

## 2012-06-23 DIAGNOSIS — I4891 Unspecified atrial fibrillation: Secondary | ICD-10-CM

## 2012-06-28 ENCOUNTER — Encounter (HOSPITAL_COMMUNITY)
Admission: RE | Admit: 2012-06-28 | Discharge: 2012-06-28 | Disposition: A | Discharge status: Home / Self Care | Payer: Self-pay | Source: Ambulatory Visit | Attending: Cardiology | Admitting: Cardiology

## 2012-06-29 ENCOUNTER — Encounter (HOSPITAL_COMMUNITY)
Admission: RE | Admit: 2012-06-29 | Discharge: 2012-06-29 | Disposition: A | Discharge status: Home / Self Care | Payer: Self-pay | Source: Ambulatory Visit | Attending: Cardiology | Admitting: Cardiology

## 2012-06-30 ENCOUNTER — Encounter (HOSPITAL_COMMUNITY)
Admission: RE | Admit: 2012-06-30 | Discharge: 2012-06-30 | Disposition: A | Discharge status: Home / Self Care | Payer: Self-pay | Source: Ambulatory Visit | Attending: Cardiology | Admitting: Cardiology

## 2012-07-05 ENCOUNTER — Encounter (HOSPITAL_COMMUNITY)
Admission: RE | Admit: 2012-07-05 | Discharge: 2012-07-05 | Disposition: A | Discharge status: Home / Self Care | Payer: Self-pay | Source: Ambulatory Visit | Attending: Cardiology | Admitting: Cardiology

## 2012-07-05 DIAGNOSIS — I509 Heart failure, unspecified: Secondary | ICD-10-CM | POA: Insufficient documentation

## 2012-07-05 DIAGNOSIS — I651 Occlusion and stenosis of basilar artery: Secondary | ICD-10-CM | POA: Insufficient documentation

## 2012-07-05 DIAGNOSIS — I10 Essential (primary) hypertension: Secondary | ICD-10-CM | POA: Insufficient documentation

## 2012-07-05 DIAGNOSIS — E785 Hyperlipidemia, unspecified: Secondary | ICD-10-CM | POA: Insufficient documentation

## 2012-07-05 DIAGNOSIS — I4891 Unspecified atrial fibrillation: Secondary | ICD-10-CM | POA: Insufficient documentation

## 2012-07-05 DIAGNOSIS — E669 Obesity, unspecified: Secondary | ICD-10-CM | POA: Insufficient documentation

## 2012-07-05 DIAGNOSIS — I2510 Atherosclerotic heart disease of native coronary artery without angina pectoris: Secondary | ICD-10-CM | POA: Insufficient documentation

## 2012-07-05 DIAGNOSIS — Z9861 Coronary angioplasty status: Secondary | ICD-10-CM | POA: Insufficient documentation

## 2012-07-05 DIAGNOSIS — I2589 Other forms of chronic ischemic heart disease: Secondary | ICD-10-CM | POA: Insufficient documentation

## 2012-07-05 DIAGNOSIS — I2582 Chronic total occlusion of coronary artery: Secondary | ICD-10-CM | POA: Insufficient documentation

## 2012-07-05 DIAGNOSIS — I252 Old myocardial infarction: Secondary | ICD-10-CM | POA: Insufficient documentation

## 2012-07-05 DIAGNOSIS — Z951 Presence of aortocoronary bypass graft: Secondary | ICD-10-CM | POA: Insufficient documentation

## 2012-07-05 DIAGNOSIS — I4892 Unspecified atrial flutter: Secondary | ICD-10-CM | POA: Insufficient documentation

## 2012-07-05 DIAGNOSIS — E7800 Pure hypercholesterolemia, unspecified: Secondary | ICD-10-CM | POA: Insufficient documentation

## 2012-07-05 DIAGNOSIS — Z7982 Long term (current) use of aspirin: Secondary | ICD-10-CM | POA: Insufficient documentation

## 2012-07-05 DIAGNOSIS — I6529 Occlusion and stenosis of unspecified carotid artery: Secondary | ICD-10-CM | POA: Insufficient documentation

## 2012-07-05 DIAGNOSIS — Z7902 Long term (current) use of antithrombotics/antiplatelets: Secondary | ICD-10-CM | POA: Insufficient documentation

## 2012-07-05 DIAGNOSIS — Z8249 Family history of ischemic heart disease and other diseases of the circulatory system: Secondary | ICD-10-CM | POA: Insufficient documentation

## 2012-07-05 DIAGNOSIS — K219 Gastro-esophageal reflux disease without esophagitis: Secondary | ICD-10-CM | POA: Insufficient documentation

## 2012-07-05 DIAGNOSIS — Z5189 Encounter for other specified aftercare: Secondary | ICD-10-CM | POA: Insufficient documentation

## 2012-07-05 DIAGNOSIS — F17200 Nicotine dependence, unspecified, uncomplicated: Secondary | ICD-10-CM | POA: Insufficient documentation

## 2012-07-06 ENCOUNTER — Encounter: Payer: Self-pay | Admitting: Pharmacist

## 2012-07-06 ENCOUNTER — Encounter (HOSPITAL_COMMUNITY)
Admission: RE | Admit: 2012-07-06 | Discharge: 2012-07-06 | Disposition: A | Discharge status: Home / Self Care | Payer: Self-pay | Source: Ambulatory Visit | Attending: Cardiology | Admitting: Cardiology

## 2012-07-07 ENCOUNTER — Encounter (HOSPITAL_COMMUNITY)
Admission: RE | Admit: 2012-07-07 | Discharge: 2012-07-07 | Disposition: A | Discharge status: Home / Self Care | Payer: Self-pay | Source: Ambulatory Visit | Attending: Cardiology | Admitting: Cardiology

## 2012-07-12 ENCOUNTER — Encounter (HOSPITAL_COMMUNITY): Payer: Self-pay

## 2012-07-13 ENCOUNTER — Encounter (HOSPITAL_COMMUNITY)
Admission: RE | Admit: 2012-07-13 | Discharge: 2012-07-13 | Disposition: A | Discharge status: Home / Self Care | Payer: Self-pay | Source: Ambulatory Visit | Attending: Cardiology | Admitting: Cardiology

## 2012-07-14 ENCOUNTER — Encounter (HOSPITAL_COMMUNITY)
Admission: RE | Admit: 2012-07-14 | Discharge: 2012-07-14 | Disposition: A | Discharge status: Home / Self Care | Payer: Self-pay | Source: Ambulatory Visit | Attending: Cardiology | Admitting: Cardiology

## 2012-07-14 ENCOUNTER — Ambulatory Visit (INDEPENDENT_AMBULATORY_CARE_PROVIDER_SITE_OTHER): Payer: Medicare Other | Admitting: *Deleted

## 2012-07-14 DIAGNOSIS — I4891 Unspecified atrial fibrillation: Secondary | ICD-10-CM

## 2012-07-14 LAB — POCT INR: INR: 3.1

## 2012-07-19 ENCOUNTER — Encounter (HOSPITAL_COMMUNITY)
Admission: RE | Admit: 2012-07-19 | Discharge: 2012-07-19 | Disposition: A | Discharge status: Home / Self Care | Payer: Self-pay | Source: Ambulatory Visit | Attending: Cardiology | Admitting: Cardiology

## 2012-07-20 ENCOUNTER — Encounter (HOSPITAL_COMMUNITY)
Admission: RE | Admit: 2012-07-20 | Discharge: 2012-07-20 | Disposition: A | Discharge status: Home / Self Care | Payer: Self-pay | Source: Ambulatory Visit | Attending: Cardiology | Admitting: Cardiology

## 2012-07-21 ENCOUNTER — Encounter (HOSPITAL_COMMUNITY)
Admission: RE | Admit: 2012-07-21 | Discharge: 2012-07-21 | Disposition: A | Discharge status: Home / Self Care | Payer: Self-pay | Source: Ambulatory Visit | Attending: Cardiology | Admitting: Cardiology

## 2012-07-25 ENCOUNTER — Encounter: Payer: Medicare Other | Admitting: *Deleted

## 2012-07-26 ENCOUNTER — Encounter (HOSPITAL_COMMUNITY)
Admission: RE | Admit: 2012-07-26 | Discharge: 2012-07-26 | Disposition: A | Discharge status: Home / Self Care | Payer: Self-pay | Source: Ambulatory Visit | Attending: Cardiology | Admitting: Cardiology

## 2012-07-27 ENCOUNTER — Encounter (HOSPITAL_COMMUNITY)
Admission: RE | Admit: 2012-07-27 | Discharge: 2012-07-27 | Disposition: A | Discharge status: Home / Self Care | Payer: Self-pay | Source: Ambulatory Visit | Attending: Cardiology | Admitting: Cardiology

## 2012-07-28 ENCOUNTER — Encounter (HOSPITAL_COMMUNITY)
Admission: RE | Admit: 2012-07-28 | Discharge: 2012-07-28 | Disposition: A | Discharge status: Home / Self Care | Payer: Self-pay | Source: Ambulatory Visit | Attending: Cardiology | Admitting: Cardiology

## 2012-07-28 ENCOUNTER — Encounter: Payer: Self-pay | Admitting: *Deleted

## 2012-08-02 ENCOUNTER — Encounter (HOSPITAL_COMMUNITY): Payer: Self-pay

## 2012-08-02 DIAGNOSIS — E785 Hyperlipidemia, unspecified: Secondary | ICD-10-CM | POA: Insufficient documentation

## 2012-08-02 DIAGNOSIS — Z5189 Encounter for other specified aftercare: Secondary | ICD-10-CM | POA: Insufficient documentation

## 2012-08-02 DIAGNOSIS — Z9861 Coronary angioplasty status: Secondary | ICD-10-CM | POA: Insufficient documentation

## 2012-08-02 DIAGNOSIS — I4892 Unspecified atrial flutter: Secondary | ICD-10-CM | POA: Insufficient documentation

## 2012-08-02 DIAGNOSIS — Z7902 Long term (current) use of antithrombotics/antiplatelets: Secondary | ICD-10-CM | POA: Insufficient documentation

## 2012-08-02 DIAGNOSIS — Z8249 Family history of ischemic heart disease and other diseases of the circulatory system: Secondary | ICD-10-CM | POA: Insufficient documentation

## 2012-08-02 DIAGNOSIS — Z7982 Long term (current) use of aspirin: Secondary | ICD-10-CM | POA: Insufficient documentation

## 2012-08-02 DIAGNOSIS — I651 Occlusion and stenosis of basilar artery: Secondary | ICD-10-CM | POA: Insufficient documentation

## 2012-08-02 DIAGNOSIS — I509 Heart failure, unspecified: Secondary | ICD-10-CM | POA: Insufficient documentation

## 2012-08-02 DIAGNOSIS — I252 Old myocardial infarction: Secondary | ICD-10-CM | POA: Insufficient documentation

## 2012-08-02 DIAGNOSIS — F17200 Nicotine dependence, unspecified, uncomplicated: Secondary | ICD-10-CM | POA: Insufficient documentation

## 2012-08-02 DIAGNOSIS — I2582 Chronic total occlusion of coronary artery: Secondary | ICD-10-CM | POA: Insufficient documentation

## 2012-08-02 DIAGNOSIS — E669 Obesity, unspecified: Secondary | ICD-10-CM | POA: Insufficient documentation

## 2012-08-02 DIAGNOSIS — I2589 Other forms of chronic ischemic heart disease: Secondary | ICD-10-CM | POA: Insufficient documentation

## 2012-08-02 DIAGNOSIS — E7800 Pure hypercholesterolemia, unspecified: Secondary | ICD-10-CM | POA: Insufficient documentation

## 2012-08-02 DIAGNOSIS — K219 Gastro-esophageal reflux disease without esophagitis: Secondary | ICD-10-CM | POA: Insufficient documentation

## 2012-08-02 DIAGNOSIS — I6529 Occlusion and stenosis of unspecified carotid artery: Secondary | ICD-10-CM | POA: Insufficient documentation

## 2012-08-02 DIAGNOSIS — Z951 Presence of aortocoronary bypass graft: Secondary | ICD-10-CM | POA: Insufficient documentation

## 2012-08-02 DIAGNOSIS — I2510 Atherosclerotic heart disease of native coronary artery without angina pectoris: Secondary | ICD-10-CM | POA: Insufficient documentation

## 2012-08-02 DIAGNOSIS — I10 Essential (primary) hypertension: Secondary | ICD-10-CM | POA: Insufficient documentation

## 2012-08-02 DIAGNOSIS — I4891 Unspecified atrial fibrillation: Secondary | ICD-10-CM | POA: Insufficient documentation

## 2012-08-03 ENCOUNTER — Encounter (HOSPITAL_COMMUNITY)
Admission: RE | Admit: 2012-08-03 | Discharge: 2012-08-03 | Disposition: A | Discharge status: Home / Self Care | Payer: Self-pay | Source: Ambulatory Visit | Attending: Cardiology | Admitting: Cardiology

## 2012-08-04 ENCOUNTER — Ambulatory Visit (INDEPENDENT_AMBULATORY_CARE_PROVIDER_SITE_OTHER): Payer: Medicare Other | Admitting: Pharmacist

## 2012-08-04 ENCOUNTER — Encounter (HOSPITAL_COMMUNITY)
Admission: RE | Admit: 2012-08-04 | Discharge: 2012-08-04 | Disposition: A | Discharge status: Home / Self Care | Payer: Self-pay | Source: Ambulatory Visit | Attending: Cardiology | Admitting: Cardiology

## 2012-08-04 DIAGNOSIS — I4891 Unspecified atrial fibrillation: Secondary | ICD-10-CM

## 2012-08-04 LAB — POCT INR: INR: 2

## 2012-08-09 ENCOUNTER — Encounter (HOSPITAL_COMMUNITY)
Admission: RE | Admit: 2012-08-09 | Discharge: 2012-08-09 | Disposition: A | Discharge status: Home / Self Care | Payer: Self-pay | Source: Ambulatory Visit | Attending: Cardiology | Admitting: Cardiology

## 2012-08-10 ENCOUNTER — Encounter (HOSPITAL_COMMUNITY)
Admission: RE | Admit: 2012-08-10 | Discharge: 2012-08-10 | Disposition: A | Discharge status: Home / Self Care | Payer: Self-pay | Source: Ambulatory Visit | Attending: Cardiology | Admitting: Cardiology

## 2012-08-11 ENCOUNTER — Encounter (HOSPITAL_COMMUNITY)
Admission: RE | Admit: 2012-08-11 | Discharge: 2012-08-11 | Disposition: A | Discharge status: Home / Self Care | Payer: Self-pay | Source: Ambulatory Visit | Attending: Cardiology | Admitting: Cardiology

## 2012-08-16 ENCOUNTER — Encounter (HOSPITAL_COMMUNITY)
Admission: RE | Admit: 2012-08-16 | Discharge: 2012-08-16 | Disposition: A | Discharge status: Home / Self Care | Payer: Self-pay | Source: Ambulatory Visit | Attending: Cardiology | Admitting: Cardiology

## 2012-08-17 ENCOUNTER — Encounter (HOSPITAL_COMMUNITY)
Admission: RE | Admit: 2012-08-17 | Discharge: 2012-08-17 | Disposition: A | Discharge status: Home / Self Care | Payer: Self-pay | Source: Ambulatory Visit | Attending: Cardiology | Admitting: Cardiology

## 2012-08-18 ENCOUNTER — Encounter (HOSPITAL_COMMUNITY): Payer: Self-pay

## 2012-08-23 ENCOUNTER — Encounter (HOSPITAL_COMMUNITY)
Admission: RE | Admit: 2012-08-23 | Discharge: 2012-08-23 | Disposition: A | Discharge status: Home / Self Care | Payer: Self-pay | Source: Ambulatory Visit | Attending: Cardiology | Admitting: Cardiology

## 2012-08-24 ENCOUNTER — Encounter (HOSPITAL_COMMUNITY)
Admission: RE | Admit: 2012-08-24 | Discharge: 2012-08-24 | Disposition: A | Discharge status: Home / Self Care | Payer: Self-pay | Source: Ambulatory Visit | Attending: Cardiology | Admitting: Cardiology

## 2012-08-25 ENCOUNTER — Encounter (HOSPITAL_COMMUNITY)
Admission: RE | Admit: 2012-08-25 | Discharge: 2012-08-25 | Disposition: A | Discharge status: Home / Self Care | Payer: Self-pay | Source: Ambulatory Visit | Attending: Cardiology | Admitting: Cardiology

## 2012-08-30 ENCOUNTER — Encounter (HOSPITAL_COMMUNITY)
Admission: RE | Admit: 2012-08-30 | Discharge: 2012-08-30 | Disposition: A | Discharge status: Home / Self Care | Payer: Self-pay | Source: Ambulatory Visit | Attending: Cardiology | Admitting: Cardiology

## 2012-08-31 ENCOUNTER — Encounter (HOSPITAL_COMMUNITY): Payer: Self-pay

## 2012-09-01 ENCOUNTER — Encounter (HOSPITAL_COMMUNITY): Payer: Self-pay

## 2012-09-05 DIAGNOSIS — I6529 Occlusion and stenosis of unspecified carotid artery: Secondary | ICD-10-CM | POA: Insufficient documentation

## 2012-09-05 DIAGNOSIS — I2510 Atherosclerotic heart disease of native coronary artery without angina pectoris: Secondary | ICD-10-CM | POA: Insufficient documentation

## 2012-09-05 DIAGNOSIS — I4891 Unspecified atrial fibrillation: Secondary | ICD-10-CM | POA: Insufficient documentation

## 2012-09-05 DIAGNOSIS — I2589 Other forms of chronic ischemic heart disease: Secondary | ICD-10-CM | POA: Insufficient documentation

## 2012-09-05 DIAGNOSIS — Z7982 Long term (current) use of aspirin: Secondary | ICD-10-CM | POA: Insufficient documentation

## 2012-09-05 DIAGNOSIS — I651 Occlusion and stenosis of basilar artery: Secondary | ICD-10-CM | POA: Insufficient documentation

## 2012-09-05 DIAGNOSIS — Z5189 Encounter for other specified aftercare: Secondary | ICD-10-CM | POA: Insufficient documentation

## 2012-09-05 DIAGNOSIS — Z8249 Family history of ischemic heart disease and other diseases of the circulatory system: Secondary | ICD-10-CM | POA: Insufficient documentation

## 2012-09-05 DIAGNOSIS — I4892 Unspecified atrial flutter: Secondary | ICD-10-CM | POA: Insufficient documentation

## 2012-09-05 DIAGNOSIS — E7800 Pure hypercholesterolemia, unspecified: Secondary | ICD-10-CM | POA: Insufficient documentation

## 2012-09-05 DIAGNOSIS — K219 Gastro-esophageal reflux disease without esophagitis: Secondary | ICD-10-CM | POA: Insufficient documentation

## 2012-09-05 DIAGNOSIS — E669 Obesity, unspecified: Secondary | ICD-10-CM | POA: Insufficient documentation

## 2012-09-05 DIAGNOSIS — Z7902 Long term (current) use of antithrombotics/antiplatelets: Secondary | ICD-10-CM | POA: Insufficient documentation

## 2012-09-05 DIAGNOSIS — F17200 Nicotine dependence, unspecified, uncomplicated: Secondary | ICD-10-CM | POA: Insufficient documentation

## 2012-09-05 DIAGNOSIS — I10 Essential (primary) hypertension: Secondary | ICD-10-CM | POA: Insufficient documentation

## 2012-09-05 DIAGNOSIS — I509 Heart failure, unspecified: Secondary | ICD-10-CM | POA: Insufficient documentation

## 2012-09-05 DIAGNOSIS — Z9861 Coronary angioplasty status: Secondary | ICD-10-CM | POA: Insufficient documentation

## 2012-09-05 DIAGNOSIS — Z951 Presence of aortocoronary bypass graft: Secondary | ICD-10-CM | POA: Insufficient documentation

## 2012-09-05 DIAGNOSIS — E785 Hyperlipidemia, unspecified: Secondary | ICD-10-CM | POA: Insufficient documentation

## 2012-09-05 DIAGNOSIS — I252 Old myocardial infarction: Secondary | ICD-10-CM | POA: Insufficient documentation

## 2012-09-05 DIAGNOSIS — I2582 Chronic total occlusion of coronary artery: Secondary | ICD-10-CM | POA: Insufficient documentation

## 2012-09-06 ENCOUNTER — Ambulatory Visit (INDEPENDENT_AMBULATORY_CARE_PROVIDER_SITE_OTHER): Payer: Medicare Other | Admitting: *Deleted

## 2012-09-06 ENCOUNTER — Ambulatory Visit (INDEPENDENT_AMBULATORY_CARE_PROVIDER_SITE_OTHER): Payer: Medicare Other | Admitting: Cardiology

## 2012-09-06 ENCOUNTER — Encounter (HOSPITAL_COMMUNITY): Payer: Self-pay

## 2012-09-06 ENCOUNTER — Encounter: Payer: Self-pay | Admitting: Cardiology

## 2012-09-06 ENCOUNTER — Encounter: Payer: Self-pay | Admitting: Internal Medicine

## 2012-09-06 VITALS — BP 152/80 | HR 63 | Ht 64.0 in | Wt 236.0 lb

## 2012-09-06 DIAGNOSIS — I4891 Unspecified atrial fibrillation: Secondary | ICD-10-CM

## 2012-09-06 DIAGNOSIS — I2510 Atherosclerotic heart disease of native coronary artery without angina pectoris: Secondary | ICD-10-CM

## 2012-09-06 DIAGNOSIS — Z950 Presence of cardiac pacemaker: Secondary | ICD-10-CM

## 2012-09-06 LAB — HEPATIC FUNCTION PANEL
ALT: 24 U/L (ref 0–35)
AST: 23 U/L (ref 0–37)
Albumin: 4.3 g/dL (ref 3.5–5.2)
Alkaline Phosphatase: 94 U/L (ref 39–117)

## 2012-09-06 LAB — PACEMAKER DEVICE OBSERVATION
AL AMPLITUDE: 4 mv
AL IMPEDENCE PM: 460 Ohm
AL THRESHOLD: 0.875 V
DEVICE MODEL PM: 7107049
RV LEAD IMPEDENCE PM: 380 Ohm
RV LEAD THRESHOLD: 0.875 V
VENTRICULAR PACING PM: 1

## 2012-09-06 LAB — BASIC METABOLIC PANEL
Calcium: 9.5 mg/dL (ref 8.4–10.5)
Creatinine, Ser: 0.8 mg/dL (ref 0.4–1.2)
GFR: 81.35 mL/min (ref >60.00)
Sodium: 139 mEq/L (ref 135–145)

## 2012-09-06 LAB — POCT INR: INR: 1.8

## 2012-09-06 LAB — LIPID PANEL
Cholesterol: 241 mg/dL — ABNORMAL HIGH (ref 0–200)
Triglycerides: 175 mg/dL — ABNORMAL HIGH (ref 0.0–149.0)

## 2012-09-06 NOTE — Assessment & Plan Note (Signed)
Stable status.  Has DES and also surgery.  Remains on ASA.

## 2012-09-06 NOTE — Progress Notes (Signed)
HPI:  Patient in for follow up.  She is doing really well.  No chest pain.  Back remains a problem at present, but she is getting shots.  Held warfarin during that time.  Still takes a little bit of daily narcotics.    Current Outpatient Prescriptions  Medication Sig Dispense Refill  . acetaminophen (TYLENOL) 500 MG tablet Take 500 mg by mouth as needed.        Marland Kitchen amLODipine (NORVASC) 10 MG tablet Take 1 tablet (10 mg total) by mouth daily.  90 tablet  3  . aspirin 81 MG tablet Take 81 mg by mouth daily.        Marland Kitchen HYDROcodone-acetaminophen (NORCO) 5-325 MG per tablet Take 1 tablet by mouth every 6 (six) hours as needed.      Marland Kitchen losartan (COZAAR) 50 MG tablet Take 1 tablet (50 mg total) by mouth daily.  90 tablet  3  . methocarbamol (ROBAXIN) 500 MG tablet Take 500 mg by mouth 3 (three) times daily as needed.      . metoprolol succinate (TOPROL-XL) 100 MG 24 hr tablet Take 1 tablet (100 mg total) by mouth daily.  90 tablet  3  . pantoprazole (PROTONIX) 40 MG tablet Take 1 tablet (40 mg total) by mouth daily.  90 tablet  3  . warfarin (COUMADIN) 2.5 MG tablet Take 1 tablet (2.5 mg total) by mouth as directed.  135 tablet  1    No Known Allergies  Past Medical History  Diagnosis Date  . OBESITY   . CAD, ARTERY BYPASS GRAFT 11/2009  . LUNG NODULE     stable RLL nodule CT 04/2010 and 10/2010 and 03/2011  . 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 PAF (paroxysmal atrial fibrillation)     chronic anticoag  . MYOCARDIAL INFARCTION, ACUTE, ANTERIOR WALL 11/2009  . Hypertension   . HYPERCHOLESTEROLEMIA  IIA   . Depression   . ARTHRITIS, CERVICAL SPINE   . Thyroid nodule 2011    neg bx 01/2010    Past Surgical History  Procedure Date  . Coronary artery bypass graft 11-2009  . Cholecystectomy   . Appendectomy   . Breast lumpectomy 1967    right breast (bening)  . Tonsillectomy   . Angioplasty     ans stenting of her LAD    Family History  Problem Relation Age of Onset  .  Other      Significant for premature cardiovascular disease affecting her father in his 58's  . Alcohol abuse      family hx addiction (parent)  . Arthritis      parent  . Hypertension      parent and other relative  . Cervical cancer Mother   . Hypothyroidism Daughter     History   Social History  . Marital Status: Married    Spouse Name: N/A    Number of Children: N/A  . 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 Smoking status: Former Smoker    Quit date: 11/02/2009  . Smokeless tobacco: Not on file  . Alcohol Use: No  . Drug Use: No  . Sexually Active: Not on file   Other Topics Concern  . Not on file   Social History Narrative  . No narrative on file    ROS: Please see the HPI.  All other  systems reviewed and negative.  PHYSICAL EXAM:  BP 152/80  Pulse 63  Ht 5\' 4"  (1.626 m)  Wt 236 lb (107.049 kg)  BMI 40.51 kg/m2  SpO2 97%  General: Well developed, well nourished, in no acute distress. Head:  Normocephalic and atraumatic. Neck: no JVD.  Pacer site looks good.   Lungs: Clear to auscultation and percussion. Heart: Normal S1 and S2.  No murmur, rubs or gallops.  Pulses: Pulses normal in all 4 extremities. Extremities: No clubbing or cyanosis. No edema. Neurologic: Alert and oriented x 3.  EKG:  Atrial pacing.  Nonspecific ST and T changes.    ASSESSMENT AND PLAN:

## 2012-09-06 NOTE — Patient Instructions (Addendum)
Your physician recommends that you schedule a follow-up appointment in: 3 MONTHS  Your physician recommends that you continue on your current medications as directed. Please refer to the Current Medication list given to you today.  Your physician recommends that you have lab work today: LIPID, LIVER and BMP

## 2012-09-06 NOTE — Assessment & Plan Note (Signed)
On warfarin.  No problems.

## 2012-09-06 NOTE — Progress Notes (Signed)
Pacer check in clinic  

## 2012-09-06 NOTE — Assessment & Plan Note (Signed)
Cannot tolerate statins.  Will recheck lipid profile.

## 2012-09-07 ENCOUNTER — Encounter (HOSPITAL_COMMUNITY)
Admission: RE | Admit: 2012-09-07 | Discharge: 2012-09-07 | Disposition: A | Discharge status: Home / Self Care | Payer: Self-pay | Source: Ambulatory Visit | Attending: Cardiology | Admitting: Cardiology

## 2012-09-08 ENCOUNTER — Other Ambulatory Visit: Payer: Self-pay

## 2012-09-08 ENCOUNTER — Encounter (HOSPITAL_COMMUNITY)
Admission: RE | Admit: 2012-09-08 | Discharge: 2012-09-08 | Disposition: A | Discharge status: Home / Self Care | Payer: Self-pay | Source: Ambulatory Visit | Attending: Cardiology | Admitting: Cardiology

## 2012-09-08 MED ORDER — ROSUVASTATIN CALCIUM 5 MG PO TABS: ORAL_TABLET | ORAL | Status: DC

## 2012-09-13 ENCOUNTER — Encounter (HOSPITAL_COMMUNITY)
Admission: RE | Admit: 2012-09-13 | Discharge: 2012-09-13 | Disposition: A | Discharge status: Home / Self Care | Payer: Self-pay | Source: Ambulatory Visit | Attending: Cardiology | Admitting: Cardiology

## 2012-09-14 ENCOUNTER — Encounter (HOSPITAL_COMMUNITY)
Admission: RE | Admit: 2012-09-14 | Discharge: 2012-09-14 | Disposition: A | Discharge status: Home / Self Care | Payer: Self-pay | Source: Ambulatory Visit | Attending: Cardiology | Admitting: Cardiology

## 2012-09-15 ENCOUNTER — Encounter (HOSPITAL_COMMUNITY): Payer: Self-pay

## 2012-09-20 ENCOUNTER — Encounter (HOSPITAL_COMMUNITY)
Admission: RE | Admit: 2012-09-20 | Discharge: 2012-09-20 | Disposition: A | Discharge status: Home / Self Care | Payer: Self-pay | Source: Ambulatory Visit | Attending: Cardiology | Admitting: Cardiology

## 2012-09-21 ENCOUNTER — Encounter (HOSPITAL_COMMUNITY)
Admission: RE | Admit: 2012-09-21 | Discharge: 2012-09-21 | Disposition: A | Discharge status: Home / Self Care | Payer: Self-pay | Source: Ambulatory Visit | Attending: Cardiology | Admitting: Cardiology

## 2012-09-22 ENCOUNTER — Encounter (HOSPITAL_COMMUNITY)
Admission: RE | Admit: 2012-09-22 | Discharge: 2012-09-22 | Disposition: A | Discharge status: Home / Self Care | Payer: Self-pay | Source: Ambulatory Visit | Attending: Cardiology | Admitting: Cardiology

## 2012-09-27 ENCOUNTER — Ambulatory Visit (INDEPENDENT_AMBULATORY_CARE_PROVIDER_SITE_OTHER): Payer: Medicare Other | Admitting: *Deleted

## 2012-09-27 ENCOUNTER — Encounter (HOSPITAL_COMMUNITY)
Admission: RE | Admit: 2012-09-27 | Discharge: 2012-09-27 | Disposition: A | Discharge status: Home / Self Care | Payer: Self-pay | Source: Ambulatory Visit | Attending: Cardiology | Admitting: Cardiology

## 2012-09-27 DIAGNOSIS — I4891 Unspecified atrial fibrillation: Secondary | ICD-10-CM

## 2012-09-28 ENCOUNTER — Encounter (HOSPITAL_COMMUNITY)
Admission: RE | Admit: 2012-09-28 | Discharge: 2012-09-28 | Disposition: A | Discharge status: Home / Self Care | Payer: Self-pay | Source: Ambulatory Visit | Attending: Cardiology | Admitting: Cardiology

## 2012-09-29 ENCOUNTER — Encounter (HOSPITAL_COMMUNITY): Payer: Self-pay

## 2012-10-04 ENCOUNTER — Encounter (HOSPITAL_COMMUNITY)
Admission: RE | Admit: 2012-10-04 | Discharge: 2012-10-04 | Disposition: A | Discharge status: Home / Self Care | Payer: Self-pay | Source: Ambulatory Visit | Attending: Cardiology | Admitting: Cardiology

## 2012-10-04 DIAGNOSIS — I10 Essential (primary) hypertension: Secondary | ICD-10-CM | POA: Insufficient documentation

## 2012-10-04 DIAGNOSIS — Z7902 Long term (current) use of antithrombotics/antiplatelets: Secondary | ICD-10-CM | POA: Insufficient documentation

## 2012-10-04 DIAGNOSIS — I4892 Unspecified atrial flutter: Secondary | ICD-10-CM | POA: Insufficient documentation

## 2012-10-04 DIAGNOSIS — I252 Old myocardial infarction: Secondary | ICD-10-CM | POA: Insufficient documentation

## 2012-10-04 DIAGNOSIS — I2510 Atherosclerotic heart disease of native coronary artery without angina pectoris: Secondary | ICD-10-CM | POA: Insufficient documentation

## 2012-10-04 DIAGNOSIS — I2589 Other forms of chronic ischemic heart disease: Secondary | ICD-10-CM | POA: Insufficient documentation

## 2012-10-04 DIAGNOSIS — E785 Hyperlipidemia, unspecified: Secondary | ICD-10-CM | POA: Insufficient documentation

## 2012-10-04 DIAGNOSIS — E7800 Pure hypercholesterolemia, unspecified: Secondary | ICD-10-CM | POA: Insufficient documentation

## 2012-10-04 DIAGNOSIS — I2582 Chronic total occlusion of coronary artery: Secondary | ICD-10-CM | POA: Insufficient documentation

## 2012-10-04 DIAGNOSIS — I6529 Occlusion and stenosis of unspecified carotid artery: Secondary | ICD-10-CM | POA: Insufficient documentation

## 2012-10-04 DIAGNOSIS — K219 Gastro-esophageal reflux disease without esophagitis: Secondary | ICD-10-CM | POA: Insufficient documentation

## 2012-10-04 DIAGNOSIS — I509 Heart failure, unspecified: Secondary | ICD-10-CM | POA: Insufficient documentation

## 2012-10-04 DIAGNOSIS — Z951 Presence of aortocoronary bypass graft: Secondary | ICD-10-CM | POA: Insufficient documentation

## 2012-10-04 DIAGNOSIS — Z7982 Long term (current) use of aspirin: Secondary | ICD-10-CM | POA: Insufficient documentation

## 2012-10-04 DIAGNOSIS — Z8249 Family history of ischemic heart disease and other diseases of the circulatory system: Secondary | ICD-10-CM | POA: Insufficient documentation

## 2012-10-04 DIAGNOSIS — Z5189 Encounter for other specified aftercare: Secondary | ICD-10-CM | POA: Insufficient documentation

## 2012-10-04 DIAGNOSIS — Z9861 Coronary angioplasty status: Secondary | ICD-10-CM | POA: Insufficient documentation

## 2012-10-04 DIAGNOSIS — I4891 Unspecified atrial fibrillation: Secondary | ICD-10-CM | POA: Insufficient documentation

## 2012-10-04 DIAGNOSIS — E669 Obesity, unspecified: Secondary | ICD-10-CM | POA: Insufficient documentation

## 2012-10-04 DIAGNOSIS — F17200 Nicotine dependence, unspecified, uncomplicated: Secondary | ICD-10-CM | POA: Insufficient documentation

## 2012-10-04 DIAGNOSIS — I651 Occlusion and stenosis of basilar artery: Secondary | ICD-10-CM | POA: Insufficient documentation

## 2012-10-05 ENCOUNTER — Encounter (HOSPITAL_COMMUNITY)
Admission: RE | Admit: 2012-10-05 | Discharge: 2012-10-05 | Disposition: A | Discharge status: Home / Self Care | Payer: Self-pay | Source: Ambulatory Visit | Attending: Cardiology | Admitting: Cardiology

## 2012-10-06 ENCOUNTER — Encounter (HOSPITAL_COMMUNITY)
Admission: RE | Admit: 2012-10-06 | Discharge: 2012-10-06 | Disposition: A | Discharge status: Home / Self Care | Payer: Self-pay | Source: Ambulatory Visit | Attending: Cardiology | Admitting: Cardiology

## 2012-10-11 ENCOUNTER — Encounter (HOSPITAL_COMMUNITY): Payer: Self-pay

## 2012-10-12 ENCOUNTER — Encounter (HOSPITAL_COMMUNITY)
Admission: RE | Admit: 2012-10-12 | Discharge: 2012-10-12 | Disposition: A | Discharge status: Home / Self Care | Payer: Self-pay | Source: Ambulatory Visit | Attending: Cardiology | Admitting: Cardiology

## 2012-10-13 ENCOUNTER — Encounter (HOSPITAL_COMMUNITY)
Admission: RE | Admit: 2012-10-13 | Discharge: 2012-10-13 | Disposition: A | Discharge status: Home / Self Care | Payer: Self-pay | Source: Ambulatory Visit | Attending: Cardiology | Admitting: Cardiology

## 2012-10-17 ENCOUNTER — Other Ambulatory Visit: Payer: Self-pay | Admitting: Nurse Practitioner

## 2012-10-17 ENCOUNTER — Other Ambulatory Visit: Payer: Self-pay | Admitting: *Deleted

## 2012-10-17 DIAGNOSIS — I4891 Unspecified atrial fibrillation: Secondary | ICD-10-CM

## 2012-10-17 MED ORDER — METOPROLOL SUCCINATE ER 100 MG PO TB24: 100.0000 mg | ORAL_TABLET | Freq: Every day | ORAL | Status: DC

## 2012-10-17 NOTE — Telephone Encounter (Signed)
Fax Received. Refill Completed. Erica Hood (R.M.A)   

## 2012-10-18 ENCOUNTER — Encounter (HOSPITAL_COMMUNITY)
Admission: RE | Admit: 2012-10-18 | Discharge: 2012-10-18 | Disposition: A | Discharge status: Home / Self Care | Payer: Self-pay | Source: Ambulatory Visit | Attending: Cardiology | Admitting: Cardiology

## 2012-10-19 ENCOUNTER — Encounter (HOSPITAL_COMMUNITY): Payer: Self-pay

## 2012-10-20 ENCOUNTER — Encounter (HOSPITAL_COMMUNITY): Payer: Self-pay

## 2012-10-20 ENCOUNTER — Other Ambulatory Visit: Payer: Self-pay | Admitting: *Deleted

## 2012-10-24 ENCOUNTER — Ambulatory Visit (INDEPENDENT_AMBULATORY_CARE_PROVIDER_SITE_OTHER): Payer: Medicare Other | Admitting: *Deleted

## 2012-10-24 DIAGNOSIS — I4891 Unspecified atrial fibrillation: Secondary | ICD-10-CM

## 2012-10-24 LAB — POCT INR: INR: 2.7

## 2012-10-25 ENCOUNTER — Encounter (HOSPITAL_COMMUNITY)
Admission: RE | Admit: 2012-10-25 | Discharge: 2012-10-25 | Disposition: A | Discharge status: Home / Self Care | Payer: Self-pay | Source: Ambulatory Visit | Attending: Cardiology | Admitting: Cardiology

## 2012-10-26 ENCOUNTER — Encounter (HOSPITAL_COMMUNITY): Payer: Self-pay

## 2012-10-27 ENCOUNTER — Encounter (HOSPITAL_COMMUNITY): Payer: Self-pay

## 2012-11-01 ENCOUNTER — Encounter (HOSPITAL_COMMUNITY): Payer: Self-pay

## 2012-11-02 ENCOUNTER — Encounter (HOSPITAL_COMMUNITY): Payer: Self-pay

## 2012-11-03 ENCOUNTER — Encounter (HOSPITAL_COMMUNITY)
Admission: RE | Admit: 2012-11-03 | Discharge: 2012-11-03 | Disposition: A | Discharge status: Home / Self Care | Payer: Self-pay | Source: Ambulatory Visit | Attending: Cardiology | Admitting: Cardiology

## 2012-11-03 DIAGNOSIS — F17200 Nicotine dependence, unspecified, uncomplicated: Secondary | ICD-10-CM | POA: Insufficient documentation

## 2012-11-03 DIAGNOSIS — Z951 Presence of aortocoronary bypass graft: Secondary | ICD-10-CM | POA: Insufficient documentation

## 2012-11-03 DIAGNOSIS — Z8249 Family history of ischemic heart disease and other diseases of the circulatory system: Secondary | ICD-10-CM | POA: Insufficient documentation

## 2012-11-03 DIAGNOSIS — I4891 Unspecified atrial fibrillation: Secondary | ICD-10-CM | POA: Insufficient documentation

## 2012-11-03 DIAGNOSIS — Z9861 Coronary angioplasty status: Secondary | ICD-10-CM | POA: Insufficient documentation

## 2012-11-03 DIAGNOSIS — I2510 Atherosclerotic heart disease of native coronary artery without angina pectoris: Secondary | ICD-10-CM | POA: Insufficient documentation

## 2012-11-03 DIAGNOSIS — E785 Hyperlipidemia, unspecified: Secondary | ICD-10-CM | POA: Insufficient documentation

## 2012-11-03 DIAGNOSIS — Z5189 Encounter for other specified aftercare: Secondary | ICD-10-CM | POA: Insufficient documentation

## 2012-11-03 DIAGNOSIS — E7800 Pure hypercholesterolemia, unspecified: Secondary | ICD-10-CM | POA: Insufficient documentation

## 2012-11-03 DIAGNOSIS — K219 Gastro-esophageal reflux disease without esophagitis: Secondary | ICD-10-CM | POA: Insufficient documentation

## 2012-11-03 DIAGNOSIS — E669 Obesity, unspecified: Secondary | ICD-10-CM | POA: Insufficient documentation

## 2012-11-03 DIAGNOSIS — I651 Occlusion and stenosis of basilar artery: Secondary | ICD-10-CM | POA: Insufficient documentation

## 2012-11-03 DIAGNOSIS — I252 Old myocardial infarction: Secondary | ICD-10-CM | POA: Insufficient documentation

## 2012-11-03 DIAGNOSIS — I509 Heart failure, unspecified: Secondary | ICD-10-CM | POA: Insufficient documentation

## 2012-11-03 DIAGNOSIS — I6529 Occlusion and stenosis of unspecified carotid artery: Secondary | ICD-10-CM | POA: Insufficient documentation

## 2012-11-03 DIAGNOSIS — I4892 Unspecified atrial flutter: Secondary | ICD-10-CM | POA: Insufficient documentation

## 2012-11-03 DIAGNOSIS — Z7982 Long term (current) use of aspirin: Secondary | ICD-10-CM | POA: Insufficient documentation

## 2012-11-03 DIAGNOSIS — Z7902 Long term (current) use of antithrombotics/antiplatelets: Secondary | ICD-10-CM | POA: Insufficient documentation

## 2012-11-03 DIAGNOSIS — I2589 Other forms of chronic ischemic heart disease: Secondary | ICD-10-CM | POA: Insufficient documentation

## 2012-11-03 DIAGNOSIS — I10 Essential (primary) hypertension: Secondary | ICD-10-CM | POA: Insufficient documentation

## 2012-11-03 DIAGNOSIS — I2582 Chronic total occlusion of coronary artery: Secondary | ICD-10-CM | POA: Insufficient documentation

## 2012-11-08 ENCOUNTER — Encounter (HOSPITAL_COMMUNITY): Payer: Self-pay

## 2012-11-09 ENCOUNTER — Encounter (HOSPITAL_COMMUNITY): Payer: Self-pay

## 2012-11-10 ENCOUNTER — Encounter (HOSPITAL_COMMUNITY): Payer: Self-pay

## 2012-11-15 ENCOUNTER — Encounter (HOSPITAL_COMMUNITY)
Admission: RE | Admit: 2012-11-15 | Discharge: 2012-11-15 | Disposition: A | Discharge status: Home / Self Care | Payer: Self-pay | Source: Ambulatory Visit | Attending: Cardiology | Admitting: Cardiology

## 2012-11-16 ENCOUNTER — Encounter (HOSPITAL_COMMUNITY)
Admission: RE | Admit: 2012-11-16 | Discharge: 2012-11-16 | Disposition: A | Discharge status: Home / Self Care | Payer: Self-pay | Source: Ambulatory Visit | Attending: Cardiology | Admitting: Cardiology

## 2012-11-17 ENCOUNTER — Encounter (HOSPITAL_COMMUNITY)
Admission: RE | Admit: 2012-11-17 | Discharge: 2012-11-17 | Disposition: A | Discharge status: Home / Self Care | Payer: Self-pay | Source: Ambulatory Visit | Attending: Cardiology | Admitting: Cardiology

## 2012-11-22 ENCOUNTER — Ambulatory Visit (INDEPENDENT_AMBULATORY_CARE_PROVIDER_SITE_OTHER): Payer: Medicare Other | Admitting: *Deleted

## 2012-11-22 ENCOUNTER — Encounter (HOSPITAL_COMMUNITY)
Admission: RE | Admit: 2012-11-22 | Discharge: 2012-11-22 | Disposition: A | Discharge status: Home / Self Care | Payer: Self-pay | Source: Ambulatory Visit | Attending: Cardiology | Admitting: Cardiology

## 2012-11-22 DIAGNOSIS — I4891 Unspecified atrial fibrillation: Secondary | ICD-10-CM

## 2012-11-22 LAB — POCT INR: INR: 2.2

## 2012-11-23 ENCOUNTER — Encounter (HOSPITAL_COMMUNITY): Payer: Self-pay

## 2012-11-24 ENCOUNTER — Encounter (HOSPITAL_COMMUNITY)
Admission: RE | Admit: 2012-11-24 | Discharge: 2012-11-24 | Disposition: A | Discharge status: Home / Self Care | Payer: Self-pay | Source: Ambulatory Visit | Attending: Cardiology | Admitting: Cardiology

## 2012-11-29 ENCOUNTER — Encounter (HOSPITAL_COMMUNITY)
Admission: RE | Admit: 2012-11-29 | Discharge: 2012-11-29 | Disposition: A | Discharge status: Home / Self Care | Payer: Self-pay | Source: Ambulatory Visit | Attending: Cardiology | Admitting: Cardiology

## 2012-11-30 ENCOUNTER — Encounter (HOSPITAL_COMMUNITY): Payer: Self-pay

## 2012-12-01 ENCOUNTER — Encounter (HOSPITAL_COMMUNITY): Payer: Self-pay

## 2012-12-06 ENCOUNTER — Encounter (HOSPITAL_COMMUNITY)
Admission: RE | Admit: 2012-12-06 | Discharge: 2012-12-06 | Disposition: A | Discharge status: Home / Self Care | Payer: Self-pay | Source: Ambulatory Visit | Attending: Cardiology | Admitting: Cardiology

## 2012-12-06 DIAGNOSIS — I4891 Unspecified atrial fibrillation: Secondary | ICD-10-CM | POA: Insufficient documentation

## 2012-12-06 DIAGNOSIS — F17200 Nicotine dependence, unspecified, uncomplicated: Secondary | ICD-10-CM | POA: Insufficient documentation

## 2012-12-06 DIAGNOSIS — K219 Gastro-esophageal reflux disease without esophagitis: Secondary | ICD-10-CM | POA: Insufficient documentation

## 2012-12-06 DIAGNOSIS — Z7902 Long term (current) use of antithrombotics/antiplatelets: Secondary | ICD-10-CM | POA: Insufficient documentation

## 2012-12-06 DIAGNOSIS — I6529 Occlusion and stenosis of unspecified carotid artery: Secondary | ICD-10-CM | POA: Insufficient documentation

## 2012-12-06 DIAGNOSIS — Z951 Presence of aortocoronary bypass graft: Secondary | ICD-10-CM | POA: Insufficient documentation

## 2012-12-06 DIAGNOSIS — E7800 Pure hypercholesterolemia, unspecified: Secondary | ICD-10-CM | POA: Insufficient documentation

## 2012-12-06 DIAGNOSIS — Z8249 Family history of ischemic heart disease and other diseases of the circulatory system: Secondary | ICD-10-CM | POA: Insufficient documentation

## 2012-12-06 DIAGNOSIS — I2582 Chronic total occlusion of coronary artery: Secondary | ICD-10-CM | POA: Insufficient documentation

## 2012-12-06 DIAGNOSIS — I252 Old myocardial infarction: Secondary | ICD-10-CM | POA: Insufficient documentation

## 2012-12-06 DIAGNOSIS — I651 Occlusion and stenosis of basilar artery: Secondary | ICD-10-CM | POA: Insufficient documentation

## 2012-12-06 DIAGNOSIS — I4892 Unspecified atrial flutter: Secondary | ICD-10-CM | POA: Insufficient documentation

## 2012-12-06 DIAGNOSIS — I509 Heart failure, unspecified: Secondary | ICD-10-CM | POA: Insufficient documentation

## 2012-12-06 DIAGNOSIS — E669 Obesity, unspecified: Secondary | ICD-10-CM | POA: Insufficient documentation

## 2012-12-06 DIAGNOSIS — Z7982 Long term (current) use of aspirin: Secondary | ICD-10-CM | POA: Insufficient documentation

## 2012-12-06 DIAGNOSIS — I2510 Atherosclerotic heart disease of native coronary artery without angina pectoris: Secondary | ICD-10-CM | POA: Insufficient documentation

## 2012-12-06 DIAGNOSIS — Z9861 Coronary angioplasty status: Secondary | ICD-10-CM | POA: Insufficient documentation

## 2012-12-06 DIAGNOSIS — Z5189 Encounter for other specified aftercare: Secondary | ICD-10-CM | POA: Insufficient documentation

## 2012-12-06 DIAGNOSIS — I10 Essential (primary) hypertension: Secondary | ICD-10-CM | POA: Insufficient documentation

## 2012-12-06 DIAGNOSIS — E785 Hyperlipidemia, unspecified: Secondary | ICD-10-CM | POA: Insufficient documentation

## 2012-12-06 DIAGNOSIS — I2589 Other forms of chronic ischemic heart disease: Secondary | ICD-10-CM | POA: Insufficient documentation

## 2012-12-07 ENCOUNTER — Encounter (HOSPITAL_COMMUNITY)
Admission: RE | Admit: 2012-12-07 | Discharge: 2012-12-07 | Disposition: A | Discharge status: Home / Self Care | Payer: Self-pay | Source: Ambulatory Visit | Attending: Cardiology | Admitting: Cardiology

## 2012-12-08 ENCOUNTER — Encounter: Payer: Self-pay | Admitting: Cardiology

## 2012-12-08 ENCOUNTER — Encounter (HOSPITAL_COMMUNITY)
Admission: RE | Admit: 2012-12-08 | Discharge: 2012-12-08 | Disposition: A | Discharge status: Home / Self Care | Payer: Self-pay | Source: Ambulatory Visit | Attending: Cardiology | Admitting: Cardiology

## 2012-12-08 ENCOUNTER — Ambulatory Visit (INDEPENDENT_AMBULATORY_CARE_PROVIDER_SITE_OTHER): Payer: Medicare Other | Admitting: Cardiology

## 2012-12-08 ENCOUNTER — Ambulatory Visit (INDEPENDENT_AMBULATORY_CARE_PROVIDER_SITE_OTHER): Payer: Medicare Other | Admitting: *Deleted

## 2012-12-08 VITALS — BP 152/86 | HR 60 | Ht 64.0 in | Wt 236.0 lb

## 2012-12-08 DIAGNOSIS — I4891 Unspecified atrial fibrillation: Secondary | ICD-10-CM

## 2012-12-08 DIAGNOSIS — I495 Sick sinus syndrome: Secondary | ICD-10-CM

## 2012-12-08 DIAGNOSIS — I10 Essential (primary) hypertension: Secondary | ICD-10-CM

## 2012-12-08 DIAGNOSIS — I2510 Atherosclerotic heart disease of native coronary artery without angina pectoris: Secondary | ICD-10-CM

## 2012-12-08 LAB — PACEMAKER DEVICE OBSERVATION
AL AMPLITUDE: 3.5 mv
BAMS-0001: 150 {beats}/min
BATTERY VOLTAGE: 2.9478 V
VENTRICULAR PACING PM: 1

## 2012-12-08 NOTE — Progress Notes (Signed)
HPI: This nice lady is in for followup. Overall, she is noticed where she feels like she feels a strong pounding, and  wonders whether she is out of rhythm.  She will get some tightness, but tends to be a random process. It is not associated with exertion, and she's been able to go to rehabilitation without much difficulty. When she gets hurting in the chest, it doesn't last all that long.  It seems to be most associated with pounding or fluttering.  Does not want to consider OSA.    Current Outpatient Prescriptions  Medication Sig Dispense Refill  . acetaminophen (TYLENOL) 500 MG tablet Take 500 mg by mouth as needed.        Marland Kitchen amLODipine (NORVASC) 10 MG tablet Take 1 tablet (10 mg total) by mouth daily.  90 tablet  3  . aspirin 81 MG tablet Take 81 mg by mouth daily.        Marland Kitchen HYDROcodone-acetaminophen (NORCO) 5-325 MG per tablet Take 1 tablet by mouth every 6 (six) hours as needed.      Marland Kitchen losartan (COZAAR) 50 MG tablet Take 50 mg by mouth daily.      . methocarbamol (ROBAXIN) 500 MG tablet Take 500 mg by mouth 3 (three) times daily as needed.      . metoprolol succinate (TOPROL-XL) 100 MG 24 hr tablet Take 1 tablet (100 mg total) by mouth daily.  90 tablet  2  . pantoprazole (PROTONIX) 40 MG tablet Take 40 mg by mouth daily.      Marland Kitchen warfarin (COUMADIN) 2.5 MG tablet TAKE 1 TABLET BY MOUTH AS DIRECTED.  135 tablet  1    No Known Allergies  Past Medical History  Diagnosis Date  . OBESITY   . CAD, ARTERY BYPASS GRAFT 11/2009  . LUNG NODULE     stable RLL nodule CT 04/2010 and 10/2010 and 03/2011  . 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 PAF (paroxysmal atrial fibrillation)     chronic anticoag  . MYOCARDIAL INFARCTION, ACUTE, ANTERIOR WALL 11/2009  . Hypertension   . HYPERCHOLESTEROLEMIA  IIA   . Depression   . ARTHRITIS, CERVICAL SPINE   . Thyroid nodule 2011    neg bx 01/2010    Past Surgical History  Procedure Date  . Coronary artery bypass graft 11-2009  .  Cholecystectomy   . Appendectomy   . Breast lumpectomy 1967    right breast (bening)  . Tonsillectomy   . Angioplasty     ans stenting of her LAD    Family History  Problem Relation Age of Onset  . Other      Significant for premature cardiovascular disease affecting her father in his 26's  . Alcohol abuse      family hx addiction (parent)  . Arthritis      parent  . Hypertension      parent and other relative  . Cervical cancer Mother   . Hypothyroidism Daughter     History   Social History  . Marital Status: Married    Spouse Name: N/A    Number of Children: N/A  . 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 Smoking status: Former Smoker    Quit date: 11/02/2009  . Smokeless tobacco: Not on file  . Alcohol Use: No  . Drug Use: No  . Sexually  Active: Not on file   Other Topics Concern  . Not on file   Social History Narrative  . No narrative on file    ROS: Please see the HPI.  All other systems reviewed and negative.  PHYSICAL EXAM:  BP 152/86  Pulse 60  Ht 5\' 4"  (1.626 m)  Wt 236 lb (107.049 kg)  BMI 40.51 kg/m2  SpO2 99%  General: Very pleasant overweight female in no distress.   Head:  Normocephalic and atraumatic. Neck: no JVD Lungs: Clear to auscultation and percussion. Heart: Normal S1 and S2.  No murmur, rubs or gallops.  Abdomen:  Normal bowel sounds; soft; non tender; no organomegaly Pulses: Pulses normal in all 4 extremities. Extremities: No clubbing or cyanosis. No edema. Neurologic: Alert and oriented x 3.  EKG:  NSR.  Anteroseptal MI, age indeterminate.  Nonspecific ST changes.  Cannot exclude lateral ischemia.    ASSESSMENT AND PLAN:

## 2012-12-08 NOTE — Progress Notes (Signed)
PPM check 

## 2012-12-08 NOTE — Assessment & Plan Note (Signed)
Her symptoms are somewhat difficult to sort out. At the present time, we've interrogated her pacemaker, and she's not having atrial fibrillation. There've been virtually no mode switches. Whether or not this could represent some underlying ischemia is unclear. I elected to go ahead and recommend that she have nuclear imaging to assess for myocardial perfusion. I will see her back again next week and we'll reassess her overall situation.

## 2012-12-08 NOTE — Patient Instructions (Addendum)
Your physician recommends that you schedule a follow-up appointment in: June 2014 with Dr Ladona Ridgel for device follow-up  Your physician has requested that you have a lexiscan myoview. For further information please visit [email or web address] Please follow instruction sheet, as given.  Your physician recommends that you schedule a follow-up appointment in: 2 WEEKS with Dr Riley Kill  Your physician recommends that you continue on your current medications as directed. Please refer to the Current Medication list given to you today.

## 2012-12-12 ENCOUNTER — Other Ambulatory Visit: Payer: Self-pay | Admitting: Nurse Practitioner

## 2012-12-13 ENCOUNTER — Encounter (HOSPITAL_COMMUNITY)
Admission: RE | Admit: 2012-12-13 | Discharge: 2012-12-13 | Disposition: A | Discharge status: Home / Self Care | Payer: Self-pay | Source: Ambulatory Visit | Attending: Cardiology | Admitting: Cardiology

## 2012-12-13 NOTE — Assessment & Plan Note (Signed)
Mildly elevated at present.

## 2012-12-14 ENCOUNTER — Encounter (HOSPITAL_COMMUNITY)
Admission: RE | Admit: 2012-12-14 | Discharge: 2012-12-14 | Disposition: A | Discharge status: Home / Self Care | Payer: Self-pay | Source: Ambulatory Visit | Attending: Cardiology | Admitting: Cardiology

## 2012-12-15 ENCOUNTER — Encounter (HOSPITAL_COMMUNITY): Payer: Self-pay

## 2012-12-17 ENCOUNTER — Other Ambulatory Visit: Payer: Self-pay

## 2012-12-19 ENCOUNTER — Ambulatory Visit (HOSPITAL_COMMUNITY): Payer: Medicare Other | Attending: Internal Medicine | Admitting: Radiology

## 2012-12-19 VITALS — BP 158/64 | Ht 64.0 in | Wt 235.0 lb

## 2012-12-19 DIAGNOSIS — I252 Old myocardial infarction: Secondary | ICD-10-CM | POA: Insufficient documentation

## 2012-12-19 DIAGNOSIS — Z951 Presence of aortocoronary bypass graft: Secondary | ICD-10-CM | POA: Insufficient documentation

## 2012-12-19 DIAGNOSIS — I10 Essential (primary) hypertension: Secondary | ICD-10-CM | POA: Insufficient documentation

## 2012-12-19 DIAGNOSIS — R0602 Shortness of breath: Secondary | ICD-10-CM | POA: Insufficient documentation

## 2012-12-19 DIAGNOSIS — Z87891 Personal history of nicotine dependence: Secondary | ICD-10-CM | POA: Insufficient documentation

## 2012-12-19 DIAGNOSIS — R002 Palpitations: Secondary | ICD-10-CM | POA: Insufficient documentation

## 2012-12-19 DIAGNOSIS — I2510 Atherosclerotic heart disease of native coronary artery without angina pectoris: Secondary | ICD-10-CM

## 2012-12-19 DIAGNOSIS — I25810 Atherosclerosis of coronary artery bypass graft(s) without angina pectoris: Secondary | ICD-10-CM

## 2012-12-19 DIAGNOSIS — E785 Hyperlipidemia, unspecified: Secondary | ICD-10-CM | POA: Insufficient documentation

## 2012-12-19 DIAGNOSIS — R079 Chest pain, unspecified: Secondary | ICD-10-CM

## 2012-12-19 DIAGNOSIS — I4891 Unspecified atrial fibrillation: Secondary | ICD-10-CM

## 2012-12-19 DIAGNOSIS — R0789 Other chest pain: Secondary | ICD-10-CM | POA: Insufficient documentation

## 2012-12-19 MED ORDER — TECHNETIUM TC 99M SESTAMIBI GENERIC - CARDIOLITE
33.0000 | Freq: Once | INTRAVENOUS | Status: AC | PRN
  Administered 2012-12-19: 33 via INTRAVENOUS

## 2012-12-19 MED ORDER — REGADENOSON 0.4 MG/5ML IV SOLN
0.4000 mg | Freq: Once | INTRAVENOUS | Status: AC
  Administered 2012-12-19: 0.4 mg via INTRAVENOUS

## 2012-12-19 NOTE — Progress Notes (Signed)
[facility] 3 NUCLEAR MED [address] [place], [place] [address] [phone]    Cardiology Nuclear Med Study  Erica Hood is a 72 y.o. female     MRN : [id number]     DOB: 01-08-1941  Procedure Date: 12/19/2012  Nuclear Med Background Indication for Stress Test:  Evaluation for Ischemia, Graft Patency, Stent Patency and PTCA Patency History:  AFIB, Angioplasty, 11/2009 MI- AWMI-LAD,Heart Cath:Total occlusion RCA with collaterals CFX 40-60% LAD Stenosis Stents x3- CABG  12/2009 ECHO: mild LVH EF: EF: 55%, 04/2010 Pacemaker: SSS Cardiac Risk Factors: History of Smoking, Hypertension and Lipids  Symptoms:  Chest Tightness, Palpitations and SOB   Nuclear Pre-Procedure Caffeine/Decaff Intake:  None NPO After: 6:30 am   Lungs:  clear O2 Sat: 99% on room air. IV 0.9% NS with Angio Cath:  22g  IV Site: R Hand  IV Started by:  Milana Na, EMT-P  Chest Size (in):  44 Cup Size: DD  Height: 5\' 4"  (1.626 m)  Weight:  235 lb (106.595 kg)  BMI:  Body mass index is 40.32 kg/(m^2). Tech Comments:  No Metoprolol this am    Nuclear Med Study 1 or 2 day study: 2 day  Stress Test Type:  Lexiscan  Reading MD: Kristeen Miss, MD  Order Authorizing Provider:  Shawnie Pons  Resting Radionuclide: Technetium 33m Sestamibi  Resting Radionuclide Dose: 33.0 mCi on 12/20/12   Stress Radionuclide:  Technetium 45m Sestamibi  Stress Radionuclide Dose: 33.0 mCi on 12/19/12           Stress Protocol Rest HR: 63 Stress HR: 83  Rest BP: 158/64 Stress BP: 165/62  Exercise Time (min): n/a METS: n/a   Predicted Max HR: 151 bpm % Max HR: 54.97 bpm Rate Pressure Product: 21308   Dose of Adenosine (mg):  n/a Dose of Lexiscan: 0.4 mg  Dose of Atropine (mg): n/a Dose of Dobutamine: n/a mcg/kg/min (at max HR)  Stress Test Technologist: Milana Na, EMT-P  Nuclear Technologist:  Domenic Polite, CNMT     Rest Procedure:  Myocardial perfusion imaging was performed at rest 45 minutes  following the intravenous administration of Technetium 48m Sestamibi. Rest ECG: NSR. 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  Stress Procedure:  The patient received IV Lexiscan 0.4 mg over 15-seconds.  Technetium 62m Sestamibi injected at 30-seconds.  This patient was sob, felt weird, headache, and had chest fullness with Lexiscan. Quantitative spect images were obtained after a 45 minute delay. Stress ECG: No significant change from baseline ECG  QPS Raw Data Images:  Normal; no motion artifact; normal heart/lung ratio. Stress Images:  There is a medium sized, severe defect in the diastal anterior wall and apex.  The uptake in the other regions is relatively normal.      Rest Images:  There is a medium sized, severe defect in the diastal anterior wall and apex.  The uptake in the other regions is relatively normal. Subtraction (SDS):  No evidence of ischemia.  There is evidence of a previous anterior apical MI.  Transient Ischemic Dilatation (Normal <1.22):  1.08 Lung/Heart Ratio (Normal <0.45):  0.37  Quantitative Gated Spect Images QGS EDV:  80 ml QGS ESV:  38 ml  Impression Exercise Capacity:  Lexiscan with no exercise. BP Response:  Normal blood pressure response. Clinical Symptoms:  No significant symptoms noted. ECG Impression:  No significant ST segment change suggestive of ischemia. Comparison with Prior Nuclear Study: No images to compare  Overall Impression:  Low risk stress  nuclear study.   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  There are no areas of ischemia.  LV Ejection Fraction: 53%.  LV Wall Motion:  The apex is akinetic.  The other walls contract fairly well.    Erica Hood, Erica Hageman., MD, [facility] 12/20/2012, 4:09 PM Office - [phone] Pager [phone]

## 2012-12-20 ENCOUNTER — Encounter (HOSPITAL_COMMUNITY)
Admission: RE | Admit: 2012-12-20 | Discharge: 2012-12-20 | Disposition: A | Discharge status: Home / Self Care | Payer: Self-pay | Source: Ambulatory Visit | Attending: Cardiology | Admitting: Cardiology

## 2012-12-20 ENCOUNTER — Ambulatory Visit (HOSPITAL_COMMUNITY): Payer: Medicare Other | Attending: Cardiovascular Disease | Admitting: Radiology

## 2012-12-20 DIAGNOSIS — R0989 Other specified symptoms and signs involving the circulatory and respiratory systems: Secondary | ICD-10-CM

## 2012-12-20 MED ORDER — TECHNETIUM TC 99M SESTAMIBI GENERIC - CARDIOLITE
33.0000 | Freq: Once | INTRAVENOUS | Status: AC | PRN
  Administered 2012-12-20: 33 via INTRAVENOUS

## 2012-12-21 ENCOUNTER — Encounter (HOSPITAL_COMMUNITY)
Admission: RE | Admit: 2012-12-21 | Discharge: 2012-12-21 | Disposition: A | Discharge status: Home / Self Care | Payer: Self-pay | Source: Ambulatory Visit | Attending: Cardiology | Admitting: Cardiology

## 2012-12-22 ENCOUNTER — Encounter (HOSPITAL_COMMUNITY)
Admission: RE | Admit: 2012-12-22 | Discharge: 2012-12-22 | Disposition: A | Discharge status: Home / Self Care | Payer: Self-pay | Source: Ambulatory Visit | Attending: Cardiology | Admitting: Cardiology

## 2012-12-23 ENCOUNTER — Encounter: Payer: Self-pay | Admitting: Cardiology

## 2012-12-23 ENCOUNTER — Ambulatory Visit (INDEPENDENT_AMBULATORY_CARE_PROVIDER_SITE_OTHER): Payer: Medicare Other | Admitting: Cardiology

## 2012-12-23 VITALS — BP 164/80 | HR 58 | Ht 64.0 in | Wt 240.0 lb

## 2012-12-23 DIAGNOSIS — I2510 Atherosclerotic heart disease of native coronary artery without angina pectoris: Secondary | ICD-10-CM

## 2012-12-23 DIAGNOSIS — R079 Chest pain, unspecified: Secondary | ICD-10-CM

## 2012-12-23 DIAGNOSIS — R002 Palpitations: Secondary | ICD-10-CM

## 2012-12-23 DIAGNOSIS — J984 Other disorders of lung: Secondary | ICD-10-CM

## 2012-12-23 DIAGNOSIS — I4891 Unspecified atrial fibrillation: Secondary | ICD-10-CM

## 2012-12-23 NOTE — Progress Notes (Signed)
HPI:  Patient continues to have some of the symptoms. Fortunately, her myocardial perfusion scan was read as low risk scan and I went over this in detail with her today. There no new findings she is also been followed up regularly in the rehabilitation facility. Her blood pressure is a bit higher today, but she says that since been in the last few days. She feels his heart pulsations, although she denies that she think she is out of rhythm the present time. Her pacemaker was interrogated her last visit, and did not demonstrate any underlying atrial fibrillation. Overall, she is relatively stable.  Current Outpatient Prescriptions  Medication Sig Dispense Refill  . acetaminophen (TYLENOL) 500 MG tablet Take 500 mg by mouth as needed.        Marland Kitchen amLODipine (NORVASC) 10 MG tablet TAKE ONE TABLET BY MOUTH ONCE DAILY  90 tablet  0  . aspirin 81 MG tablet Take 81 mg by mouth daily.        Marland Kitchen HYDROcodone-acetaminophen (NORCO) 5-325 MG per tablet Take 1 tablet by mouth every 6 (six) hours as needed.      Marland Kitchen losartan (COZAAR) 50 MG tablet TAKE ONE TABLET BY MOUTH ONCE DAILY  90 tablet  3  . methocarbamol (ROBAXIN) 500 MG tablet Take 500 mg by mouth 3 (three) times daily as needed.      . metoprolol succinate (TOPROL-XL) 100 MG 24 hr tablet Take 1 tablet (100 mg total) by mouth daily.  90 tablet  2  . pantoprazole (PROTONIX) 40 MG tablet TAKE 1 TABLET BY MOUTH ONCE DAILY  90 tablet  3  . warfarin (COUMADIN) 2.5 MG tablet TAKE 1 TABLET BY MOUTH AS DIRECTED.  135 tablet  1   No current facility-administered medications for this visit.    No Known Allergies  Past Medical History  Diagnosis Date  . OBESITY   . CAD, ARTERY BYPASS GRAFT 11/2009  . LUNG NODULE     stable RLL nodule CT 04/2010 and 10/2010 and 03/2011  . 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 PAF (paroxysmal atrial fibrillation)     chronic anticoag  . MYOCARDIAL INFARCTION, ACUTE, ANTERIOR WALL 11/2009  . Hypertension   .  HYPERCHOLESTEROLEMIA  IIA   . Depression   . ARTHRITIS, CERVICAL SPINE   . Thyroid nodule 2011    neg bx 01/2010    Past Surgical History  Procedure Laterality Date  . Coronary artery bypass graft  11-2009  . Cholecystectomy    . Appendectomy    . Breast lumpectomy  1967    right breast (bening)  . Tonsillectomy    . Angioplasty      ans stenting of her LAD    Family History  Problem Relation Age of Onset  . Other      Significant for premature cardiovascular disease affecting her father in his 78's  . Alcohol abuse      family hx addiction (parent)  . Arthritis      parent  . Hypertension      parent and other relative  . Cervical cancer Mother   . Hypothyroidism Daughter     History   Social History  . Marital Status: Married    Spouse Name: N/A    Number of Children: N/A  . 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 Smoking  status: Former Smoker    Quit date: 11/02/2009  . Smokeless tobacco: Not on file  . Alcohol Use: No  . Drug Use: No  . Sexually Active: Not on file   Other Topics Concern  . Not on file   Social History Narrative  . No narrative on file    ROS: Please see the HPI.  All other systems reviewed and negative.  PHYSICAL EXAM:  BP 164/80  Pulse 58  Ht 5\' 4"  (1.626 m)  Wt 240 lb (108.863 kg)  BMI 41.18 kg/m2  SpO2 99%  General: delightful overweight female in no acute distress.   Head:  Normocephalic and atraumatic. Neck: no JVD Lungs: Clear to auscultation and percussion. Heart: Normal S1 and S2.  No murmur, rubs or gallops.  Abdomen:  Normal bowel sounds; soft; non tender; no organomegaly Pulses: Pulses normal in all 4 extremities. Extremities: No clubbing or cyanosis. No edema. Neurologic: Alert and oriented x 3.  EKG:  NSR.  Anterior T inversion, no def change from prior tracing.    ASSESSMENT AND PLAN:

## 2012-12-23 NOTE — Patient Instructions (Addendum)
Your physician recommends that you schedule a follow-up appointment in: 4 WEEKS  Your physician recommends that you continue on your current medications as directed. Please refer to the Current Medication list given to you today.  Your physician has recommended that you wear an event monitor. Event monitors are medical devices that record the heart's electrical activity. Doctors most often [place] these monitors to diagnose arrhythmias. Arrhythmias are problems with the speed or rhythm of the heartbeat. The monitor is a small, portable device. You can wear one while you do your normal daily activities. This is usually used to diagnose what is causing palpitations/syncope (passing out).

## 2012-12-23 NOTE — Assessment & Plan Note (Signed)
I am not quite sure the source of her symptoms. We will plan to have her back in followup approximately 3-4 weeks and reassess her situation. Her myocardial perfusion scan was read as low risk. She's not clearly in atrial fibrillation. Fortunately she is being followed closely in rehabilitation clinic as well. I will see her back and make any further decisions about further testing might need to be done.  We will put an event monitor on her to try to get a better correlation of symptoms and other things.

## 2012-12-25 NOTE — Assessment & Plan Note (Signed)
Her current symptoms due not suggest cardiac ischemia, and her scan is unremarkable.  We will continue to follow her closely at the present time.

## 2012-12-25 NOTE — Assessment & Plan Note (Signed)
See final CT report.

## 2012-12-26 ENCOUNTER — Ambulatory Visit (INDEPENDENT_AMBULATORY_CARE_PROVIDER_SITE_OTHER): Payer: Medicare Other

## 2012-12-26 DIAGNOSIS — I4891 Unspecified atrial fibrillation: Secondary | ICD-10-CM

## 2012-12-26 DIAGNOSIS — R002 Palpitations: Secondary | ICD-10-CM

## 2012-12-26 DIAGNOSIS — I2510 Atherosclerotic heart disease of native coronary artery without angina pectoris: Secondary | ICD-10-CM

## 2012-12-26 NOTE — Progress Notes (Signed)
Placed a event monitor and went over instructions on how to use it and when to return it 

## 2012-12-27 ENCOUNTER — Encounter: Payer: Self-pay | Admitting: Cardiology

## 2012-12-27 ENCOUNTER — Encounter (HOSPITAL_COMMUNITY)
Admission: RE | Admit: 2012-12-27 | Discharge: 2012-12-27 | Disposition: A | Discharge status: Home / Self Care | Payer: Self-pay | Source: Ambulatory Visit | Attending: Cardiology | Admitting: Cardiology

## 2012-12-28 ENCOUNTER — Encounter: Payer: Self-pay | Admitting: *Deleted

## 2012-12-28 ENCOUNTER — Encounter (HOSPITAL_COMMUNITY)
Admission: RE | Admit: 2012-12-28 | Discharge: 2012-12-28 | Disposition: A | Discharge status: Home / Self Care | Payer: Self-pay | Source: Ambulatory Visit | Attending: Cardiology | Admitting: Cardiology

## 2012-12-29 ENCOUNTER — Encounter (HOSPITAL_COMMUNITY)
Admission: RE | Admit: 2012-12-29 | Discharge: 2012-12-29 | Disposition: A | Discharge status: Home / Self Care | Payer: Self-pay | Source: Ambulatory Visit | Attending: Cardiology | Admitting: Cardiology

## 2013-01-02 ENCOUNTER — Telehealth: Payer: Self-pay | Admitting: Cardiology

## 2013-01-03 ENCOUNTER — Encounter (HOSPITAL_COMMUNITY): Payer: Self-pay

## 2013-01-04 ENCOUNTER — Encounter (HOSPITAL_COMMUNITY): Payer: Self-pay

## 2013-01-04 ENCOUNTER — Telehealth: Payer: Self-pay | Admitting: Cardiology

## 2013-01-04 DIAGNOSIS — Z9861 Coronary angioplasty status: Secondary | ICD-10-CM | POA: Insufficient documentation

## 2013-01-04 DIAGNOSIS — Z7982 Long term (current) use of aspirin: Secondary | ICD-10-CM | POA: Insufficient documentation

## 2013-01-04 DIAGNOSIS — I2582 Chronic total occlusion of coronary artery: Secondary | ICD-10-CM | POA: Insufficient documentation

## 2013-01-04 DIAGNOSIS — K219 Gastro-esophageal reflux disease without esophagitis: Secondary | ICD-10-CM | POA: Insufficient documentation

## 2013-01-04 DIAGNOSIS — I2510 Atherosclerotic heart disease of native coronary artery without angina pectoris: Secondary | ICD-10-CM | POA: Insufficient documentation

## 2013-01-04 DIAGNOSIS — Z7902 Long term (current) use of antithrombotics/antiplatelets: Secondary | ICD-10-CM | POA: Insufficient documentation

## 2013-01-04 DIAGNOSIS — I6529 Occlusion and stenosis of unspecified carotid artery: Secondary | ICD-10-CM | POA: Insufficient documentation

## 2013-01-04 DIAGNOSIS — F17200 Nicotine dependence, unspecified, uncomplicated: Secondary | ICD-10-CM | POA: Insufficient documentation

## 2013-01-04 DIAGNOSIS — Z951 Presence of aortocoronary bypass graft: Secondary | ICD-10-CM | POA: Insufficient documentation

## 2013-01-04 DIAGNOSIS — I651 Occlusion and stenosis of basilar artery: Secondary | ICD-10-CM | POA: Insufficient documentation

## 2013-01-04 DIAGNOSIS — I509 Heart failure, unspecified: Secondary | ICD-10-CM | POA: Insufficient documentation

## 2013-01-04 DIAGNOSIS — Z5189 Encounter for other specified aftercare: Secondary | ICD-10-CM | POA: Insufficient documentation

## 2013-01-04 DIAGNOSIS — E7800 Pure hypercholesterolemia, unspecified: Secondary | ICD-10-CM | POA: Insufficient documentation

## 2013-01-04 DIAGNOSIS — I4892 Unspecified atrial flutter: Secondary | ICD-10-CM | POA: Insufficient documentation

## 2013-01-04 DIAGNOSIS — E669 Obesity, unspecified: Secondary | ICD-10-CM | POA: Insufficient documentation

## 2013-01-04 DIAGNOSIS — I4891 Unspecified atrial fibrillation: Secondary | ICD-10-CM | POA: Insufficient documentation

## 2013-01-04 DIAGNOSIS — E785 Hyperlipidemia, unspecified: Secondary | ICD-10-CM | POA: Insufficient documentation

## 2013-01-04 DIAGNOSIS — I252 Old myocardial infarction: Secondary | ICD-10-CM | POA: Insufficient documentation

## 2013-01-04 DIAGNOSIS — Z8249 Family history of ischemic heart disease and other diseases of the circulatory system: Secondary | ICD-10-CM | POA: Insufficient documentation

## 2013-01-04 DIAGNOSIS — I2589 Other forms of chronic ischemic heart disease: Secondary | ICD-10-CM | POA: Insufficient documentation

## 2013-01-04 DIAGNOSIS — I10 Essential (primary) hypertension: Secondary | ICD-10-CM | POA: Insufficient documentation

## 2013-01-04 NOTE — Telephone Encounter (Signed)
A user error has taken place.

## 2013-01-04 NOTE — Telephone Encounter (Signed)
Error

## 2013-01-05 ENCOUNTER — Encounter (HOSPITAL_COMMUNITY)
Admission: RE | Admit: 2013-01-05 | Discharge: 2013-01-05 | Disposition: A | Discharge status: Home / Self Care | Payer: Self-pay | Source: Ambulatory Visit | Attending: Cardiology | Admitting: Cardiology

## 2013-01-10 ENCOUNTER — Encounter (HOSPITAL_COMMUNITY)
Admission: RE | Admit: 2013-01-10 | Discharge: 2013-01-10 | Disposition: A | Discharge status: Home / Self Care | Payer: Self-pay | Source: Ambulatory Visit | Attending: Cardiology | Admitting: Cardiology

## 2013-01-11 ENCOUNTER — Encounter: Payer: Self-pay | Admitting: Internal Medicine

## 2013-01-11 ENCOUNTER — Encounter (HOSPITAL_COMMUNITY)
Admission: RE | Admit: 2013-01-11 | Discharge: 2013-01-11 | Disposition: A | Discharge status: Home / Self Care | Payer: Self-pay | Source: Ambulatory Visit | Attending: Cardiology | Admitting: Cardiology

## 2013-01-12 ENCOUNTER — Encounter (HOSPITAL_COMMUNITY)
Admission: RE | Admit: 2013-01-12 | Discharge: 2013-01-12 | Disposition: A | Discharge status: Home / Self Care | Payer: Self-pay | Source: Ambulatory Visit | Attending: Cardiology | Admitting: Cardiology

## 2013-01-17 ENCOUNTER — Encounter (HOSPITAL_COMMUNITY): Payer: Self-pay

## 2013-01-18 ENCOUNTER — Encounter (HOSPITAL_COMMUNITY)
Admission: RE | Admit: 2013-01-18 | Discharge: 2013-01-18 | Disposition: A | Discharge status: Home / Self Care | Payer: Self-pay | Source: Ambulatory Visit | Attending: Cardiology | Admitting: Cardiology

## 2013-01-19 ENCOUNTER — Encounter (HOSPITAL_COMMUNITY): Payer: Self-pay

## 2013-01-19 ENCOUNTER — Ambulatory Visit (INDEPENDENT_AMBULATORY_CARE_PROVIDER_SITE_OTHER): Payer: Medicare Other | Admitting: Pharmacist

## 2013-01-19 DIAGNOSIS — I4891 Unspecified atrial fibrillation: Secondary | ICD-10-CM

## 2013-01-19 LAB — POCT INR: INR: 2.2

## 2013-01-20 ENCOUNTER — Ambulatory Visit (INDEPENDENT_AMBULATORY_CARE_PROVIDER_SITE_OTHER): Payer: Medicare Other | Admitting: Cardiology

## 2013-01-20 ENCOUNTER — Encounter: Payer: Self-pay | Admitting: Cardiology

## 2013-01-20 VITALS — BP 138/74 | HR 60 | Ht 64.0 in | Wt 238.0 lb

## 2013-01-20 DIAGNOSIS — R002 Palpitations: Secondary | ICD-10-CM

## 2013-01-20 DIAGNOSIS — I2510 Atherosclerotic heart disease of native coronary artery without angina pectoris: Secondary | ICD-10-CM

## 2013-01-20 DIAGNOSIS — I4891 Unspecified atrial fibrillation: Secondary | ICD-10-CM

## 2013-01-20 DIAGNOSIS — J984 Other disorders of lung: Secondary | ICD-10-CM

## 2013-01-20 DIAGNOSIS — I10 Essential (primary) hypertension: Secondary | ICD-10-CM

## 2013-01-20 NOTE — Patient Instructions (Addendum)
Your physician recommends that you schedule a follow-up appointment in: 2-3 MONTHS with Dr Excell Seltzer (previous pt of Dr Riley Kill)  Your physician recommends that you continue on your current medications as directed. Please refer to the Current Medication list given to you today.

## 2013-01-20 NOTE — Progress Notes (Signed)
HPI:  The patient returns for follow up.  She was doing well. She still feels the palpitations that she had previously. Notably, she and I went through each strip and her monitor, and a I showed her  some examples. She has several episodes of 3 beats of wide complex tachycardia. She has a couple of short runs of a supraventricular rhythm although I cannot exclude the possibility that this actually represents sinus tachycardia with movement she has occasional VPB's. No prolonged sustained arrhythmias are present.  She denies syncope or presyncope.    Current Outpatient Prescriptions  Medication Sig Dispense Refill  . acetaminophen (TYLENOL) 500 MG tablet Take 500 mg by mouth as needed.        Marland Kitchen amLODipine (NORVASC) 10 MG tablet TAKE ONE TABLET BY MOUTH ONCE DAILY  90 tablet  0  . aspirin 81 MG tablet Take 81 mg by mouth daily.        Marland Kitchen HYDROcodone-acetaminophen (NORCO) 5-325 MG per tablet Take 1 tablet by mouth every 6 (six) hours as needed.      Marland Kitchen losartan (COZAAR) 50 MG tablet TAKE ONE TABLET BY MOUTH ONCE DAILY  90 tablet  3  . methocarbamol (ROBAXIN) 500 MG tablet Take 500 mg by mouth 3 (three) times daily as needed.      . metoprolol succinate (TOPROL-XL) 100 MG 24 hr tablet Take 1 tablet (100 mg total) by mouth daily.  90 tablet  2  . pantoprazole (PROTONIX) 40 MG tablet TAKE 1 TABLET BY MOUTH ONCE DAILY  90 tablet  3  . warfarin (COUMADIN) 2.5 MG tablet TAKE 1 TABLET BY MOUTH AS DIRECTED.  135 tablet  1   No current facility-administered medications for this visit.    No Known Allergies  Past Medical History  Diagnosis Date  . OBESITY   . CAD, ARTERY BYPASS GRAFT 11/2009  . LUNG NODULE     stable RLL nodule CT 04/2010 and 10/2010 and 03/2011  . 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 PAF (paroxysmal atrial fibrillation)     chronic anticoag  . MYOCARDIAL INFARCTION, ACUTE, ANTERIOR WALL 11/2009  . Hypertension   . HYPERCHOLESTEROLEMIA  IIA   . Depression   .  ARTHRITIS, CERVICAL SPINE   . Thyroid nodule 2011    neg bx 01/2010    Past Surgical History  Procedure Laterality Date  . Coronary artery bypass graft  11-2009  . Cholecystectomy    . Appendectomy    . Breast lumpectomy  1967    right breast (bening)  . Tonsillectomy    . Angioplasty      ans stenting of her LAD    Family History  Problem Relation Age of Onset  . Other      Significant for premature cardiovascular disease affecting her father in his 40's  . Alcohol abuse      family hx addiction (parent)  . Arthritis      parent  . Hypertension      parent and other relative  . Cervical cancer Mother   . Hypothyroidism Daughter     History   Social History  . Marital Status: Married    Spouse Name: N/A    Number of Children: N/A  . 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 Smoking status: Former Smoker    Quit date: 11/02/2009  .  Smokeless tobacco: Not on file  . Alcohol Use: No  . Drug Use: No  . Sexually Active: Not on file   Other Topics Concern  . Not on file   Social History Narrative  . No narrative on file    ROS: Please see the HPI.  All other systems reviewed and negative.  PHYSICAL EXAM:  BP 138/74  Pulse 60  Ht 5\' 4"  (1.626 m)  Wt 238 lb (107.956 kg)  BMI 40.83 kg/m2  SpO2 96%  General: Well developed, well nourished, in no acute distress. Head:  Normocephalic and atraumatic. Neck: no JVD Lungs: Clear to auscultation and percussion.  Pacer site looks good.   Heart: Normal S1 and S2.  No murmur, rubs or gallops.  Abdomen:  Normal bowel sounds; soft; non tender; no organomegaly Pulses: Pulses normal in all 4 extremities. Extremities: No clubbing or cyanosis. No edema. Neurologic: Alert and oriented x 3.  EKG:  Electronic atrial pacer.  Last EF 55%    ASSESSMENT AND PLAN:  1.  Coronary artery disease 2.  Tachypalpitations --  See event monitor. 3.   History of atrial fib.    Continue current medications.

## 2013-01-24 ENCOUNTER — Encounter (HOSPITAL_COMMUNITY): Payer: Self-pay

## 2013-01-24 NOTE — Assessment & Plan Note (Signed)
Post CABG.  No current angina.  Will continue to monitor in follow up.

## 2013-01-24 NOTE — Assessment & Plan Note (Signed)
CT was followed to resolution.

## 2013-01-24 NOTE — Assessment & Plan Note (Signed)
This is currently controlled. 

## 2013-01-24 NOTE — Assessment & Plan Note (Addendum)
Not sure of etiology, but likely multifactorial.  She is on warfarin for a fib prophylaxis.

## 2013-01-25 ENCOUNTER — Encounter (HOSPITAL_COMMUNITY): Payer: Self-pay

## 2013-01-26 ENCOUNTER — Encounter (HOSPITAL_COMMUNITY): Payer: Self-pay

## 2013-01-31 ENCOUNTER — Encounter (HOSPITAL_COMMUNITY): Payer: Self-pay

## 2013-01-31 DIAGNOSIS — E7800 Pure hypercholesterolemia, unspecified: Secondary | ICD-10-CM | POA: Insufficient documentation

## 2013-01-31 DIAGNOSIS — I4891 Unspecified atrial fibrillation: Secondary | ICD-10-CM | POA: Insufficient documentation

## 2013-01-31 DIAGNOSIS — I2582 Chronic total occlusion of coronary artery: Secondary | ICD-10-CM | POA: Insufficient documentation

## 2013-01-31 DIAGNOSIS — Z7902 Long term (current) use of antithrombotics/antiplatelets: Secondary | ICD-10-CM | POA: Insufficient documentation

## 2013-01-31 DIAGNOSIS — I2510 Atherosclerotic heart disease of native coronary artery without angina pectoris: Secondary | ICD-10-CM | POA: Insufficient documentation

## 2013-01-31 DIAGNOSIS — Z5189 Encounter for other specified aftercare: Secondary | ICD-10-CM | POA: Insufficient documentation

## 2013-01-31 DIAGNOSIS — K219 Gastro-esophageal reflux disease without esophagitis: Secondary | ICD-10-CM | POA: Insufficient documentation

## 2013-01-31 DIAGNOSIS — E669 Obesity, unspecified: Secondary | ICD-10-CM | POA: Insufficient documentation

## 2013-01-31 DIAGNOSIS — Z951 Presence of aortocoronary bypass graft: Secondary | ICD-10-CM | POA: Insufficient documentation

## 2013-01-31 DIAGNOSIS — I10 Essential (primary) hypertension: Secondary | ICD-10-CM | POA: Insufficient documentation

## 2013-01-31 DIAGNOSIS — Z9861 Coronary angioplasty status: Secondary | ICD-10-CM | POA: Insufficient documentation

## 2013-01-31 DIAGNOSIS — Z7982 Long term (current) use of aspirin: Secondary | ICD-10-CM | POA: Insufficient documentation

## 2013-01-31 DIAGNOSIS — F17200 Nicotine dependence, unspecified, uncomplicated: Secondary | ICD-10-CM | POA: Insufficient documentation

## 2013-01-31 DIAGNOSIS — I252 Old myocardial infarction: Secondary | ICD-10-CM | POA: Insufficient documentation

## 2013-01-31 DIAGNOSIS — Z8249 Family history of ischemic heart disease and other diseases of the circulatory system: Secondary | ICD-10-CM | POA: Insufficient documentation

## 2013-01-31 DIAGNOSIS — I6529 Occlusion and stenosis of unspecified carotid artery: Secondary | ICD-10-CM | POA: Insufficient documentation

## 2013-01-31 DIAGNOSIS — I509 Heart failure, unspecified: Secondary | ICD-10-CM | POA: Insufficient documentation

## 2013-01-31 DIAGNOSIS — E785 Hyperlipidemia, unspecified: Secondary | ICD-10-CM | POA: Insufficient documentation

## 2013-01-31 DIAGNOSIS — I651 Occlusion and stenosis of basilar artery: Secondary | ICD-10-CM | POA: Insufficient documentation

## 2013-01-31 DIAGNOSIS — I4892 Unspecified atrial flutter: Secondary | ICD-10-CM | POA: Insufficient documentation

## 2013-01-31 DIAGNOSIS — I2589 Other forms of chronic ischemic heart disease: Secondary | ICD-10-CM | POA: Insufficient documentation

## 2013-02-01 ENCOUNTER — Encounter (HOSPITAL_COMMUNITY): Payer: Self-pay

## 2013-02-02 ENCOUNTER — Encounter (HOSPITAL_COMMUNITY)
Admission: RE | Admit: 2013-02-02 | Discharge: 2013-02-02 | Disposition: A | Discharge status: Home / Self Care | Payer: Self-pay | Source: Ambulatory Visit | Attending: Cardiology | Admitting: Cardiology

## 2013-02-07 ENCOUNTER — Encounter (HOSPITAL_COMMUNITY)
Admission: RE | Admit: 2013-02-07 | Discharge: 2013-02-07 | Disposition: A | Discharge status: Home / Self Care | Payer: Self-pay | Source: Ambulatory Visit | Attending: Cardiology | Admitting: Cardiology

## 2013-02-08 ENCOUNTER — Encounter (HOSPITAL_COMMUNITY)
Admission: RE | Admit: 2013-02-08 | Discharge: 2013-02-08 | Disposition: A | Discharge status: Home / Self Care | Payer: Self-pay | Source: Ambulatory Visit | Attending: Cardiology | Admitting: Cardiology

## 2013-02-09 ENCOUNTER — Encounter (HOSPITAL_COMMUNITY)
Admission: RE | Admit: 2013-02-09 | Discharge: 2013-02-09 | Disposition: A | Discharge status: Home / Self Care | Payer: Self-pay | Source: Ambulatory Visit | Attending: Cardiology | Admitting: Cardiology

## 2013-02-14 ENCOUNTER — Encounter (HOSPITAL_COMMUNITY)
Admission: RE | Admit: 2013-02-14 | Discharge: 2013-02-14 | Disposition: A | Discharge status: Home / Self Care | Payer: Self-pay | Source: Ambulatory Visit | Attending: Cardiology | Admitting: Cardiology

## 2013-02-15 ENCOUNTER — Encounter (HOSPITAL_COMMUNITY)
Admission: RE | Admit: 2013-02-15 | Discharge: 2013-02-15 | Disposition: A | Discharge status: Home / Self Care | Payer: Self-pay | Source: Ambulatory Visit | Attending: Cardiology | Admitting: Cardiology

## 2013-02-16 ENCOUNTER — Encounter (HOSPITAL_COMMUNITY)
Admission: RE | Admit: 2013-02-16 | Discharge: 2013-02-16 | Disposition: A | Discharge status: Home / Self Care | Payer: Self-pay | Source: Ambulatory Visit | Attending: Cardiology | Admitting: Cardiology

## 2013-02-21 ENCOUNTER — Encounter (HOSPITAL_COMMUNITY)
Admission: RE | Admit: 2013-02-21 | Discharge: 2013-02-21 | Disposition: A | Discharge status: Home / Self Care | Payer: Self-pay | Source: Ambulatory Visit | Attending: Cardiology | Admitting: Cardiology

## 2013-02-22 ENCOUNTER — Encounter (HOSPITAL_COMMUNITY)
Admission: RE | Admit: 2013-02-22 | Discharge: 2013-02-22 | Disposition: A | Discharge status: Home / Self Care | Payer: Self-pay | Source: Ambulatory Visit | Attending: Cardiology | Admitting: Cardiology

## 2013-02-23 ENCOUNTER — Encounter (HOSPITAL_COMMUNITY): Payer: Self-pay

## 2013-02-28 ENCOUNTER — Encounter (HOSPITAL_COMMUNITY)
Admission: RE | Admit: 2013-02-28 | Discharge: 2013-02-28 | Disposition: A | Discharge status: Home / Self Care | Payer: Self-pay | Source: Ambulatory Visit | Attending: Cardiology | Admitting: Cardiology

## 2013-03-01 ENCOUNTER — Encounter (HOSPITAL_COMMUNITY)
Admission: RE | Admit: 2013-03-01 | Discharge: 2013-03-01 | Disposition: A | Discharge status: Home / Self Care | Payer: Self-pay | Source: Ambulatory Visit | Attending: Cardiology | Admitting: Cardiology

## 2013-03-02 ENCOUNTER — Encounter (HOSPITAL_COMMUNITY): Payer: Self-pay

## 2013-03-02 DIAGNOSIS — K219 Gastro-esophageal reflux disease without esophagitis: Secondary | ICD-10-CM | POA: Insufficient documentation

## 2013-03-02 DIAGNOSIS — I2582 Chronic total occlusion of coronary artery: Secondary | ICD-10-CM | POA: Insufficient documentation

## 2013-03-02 DIAGNOSIS — I6529 Occlusion and stenosis of unspecified carotid artery: Secondary | ICD-10-CM | POA: Insufficient documentation

## 2013-03-02 DIAGNOSIS — I651 Occlusion and stenosis of basilar artery: Secondary | ICD-10-CM | POA: Insufficient documentation

## 2013-03-02 DIAGNOSIS — I2510 Atherosclerotic heart disease of native coronary artery without angina pectoris: Secondary | ICD-10-CM | POA: Insufficient documentation

## 2013-03-02 DIAGNOSIS — Z5189 Encounter for other specified aftercare: Secondary | ICD-10-CM | POA: Insufficient documentation

## 2013-03-02 DIAGNOSIS — I4891 Unspecified atrial fibrillation: Secondary | ICD-10-CM | POA: Insufficient documentation

## 2013-03-02 DIAGNOSIS — I252 Old myocardial infarction: Secondary | ICD-10-CM | POA: Insufficient documentation

## 2013-03-02 DIAGNOSIS — I2589 Other forms of chronic ischemic heart disease: Secondary | ICD-10-CM | POA: Insufficient documentation

## 2013-03-02 DIAGNOSIS — I4892 Unspecified atrial flutter: Secondary | ICD-10-CM | POA: Insufficient documentation

## 2013-03-02 DIAGNOSIS — Z7982 Long term (current) use of aspirin: Secondary | ICD-10-CM | POA: Insufficient documentation

## 2013-03-02 DIAGNOSIS — Z951 Presence of aortocoronary bypass graft: Secondary | ICD-10-CM | POA: Insufficient documentation

## 2013-03-02 DIAGNOSIS — Z9861 Coronary angioplasty status: Secondary | ICD-10-CM | POA: Insufficient documentation

## 2013-03-02 DIAGNOSIS — Z8249 Family history of ischemic heart disease and other diseases of the circulatory system: Secondary | ICD-10-CM | POA: Insufficient documentation

## 2013-03-02 DIAGNOSIS — Z7902 Long term (current) use of antithrombotics/antiplatelets: Secondary | ICD-10-CM | POA: Insufficient documentation

## 2013-03-02 DIAGNOSIS — E7800 Pure hypercholesterolemia, unspecified: Secondary | ICD-10-CM | POA: Insufficient documentation

## 2013-03-02 DIAGNOSIS — I10 Essential (primary) hypertension: Secondary | ICD-10-CM | POA: Insufficient documentation

## 2013-03-02 DIAGNOSIS — I509 Heart failure, unspecified: Secondary | ICD-10-CM | POA: Insufficient documentation

## 2013-03-02 DIAGNOSIS — F17200 Nicotine dependence, unspecified, uncomplicated: Secondary | ICD-10-CM | POA: Insufficient documentation

## 2013-03-02 DIAGNOSIS — E669 Obesity, unspecified: Secondary | ICD-10-CM | POA: Insufficient documentation

## 2013-03-02 DIAGNOSIS — E785 Hyperlipidemia, unspecified: Secondary | ICD-10-CM | POA: Insufficient documentation

## 2013-03-07 ENCOUNTER — Encounter (HOSPITAL_COMMUNITY)
Admission: RE | Admit: 2013-03-07 | Discharge: 2013-03-07 | Disposition: A | Discharge status: Home / Self Care | Payer: Self-pay | Source: Ambulatory Visit | Attending: Cardiology | Admitting: Cardiology

## 2013-03-08 ENCOUNTER — Encounter (HOSPITAL_COMMUNITY): Payer: Self-pay

## 2013-03-09 ENCOUNTER — Encounter (HOSPITAL_COMMUNITY): Payer: Self-pay

## 2013-03-14 ENCOUNTER — Encounter (HOSPITAL_COMMUNITY)
Admission: RE | Admit: 2013-03-14 | Discharge: 2013-03-14 | Disposition: A | Discharge status: Home / Self Care | Payer: Self-pay | Source: Ambulatory Visit | Attending: Cardiology | Admitting: Cardiology

## 2013-03-15 ENCOUNTER — Encounter (HOSPITAL_COMMUNITY): Payer: Self-pay

## 2013-03-16 ENCOUNTER — Other Ambulatory Visit: Payer: Self-pay | Admitting: Nurse Practitioner

## 2013-03-16 ENCOUNTER — Encounter (HOSPITAL_COMMUNITY)
Admission: RE | Admit: 2013-03-16 | Discharge: 2013-03-16 | Disposition: A | Discharge status: Home / Self Care | Payer: Self-pay | Source: Ambulatory Visit | Attending: Cardiology | Admitting: Cardiology

## 2013-03-21 ENCOUNTER — Encounter (HOSPITAL_COMMUNITY)
Admission: RE | Admit: 2013-03-21 | Discharge: 2013-03-21 | Disposition: A | Discharge status: Home / Self Care | Payer: Self-pay | Source: Ambulatory Visit | Attending: Cardiology | Admitting: Cardiology

## 2013-03-22 ENCOUNTER — Encounter (HOSPITAL_COMMUNITY)
Admission: RE | Admit: 2013-03-22 | Discharge: 2013-03-22 | Disposition: A | Discharge status: Home / Self Care | Payer: Self-pay | Source: Ambulatory Visit | Attending: Cardiology | Admitting: Cardiology

## 2013-03-23 ENCOUNTER — Encounter (HOSPITAL_COMMUNITY): Payer: Self-pay

## 2013-03-28 ENCOUNTER — Encounter (HOSPITAL_COMMUNITY)
Admission: RE | Admit: 2013-03-28 | Discharge: 2013-03-28 | Disposition: A | Discharge status: Home / Self Care | Payer: Self-pay | Source: Ambulatory Visit | Attending: Cardiology | Admitting: Cardiology

## 2013-03-29 ENCOUNTER — Encounter (HOSPITAL_COMMUNITY)
Admission: RE | Admit: 2013-03-29 | Discharge: 2013-03-29 | Disposition: A | Discharge status: Home / Self Care | Payer: Self-pay | Source: Ambulatory Visit | Attending: Cardiology | Admitting: Cardiology

## 2013-03-30 ENCOUNTER — Ambulatory Visit (INDEPENDENT_AMBULATORY_CARE_PROVIDER_SITE_OTHER): Payer: Medicare Other | Admitting: Pharmacist

## 2013-03-30 ENCOUNTER — Encounter (HOSPITAL_COMMUNITY)
Admission: RE | Admit: 2013-03-30 | Discharge: 2013-03-30 | Disposition: A | Discharge status: Home / Self Care | Payer: Self-pay | Source: Ambulatory Visit | Attending: Cardiology | Admitting: Cardiology

## 2013-03-30 DIAGNOSIS — I4891 Unspecified atrial fibrillation: Secondary | ICD-10-CM

## 2013-03-30 LAB — POCT INR: INR: 2.7

## 2013-04-04 ENCOUNTER — Encounter (HOSPITAL_COMMUNITY)
Admission: RE | Admit: 2013-04-04 | Discharge: 2013-04-04 | Disposition: A | Discharge status: Home / Self Care | Payer: Self-pay | Source: Ambulatory Visit | Attending: Cardiology | Admitting: Cardiology

## 2013-04-04 DIAGNOSIS — I651 Occlusion and stenosis of basilar artery: Secondary | ICD-10-CM | POA: Insufficient documentation

## 2013-04-04 DIAGNOSIS — I252 Old myocardial infarction: Secondary | ICD-10-CM | POA: Insufficient documentation

## 2013-04-04 DIAGNOSIS — E669 Obesity, unspecified: Secondary | ICD-10-CM | POA: Insufficient documentation

## 2013-04-04 DIAGNOSIS — I4891 Unspecified atrial fibrillation: Secondary | ICD-10-CM | POA: Insufficient documentation

## 2013-04-04 DIAGNOSIS — Z8249 Family history of ischemic heart disease and other diseases of the circulatory system: Secondary | ICD-10-CM | POA: Insufficient documentation

## 2013-04-04 DIAGNOSIS — Z7982 Long term (current) use of aspirin: Secondary | ICD-10-CM | POA: Insufficient documentation

## 2013-04-04 DIAGNOSIS — I509 Heart failure, unspecified: Secondary | ICD-10-CM | POA: Insufficient documentation

## 2013-04-04 DIAGNOSIS — E785 Hyperlipidemia, unspecified: Secondary | ICD-10-CM | POA: Insufficient documentation

## 2013-04-04 DIAGNOSIS — I2589 Other forms of chronic ischemic heart disease: Secondary | ICD-10-CM | POA: Insufficient documentation

## 2013-04-04 DIAGNOSIS — Z9861 Coronary angioplasty status: Secondary | ICD-10-CM | POA: Insufficient documentation

## 2013-04-04 DIAGNOSIS — Z951 Presence of aortocoronary bypass graft: Secondary | ICD-10-CM | POA: Insufficient documentation

## 2013-04-04 DIAGNOSIS — Z7902 Long term (current) use of antithrombotics/antiplatelets: Secondary | ICD-10-CM | POA: Insufficient documentation

## 2013-04-04 DIAGNOSIS — E7800 Pure hypercholesterolemia, unspecified: Secondary | ICD-10-CM | POA: Insufficient documentation

## 2013-04-04 DIAGNOSIS — I6529 Occlusion and stenosis of unspecified carotid artery: Secondary | ICD-10-CM | POA: Insufficient documentation

## 2013-04-04 DIAGNOSIS — I2582 Chronic total occlusion of coronary artery: Secondary | ICD-10-CM | POA: Insufficient documentation

## 2013-04-04 DIAGNOSIS — I2510 Atherosclerotic heart disease of native coronary artery without angina pectoris: Secondary | ICD-10-CM | POA: Insufficient documentation

## 2013-04-04 DIAGNOSIS — K219 Gastro-esophageal reflux disease without esophagitis: Secondary | ICD-10-CM | POA: Insufficient documentation

## 2013-04-04 DIAGNOSIS — I4892 Unspecified atrial flutter: Secondary | ICD-10-CM | POA: Insufficient documentation

## 2013-04-04 DIAGNOSIS — I10 Essential (primary) hypertension: Secondary | ICD-10-CM | POA: Insufficient documentation

## 2013-04-04 DIAGNOSIS — Z5189 Encounter for other specified aftercare: Secondary | ICD-10-CM | POA: Insufficient documentation

## 2013-04-04 DIAGNOSIS — F17200 Nicotine dependence, unspecified, uncomplicated: Secondary | ICD-10-CM | POA: Insufficient documentation

## 2013-04-05 ENCOUNTER — Encounter (HOSPITAL_COMMUNITY)
Admission: RE | Admit: 2013-04-05 | Discharge: 2013-04-05 | Disposition: A | Discharge status: Home / Self Care | Payer: Self-pay | Source: Ambulatory Visit | Attending: Cardiology | Admitting: Cardiology

## 2013-04-06 ENCOUNTER — Encounter (HOSPITAL_COMMUNITY)
Admission: RE | Admit: 2013-04-06 | Discharge: 2013-04-06 | Disposition: A | Discharge status: Home / Self Care | Payer: Self-pay | Source: Ambulatory Visit | Attending: Cardiology | Admitting: Cardiology

## 2013-04-11 ENCOUNTER — Encounter: Payer: Self-pay | Admitting: Cardiovascular Disease

## 2013-04-11 ENCOUNTER — Ambulatory Visit (INDEPENDENT_AMBULATORY_CARE_PROVIDER_SITE_OTHER): Payer: Medicare Other | Admitting: Cardiovascular Disease

## 2013-04-11 ENCOUNTER — Encounter (HOSPITAL_COMMUNITY): Payer: Self-pay

## 2013-04-11 VITALS — BP 166/98 | HR 64 | Ht 64.0 in | Wt 236.8 lb

## 2013-04-11 DIAGNOSIS — I2510 Atherosclerotic heart disease of native coronary artery without angina pectoris: Secondary | ICD-10-CM

## 2013-04-11 DIAGNOSIS — I4891 Unspecified atrial fibrillation: Secondary | ICD-10-CM

## 2013-04-11 NOTE — Patient Instructions (Addendum)
Your physician wants you to follow-up in: December 2014 with Dr Excell Seltzer.  You will receive a reminder letter in the mail two months in advance. If you don't receive a letter, please call our office to schedule the follow-up appointment.  Your physician recommends that you continue on your current medications as directed. Please refer to the Current Medication list given to you today.

## 2013-04-11 NOTE — Progress Notes (Signed)
HPI:  72 year old woman presenting for followup evaluation. She's been followed by Dr. Riley Kill. She has a history of coronary artery disease status post CABG and paroxysmal atrial fibrillation. She is maintained on long-term warfarin. She initially presented in 2011 with an acute anterior wall MI. She was treated with drug-eluting stents in the LAD but had severe residual multivessel disease and was ultimately treated with CABG. She required permanent pacemaker insertion. An echocardiogram done a few months after her CABG showed normal left ventricular systolic function.  She's been limited by back problems. She's going to have an injection in the near future. She's had some mild weakness in her legs. She denies chest pain or pressure, dyspnea, orthopnea, or PND. She does have mild leg swelling. She has no history of stroke or TIA. She's had no bleeding problems on warfarin.  Outpatient Encounter Prescriptions as of 04/11/2013  Medication Sig Dispense Refill  . acetaminophen (TYLENOL) 500 MG tablet Take 500 mg by mouth as needed.        Marland Kitchen amLODipine (NORVASC) 10 MG tablet TAKE 1 TABLET BY MOUTH ONCE DAILY  90 tablet  1  . aspirin 81 MG tablet Take 81 mg by mouth daily.        Marland Kitchen HYDROcodone-acetaminophen (NORCO) 5-325 MG per tablet Take 1 tablet by mouth every 6 (six) hours as needed.      Marland Kitchen losartan (COZAAR) 50 MG tablet TAKE ONE TABLET BY MOUTH ONCE DAILY  90 tablet  3  . methocarbamol (ROBAXIN) 500 MG tablet Take 500 mg by mouth 3 (three) times daily as needed.      . metoprolol succinate (TOPROL-XL) 100 MG 24 hr tablet Take 1 tablet (100 mg total) by mouth daily.  90 tablet  2  . pantoprazole (PROTONIX) 40 MG tablet TAKE 1 TABLET BY MOUTH ONCE DAILY  90 tablet  3  . warfarin (COUMADIN) 2.5 MG tablet TAKE 1 TABLET BY MOUTH AS DIRECTED.  135 tablet  1   No facility-administered encounter medications on file as of 04/11/2013.    No Known Allergies  Past Medical History  Diagnosis Date  .  OBESITY   . CAD, ARTERY BYPASS GRAFT 11/2009  . LUNG NODULE     stable RLL nodule CT 04/2010 and 10/2010 and 03/2011  . 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 PAF (paroxysmal atrial fibrillation)     chronic anticoag  . MYOCARDIAL INFARCTION, ACUTE, ANTERIOR WALL 11/2009  . Hypertension   . HYPERCHOLESTEROLEMIA  IIA   . Depression   . ARTHRITIS, CERVICAL SPINE   . Thyroid nodule 2011    neg bx 01/2010    ROS: Negative except as per HPI  BP 166/98  Pulse 64  Ht 5\' 4"  (1.626 m)  Wt 107.412 kg (236 lb 12.8 oz)  BMI 40.63 kg/m2  SpO2 98%  PHYSICAL EXAM: Pt is alert and oriented, pleasant obese woman in NAD HEENT: normal Neck: JVP - normal, carotids 2+= without bruits Lungs: CTA bilaterally CV: RRR without murmur or gallop Abd: soft, NT, Positive BS, no hepatomegaly Ext: 1+ ankle edema bilaterally, distal pulses intact and equal Skin: warm/dry no rash  ASSESSMENT AND PLAN: 1. Coronary artery disease status post CABG. Patient is stable without anginal symptoms. Her medications were reviewed and they are appropriate. She will continue the same program and I will see her back in 6 months.  2. Paroxysmal atrial fibrillation. She's had a few palpitations on an episodic basis. He's  had no prolonged episodes. She underwent an event recorder in the past year. She'll continue on warfarin. It is acceptable that she hold warfarin for 5-7 days prior to her back injection and she can restart it once the injection has been given.  3. Hypertension. She reports good blood pressure at home with readings in the 130s over 70s. She has whitecoat hypertension. She'll continue amlodipine, losartan, and metoprolol succinate.  Tonny Bollman 04/11/2013 11:47 AM

## 2013-04-12 ENCOUNTER — Encounter (HOSPITAL_COMMUNITY): Payer: Self-pay

## 2013-04-13 ENCOUNTER — Encounter (HOSPITAL_COMMUNITY): Payer: Self-pay

## 2013-04-18 ENCOUNTER — Encounter (HOSPITAL_COMMUNITY)
Admission: RE | Admit: 2013-04-18 | Discharge: 2013-04-18 | Disposition: A | Discharge status: Home / Self Care | Payer: Self-pay | Source: Ambulatory Visit | Attending: Cardiology | Admitting: Cardiology

## 2013-04-19 ENCOUNTER — Encounter (HOSPITAL_COMMUNITY)
Admission: RE | Admit: 2013-04-19 | Discharge: 2013-04-19 | Disposition: A | Discharge status: Home / Self Care | Payer: Self-pay | Source: Ambulatory Visit | Attending: Cardiology | Admitting: Cardiology

## 2013-04-20 ENCOUNTER — Encounter (HOSPITAL_COMMUNITY)
Admission: RE | Admit: 2013-04-20 | Discharge: 2013-04-20 | Disposition: A | Discharge status: Home / Self Care | Payer: Self-pay | Source: Ambulatory Visit | Attending: Cardiology | Admitting: Cardiology

## 2013-04-25 ENCOUNTER — Encounter (HOSPITAL_COMMUNITY): Payer: Self-pay

## 2013-04-26 ENCOUNTER — Other Ambulatory Visit: Payer: Self-pay | Admitting: Internal Medicine

## 2013-04-26 ENCOUNTER — Encounter (HOSPITAL_COMMUNITY)
Admission: RE | Admit: 2013-04-26 | Discharge: 2013-04-26 | Disposition: A | Discharge status: Home / Self Care | Payer: Self-pay | Source: Ambulatory Visit | Attending: Cardiology | Admitting: Cardiology

## 2013-04-26 ENCOUNTER — Ambulatory Visit (INDEPENDENT_AMBULATORY_CARE_PROVIDER_SITE_OTHER): Payer: Medicare Other | Admitting: Internal Medicine

## 2013-04-26 ENCOUNTER — Encounter: Payer: Self-pay | Admitting: Internal Medicine

## 2013-04-26 VITALS — BP 148/86 | Wt 238.4 lb

## 2013-04-26 DIAGNOSIS — I4891 Unspecified atrial fibrillation: Secondary | ICD-10-CM

## 2013-04-26 DIAGNOSIS — Z950 Presence of cardiac pacemaker: Secondary | ICD-10-CM

## 2013-04-26 DIAGNOSIS — I10 Essential (primary) hypertension: Secondary | ICD-10-CM

## 2013-04-26 NOTE — Assessment & Plan Note (Signed)
She will continue her current medical therapy as she is maintaining sinus rhythm very nicely.

## 2013-04-26 NOTE — Assessment & Plan Note (Signed)
Her blood pressure is elevated today. Because it was her birthday, yesterday she admits to dietary indiscretion.

## 2013-04-26 NOTE — Patient Instructions (Addendum)
Remote monitoring is used to monitor your Pacemaker of ICD from home. This monitoring reduces the number of office visits required to check your device to one time per year. It allows us to keep an eye on the functioning of your device to ensure it is working properly. You are scheduled for a device check from home on July 24, 2013. You may send your transmission at any time that day. If you have a wireless device, the transmission will be sent automatically. After your physician reviews your transmission, you will receive a postcard with your next transmission date.  Your physician wants you to follow-up in: 1 year with Brooke Edmisten, PA.  You will receive a reminder letter in the mail two months in advance. If you don't receive a letter, please call our office to schedule the follow-up appointment.  

## 2013-04-26 NOTE — Assessment & Plan Note (Signed)
Her [employer] dual-chamber pacemaker is working normally today. We'll plan to recheck in several months.

## 2013-04-26 NOTE — Progress Notes (Signed)
PCP: Rene Paci, MD Primary Cardiologist: Calton Dach, MD  Erica Hood is a 72 y.o. female who presents today for routine electrophysiology followup.  Since last being seen in our clinic, the patient reports doing very well.  Today, she denies symptoms of palpitations, chest pain, shortness of breath,  lower extremity edema, dizziness, presyncope, or syncope.  The patient is otherwise without complaint today. She reports exercising at the hospital in cardiac rehabilitation without difficulty except for been limited by back pain for which she receives steroid injections every 6 months.  She is a former patient of Dr Riley Kill and has now established with Dr Excell Seltzer.   Her device was interrogated today and found to be functioning normally.  She has had no atrial fibrillation since last check.  She is anticoagulated with Warfarin.    Past Medical History  Diagnosis Date  . OBESITY   . CAD, ARTERY BYPASS GRAFT 11/2009  . LUNG NODULE     stable RLL nodule CT 04/2010 and 10/2010 and 03/2011  . 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 PAF (paroxysmal atrial fibrillation)     chronic anticoag  . MYOCARDIAL INFARCTION, ACUTE, ANTERIOR WALL 11/2009  . Hypertension   . HYPERCHOLESTEROLEMIA  IIA   . Depression   . ARTHRITIS, CERVICAL SPINE   . Thyroid nodule 2011    neg bx 01/2010   Past Surgical History  Procedure Laterality Date  . Coronary artery bypass graft  11-2009  . Cholecystectomy    . Appendectomy    . Breast lumpectomy  1967    right breast (bening)  . Tonsillectomy    . Angioplasty      ans stenting of her LAD    Current Outpatient Prescriptions  Medication Sig Dispense Refill  . acetaminophen (TYLENOL) 500 MG tablet Take 500 mg by mouth as needed.        Marland Kitchen amLODipine (NORVASC) 10 MG tablet TAKE 1 TABLET BY MOUTH ONCE DAILY  90 tablet  1  . aspirin 81 MG tablet Take 81 mg by mouth daily.        Marland Kitchen HYDROcodone-acetaminophen (NORCO) 5-325 MG per tablet Take 1 tablet  by mouth every 6 (six) hours as needed.      Marland Kitchen losartan (COZAAR) 50 MG tablet TAKE ONE TABLET BY MOUTH ONCE DAILY  90 tablet  3  . methocarbamol (ROBAXIN) 500 MG tablet Take 500 mg by mouth 3 (three) times daily as needed.      . metoprolol succinate (TOPROL-XL) 100 MG 24 hr tablet Take 1 tablet (100 mg total) by mouth daily.  90 tablet  2  . pantoprazole (PROTONIX) 40 MG tablet TAKE 1 TABLET BY MOUTH ONCE DAILY  90 tablet  3  . warfarin (COUMADIN) 2.5 MG tablet TAKE 1 TABLET BY MOUTH AS DIRECTED.  135 tablet  1    Physical Exam: Filed Vitals:   04/26/13 0951  BP: 148/86  Weight: 238 lb 6.4 oz (108.138 kg)    GEN- The patient is well appearing, obese, 72 year old woman, who is alert and oriented x 3 today.   Head- normocephalic, atraumatic Eyes-  Sclera clear, conjunctiva pink Ears- hearing intact Oropharynx- clear Lungs- Clear to ausculation bilaterally, normal work of breathing Chest- pacemaker pocket is well healed Heart- Regular rate and rhythm, no murmurs, rubs or gallops, PMI not laterally displaced GI- soft, NT, ND, + BS Extremities- no clubbing, cyanosis, or edema  Pacemaker interrogation- reviewed in detail today,  See  PACEART report  Assessment and Plan:

## 2013-04-27 ENCOUNTER — Encounter (HOSPITAL_COMMUNITY): Payer: Self-pay

## 2013-04-28 ENCOUNTER — Ambulatory Visit (INDEPENDENT_AMBULATORY_CARE_PROVIDER_SITE_OTHER): Payer: Medicare Other | Admitting: *Deleted

## 2013-04-28 DIAGNOSIS — I4891 Unspecified atrial fibrillation: Secondary | ICD-10-CM

## 2013-04-28 LAB — POCT INR: INR: 1.5

## 2013-05-01 ENCOUNTER — Other Ambulatory Visit: Payer: Self-pay | Admitting: Cardiology

## 2013-05-02 ENCOUNTER — Encounter (HOSPITAL_COMMUNITY)
Admission: RE | Admit: 2013-05-02 | Discharge: 2013-05-02 | Disposition: A | Discharge status: Home / Self Care | Payer: Self-pay | Source: Ambulatory Visit | Attending: Cardiology | Admitting: Cardiology

## 2013-05-02 DIAGNOSIS — K219 Gastro-esophageal reflux disease without esophagitis: Secondary | ICD-10-CM | POA: Insufficient documentation

## 2013-05-02 DIAGNOSIS — E785 Hyperlipidemia, unspecified: Secondary | ICD-10-CM | POA: Insufficient documentation

## 2013-05-02 DIAGNOSIS — Z8249 Family history of ischemic heart disease and other diseases of the circulatory system: Secondary | ICD-10-CM | POA: Insufficient documentation

## 2013-05-02 DIAGNOSIS — I651 Occlusion and stenosis of basilar artery: Secondary | ICD-10-CM | POA: Insufficient documentation

## 2013-05-02 DIAGNOSIS — Z5189 Encounter for other specified aftercare: Secondary | ICD-10-CM | POA: Insufficient documentation

## 2013-05-02 DIAGNOSIS — E7800 Pure hypercholesterolemia, unspecified: Secondary | ICD-10-CM | POA: Insufficient documentation

## 2013-05-02 DIAGNOSIS — I4892 Unspecified atrial flutter: Secondary | ICD-10-CM | POA: Insufficient documentation

## 2013-05-02 DIAGNOSIS — I252 Old myocardial infarction: Secondary | ICD-10-CM | POA: Insufficient documentation

## 2013-05-02 DIAGNOSIS — F17200 Nicotine dependence, unspecified, uncomplicated: Secondary | ICD-10-CM | POA: Insufficient documentation

## 2013-05-02 DIAGNOSIS — Z9861 Coronary angioplasty status: Secondary | ICD-10-CM | POA: Insufficient documentation

## 2013-05-02 DIAGNOSIS — E669 Obesity, unspecified: Secondary | ICD-10-CM | POA: Insufficient documentation

## 2013-05-02 DIAGNOSIS — I6529 Occlusion and stenosis of unspecified carotid artery: Secondary | ICD-10-CM | POA: Insufficient documentation

## 2013-05-02 DIAGNOSIS — Z7902 Long term (current) use of antithrombotics/antiplatelets: Secondary | ICD-10-CM | POA: Insufficient documentation

## 2013-05-02 DIAGNOSIS — I2582 Chronic total occlusion of coronary artery: Secondary | ICD-10-CM | POA: Insufficient documentation

## 2013-05-02 DIAGNOSIS — Z7982 Long term (current) use of aspirin: Secondary | ICD-10-CM | POA: Insufficient documentation

## 2013-05-02 DIAGNOSIS — I2510 Atherosclerotic heart disease of native coronary artery without angina pectoris: Secondary | ICD-10-CM | POA: Insufficient documentation

## 2013-05-02 DIAGNOSIS — I509 Heart failure, unspecified: Secondary | ICD-10-CM | POA: Insufficient documentation

## 2013-05-02 DIAGNOSIS — I2589 Other forms of chronic ischemic heart disease: Secondary | ICD-10-CM | POA: Insufficient documentation

## 2013-05-02 DIAGNOSIS — I4891 Unspecified atrial fibrillation: Secondary | ICD-10-CM | POA: Insufficient documentation

## 2013-05-02 DIAGNOSIS — I10 Essential (primary) hypertension: Secondary | ICD-10-CM | POA: Insufficient documentation

## 2013-05-02 DIAGNOSIS — Z951 Presence of aortocoronary bypass graft: Secondary | ICD-10-CM | POA: Insufficient documentation

## 2013-05-03 ENCOUNTER — Encounter (HOSPITAL_COMMUNITY)
Admission: RE | Admit: 2013-05-03 | Discharge: 2013-05-03 | Disposition: A | Discharge status: Home / Self Care | Payer: Self-pay | Source: Ambulatory Visit | Attending: Cardiology | Admitting: Cardiology

## 2013-05-04 ENCOUNTER — Encounter (HOSPITAL_COMMUNITY): Payer: Self-pay

## 2013-05-08 ENCOUNTER — Ambulatory Visit (INDEPENDENT_AMBULATORY_CARE_PROVIDER_SITE_OTHER): Payer: Medicare Other | Admitting: Pharmacist

## 2013-05-08 DIAGNOSIS — I4891 Unspecified atrial fibrillation: Secondary | ICD-10-CM

## 2013-05-08 LAB — POCT INR: INR: 2.8

## 2013-05-08 MED ORDER — WARFARIN SODIUM 2.5 MG PO TABS: ORAL_TABLET | ORAL | Status: DC

## 2013-05-09 ENCOUNTER — Encounter (HOSPITAL_COMMUNITY)
Admission: RE | Admit: 2013-05-09 | Discharge: 2013-05-09 | Disposition: A | Discharge status: Home / Self Care | Payer: Self-pay | Source: Ambulatory Visit | Attending: Cardiology | Admitting: Cardiology

## 2013-05-10 ENCOUNTER — Encounter (HOSPITAL_COMMUNITY)
Admission: RE | Admit: 2013-05-10 | Discharge: 2013-05-10 | Disposition: A | Discharge status: Home / Self Care | Payer: Self-pay | Source: Ambulatory Visit | Attending: Cardiology | Admitting: Cardiology

## 2013-05-11 ENCOUNTER — Encounter (HOSPITAL_COMMUNITY)
Admission: RE | Admit: 2013-05-11 | Discharge: 2013-05-11 | Disposition: A | Discharge status: Home / Self Care | Payer: Self-pay | Source: Ambulatory Visit | Attending: Cardiology | Admitting: Cardiology

## 2013-05-13 LAB — PACEMAKER DEVICE OBSERVATION
AL IMPEDENCE PM: 460 Ohm
ATRIAL PACING PM: 22
BAMS-0003: 70 {beats}/min
BATTERY VOLTAGE: 2.95 V
RV LEAD IMPEDENCE PM: 380 Ohm

## 2013-05-16 ENCOUNTER — Encounter (HOSPITAL_COMMUNITY)
Admission: RE | Admit: 2013-05-16 | Discharge: 2013-05-16 | Disposition: A | Discharge status: Home / Self Care | Payer: Self-pay | Source: Ambulatory Visit | Attending: Cardiology | Admitting: Cardiology

## 2013-05-17 ENCOUNTER — Encounter (HOSPITAL_COMMUNITY)
Admission: RE | Admit: 2013-05-17 | Discharge: 2013-05-17 | Disposition: A | Discharge status: Home / Self Care | Payer: Self-pay | Source: Ambulatory Visit | Attending: Cardiology | Admitting: Cardiology

## 2013-05-18 ENCOUNTER — Encounter (HOSPITAL_COMMUNITY)
Admission: RE | Admit: 2013-05-18 | Discharge: 2013-05-18 | Disposition: A | Discharge status: Home / Self Care | Payer: Self-pay | Source: Ambulatory Visit | Attending: Cardiology | Admitting: Cardiology

## 2013-05-19 ENCOUNTER — Encounter: Payer: Self-pay | Admitting: Internal Medicine

## 2013-05-23 ENCOUNTER — Encounter (HOSPITAL_COMMUNITY): Payer: Self-pay

## 2013-05-24 ENCOUNTER — Encounter (HOSPITAL_COMMUNITY): Payer: Self-pay

## 2013-05-25 ENCOUNTER — Encounter (HOSPITAL_COMMUNITY): Payer: Self-pay

## 2013-05-30 ENCOUNTER — Encounter (HOSPITAL_COMMUNITY): Payer: Self-pay

## 2013-05-31 ENCOUNTER — Encounter (HOSPITAL_COMMUNITY): Payer: Self-pay

## 2013-06-01 ENCOUNTER — Encounter (HOSPITAL_COMMUNITY): Payer: Self-pay

## 2013-06-06 ENCOUNTER — Encounter (HOSPITAL_COMMUNITY): Payer: Self-pay

## 2013-06-06 DIAGNOSIS — I651 Occlusion and stenosis of basilar artery: Secondary | ICD-10-CM | POA: Insufficient documentation

## 2013-06-06 DIAGNOSIS — I4891 Unspecified atrial fibrillation: Secondary | ICD-10-CM | POA: Insufficient documentation

## 2013-06-06 DIAGNOSIS — Z951 Presence of aortocoronary bypass graft: Secondary | ICD-10-CM | POA: Insufficient documentation

## 2013-06-06 DIAGNOSIS — E669 Obesity, unspecified: Secondary | ICD-10-CM | POA: Insufficient documentation

## 2013-06-06 DIAGNOSIS — K219 Gastro-esophageal reflux disease without esophagitis: Secondary | ICD-10-CM | POA: Insufficient documentation

## 2013-06-06 DIAGNOSIS — Z9861 Coronary angioplasty status: Secondary | ICD-10-CM | POA: Insufficient documentation

## 2013-06-06 DIAGNOSIS — I2582 Chronic total occlusion of coronary artery: Secondary | ICD-10-CM | POA: Insufficient documentation

## 2013-06-06 DIAGNOSIS — I252 Old myocardial infarction: Secondary | ICD-10-CM | POA: Insufficient documentation

## 2013-06-06 DIAGNOSIS — E7800 Pure hypercholesterolemia, unspecified: Secondary | ICD-10-CM | POA: Insufficient documentation

## 2013-06-06 DIAGNOSIS — Z7902 Long term (current) use of antithrombotics/antiplatelets: Secondary | ICD-10-CM | POA: Insufficient documentation

## 2013-06-06 DIAGNOSIS — Z7982 Long term (current) use of aspirin: Secondary | ICD-10-CM | POA: Insufficient documentation

## 2013-06-06 DIAGNOSIS — I2589 Other forms of chronic ischemic heart disease: Secondary | ICD-10-CM | POA: Insufficient documentation

## 2013-06-06 DIAGNOSIS — I4892 Unspecified atrial flutter: Secondary | ICD-10-CM | POA: Insufficient documentation

## 2013-06-06 DIAGNOSIS — E785 Hyperlipidemia, unspecified: Secondary | ICD-10-CM | POA: Insufficient documentation

## 2013-06-06 DIAGNOSIS — Z5189 Encounter for other specified aftercare: Secondary | ICD-10-CM | POA: Insufficient documentation

## 2013-06-06 DIAGNOSIS — F17200 Nicotine dependence, unspecified, uncomplicated: Secondary | ICD-10-CM | POA: Insufficient documentation

## 2013-06-06 DIAGNOSIS — I2510 Atherosclerotic heart disease of native coronary artery without angina pectoris: Secondary | ICD-10-CM | POA: Insufficient documentation

## 2013-06-06 DIAGNOSIS — Z8249 Family history of ischemic heart disease and other diseases of the circulatory system: Secondary | ICD-10-CM | POA: Insufficient documentation

## 2013-06-06 DIAGNOSIS — I509 Heart failure, unspecified: Secondary | ICD-10-CM | POA: Insufficient documentation

## 2013-06-06 DIAGNOSIS — I6529 Occlusion and stenosis of unspecified carotid artery: Secondary | ICD-10-CM | POA: Insufficient documentation

## 2013-06-06 DIAGNOSIS — I10 Essential (primary) hypertension: Secondary | ICD-10-CM | POA: Insufficient documentation

## 2013-06-07 ENCOUNTER — Encounter (HOSPITAL_COMMUNITY): Payer: Self-pay

## 2013-06-08 ENCOUNTER — Encounter (HOSPITAL_COMMUNITY): Payer: Self-pay

## 2013-06-13 ENCOUNTER — Encounter (HOSPITAL_COMMUNITY): Payer: Self-pay

## 2013-06-14 ENCOUNTER — Encounter (HOSPITAL_COMMUNITY): Payer: Self-pay

## 2013-06-15 ENCOUNTER — Encounter (HOSPITAL_COMMUNITY): Payer: Self-pay

## 2013-06-20 ENCOUNTER — Ambulatory Visit (INDEPENDENT_AMBULATORY_CARE_PROVIDER_SITE_OTHER): Payer: Medicare Other | Admitting: *Deleted

## 2013-06-20 ENCOUNTER — Encounter (HOSPITAL_COMMUNITY)
Admission: RE | Admit: 2013-06-20 | Discharge: 2013-06-20 | Disposition: A | Discharge status: Home / Self Care | Payer: Self-pay | Source: Ambulatory Visit | Attending: Cardiology | Admitting: Cardiology

## 2013-06-20 DIAGNOSIS — I4891 Unspecified atrial fibrillation: Secondary | ICD-10-CM

## 2013-06-20 LAB — POCT INR: INR: 2.5

## 2013-06-21 ENCOUNTER — Encounter (HOSPITAL_COMMUNITY)
Admission: RE | Admit: 2013-06-21 | Discharge: 2013-06-21 | Disposition: A | Discharge status: Home / Self Care | Payer: Self-pay | Source: Ambulatory Visit | Attending: Cardiology | Admitting: Cardiology

## 2013-06-22 ENCOUNTER — Encounter (HOSPITAL_COMMUNITY)
Admission: RE | Admit: 2013-06-22 | Discharge: 2013-06-22 | Disposition: A | Discharge status: Home / Self Care | Payer: Self-pay | Source: Ambulatory Visit | Attending: Cardiology | Admitting: Cardiology

## 2013-06-27 ENCOUNTER — Encounter (HOSPITAL_COMMUNITY)
Admission: RE | Admit: 2013-06-27 | Discharge: 2013-06-27 | Disposition: A | Discharge status: Home / Self Care | Payer: Self-pay | Source: Ambulatory Visit | Attending: Cardiology | Admitting: Cardiology

## 2013-06-28 ENCOUNTER — Encounter (HOSPITAL_COMMUNITY)
Admission: RE | Admit: 2013-06-28 | Discharge: 2013-06-28 | Disposition: A | Discharge status: Home / Self Care | Payer: Self-pay | Source: Ambulatory Visit | Attending: Cardiology | Admitting: Cardiology

## 2013-06-29 ENCOUNTER — Encounter (HOSPITAL_COMMUNITY)
Admission: RE | Admit: 2013-06-29 | Discharge: 2013-06-29 | Disposition: A | Discharge status: Home / Self Care | Payer: Self-pay | Source: Ambulatory Visit | Attending: Cardiology | Admitting: Cardiology

## 2013-07-04 ENCOUNTER — Encounter (HOSPITAL_COMMUNITY): Payer: Medicare Other

## 2013-07-04 DIAGNOSIS — Z5189 Encounter for other specified aftercare: Secondary | ICD-10-CM | POA: Insufficient documentation

## 2013-07-04 DIAGNOSIS — E669 Obesity, unspecified: Secondary | ICD-10-CM | POA: Insufficient documentation

## 2013-07-04 DIAGNOSIS — I4891 Unspecified atrial fibrillation: Secondary | ICD-10-CM | POA: Insufficient documentation

## 2013-07-04 DIAGNOSIS — I2589 Other forms of chronic ischemic heart disease: Secondary | ICD-10-CM | POA: Insufficient documentation

## 2013-07-04 DIAGNOSIS — Z951 Presence of aortocoronary bypass graft: Secondary | ICD-10-CM | POA: Insufficient documentation

## 2013-07-04 DIAGNOSIS — I6529 Occlusion and stenosis of unspecified carotid artery: Secondary | ICD-10-CM | POA: Insufficient documentation

## 2013-07-04 DIAGNOSIS — Z7982 Long term (current) use of aspirin: Secondary | ICD-10-CM | POA: Insufficient documentation

## 2013-07-04 DIAGNOSIS — E7800 Pure hypercholesterolemia, unspecified: Secondary | ICD-10-CM | POA: Insufficient documentation

## 2013-07-04 DIAGNOSIS — F17200 Nicotine dependence, unspecified, uncomplicated: Secondary | ICD-10-CM | POA: Insufficient documentation

## 2013-07-04 DIAGNOSIS — Z9861 Coronary angioplasty status: Secondary | ICD-10-CM | POA: Insufficient documentation

## 2013-07-04 DIAGNOSIS — I2582 Chronic total occlusion of coronary artery: Secondary | ICD-10-CM | POA: Insufficient documentation

## 2013-07-04 DIAGNOSIS — I509 Heart failure, unspecified: Secondary | ICD-10-CM | POA: Insufficient documentation

## 2013-07-04 DIAGNOSIS — I10 Essential (primary) hypertension: Secondary | ICD-10-CM | POA: Insufficient documentation

## 2013-07-04 DIAGNOSIS — I2510 Atherosclerotic heart disease of native coronary artery without angina pectoris: Secondary | ICD-10-CM | POA: Insufficient documentation

## 2013-07-04 DIAGNOSIS — I651 Occlusion and stenosis of basilar artery: Secondary | ICD-10-CM | POA: Insufficient documentation

## 2013-07-04 DIAGNOSIS — K219 Gastro-esophageal reflux disease without esophagitis: Secondary | ICD-10-CM | POA: Insufficient documentation

## 2013-07-04 DIAGNOSIS — E785 Hyperlipidemia, unspecified: Secondary | ICD-10-CM | POA: Insufficient documentation

## 2013-07-04 DIAGNOSIS — I4892 Unspecified atrial flutter: Secondary | ICD-10-CM | POA: Insufficient documentation

## 2013-07-04 DIAGNOSIS — I252 Old myocardial infarction: Secondary | ICD-10-CM | POA: Insufficient documentation

## 2013-07-04 DIAGNOSIS — Z7902 Long term (current) use of antithrombotics/antiplatelets: Secondary | ICD-10-CM | POA: Insufficient documentation

## 2013-07-04 DIAGNOSIS — Z8249 Family history of ischemic heart disease and other diseases of the circulatory system: Secondary | ICD-10-CM | POA: Insufficient documentation

## 2013-07-05 ENCOUNTER — Encounter (HOSPITAL_COMMUNITY): Payer: Medicare Other

## 2013-07-06 ENCOUNTER — Encounter (HOSPITAL_COMMUNITY): Payer: Medicare Other

## 2013-07-11 ENCOUNTER — Encounter (HOSPITAL_COMMUNITY): Payer: Medicare Other

## 2013-07-12 ENCOUNTER — Encounter (HOSPITAL_COMMUNITY)
Admission: RE | Admit: 2013-07-12 | Discharge: 2013-07-12 | Disposition: A | Discharge status: Home / Self Care | Payer: Self-pay | Source: Ambulatory Visit | Attending: Cardiology | Admitting: Cardiology

## 2013-07-13 ENCOUNTER — Encounter (HOSPITAL_COMMUNITY)
Admission: RE | Admit: 2013-07-13 | Discharge: 2013-07-13 | Disposition: A | Discharge status: Home / Self Care | Payer: Self-pay | Source: Ambulatory Visit | Attending: Cardiology | Admitting: Cardiology

## 2013-07-18 ENCOUNTER — Encounter (HOSPITAL_COMMUNITY): Payer: Medicare Other

## 2013-07-19 ENCOUNTER — Encounter (HOSPITAL_COMMUNITY): Payer: Medicare Other

## 2013-07-20 ENCOUNTER — Encounter (HOSPITAL_COMMUNITY): Payer: Medicare Other

## 2013-07-24 ENCOUNTER — Encounter: Payer: 59 | Admitting: *Deleted

## 2013-07-25 ENCOUNTER — Encounter (HOSPITAL_COMMUNITY): Payer: Medicare Other

## 2013-07-26 ENCOUNTER — Encounter (HOSPITAL_COMMUNITY): Payer: Medicare Other

## 2013-07-27 ENCOUNTER — Encounter (HOSPITAL_COMMUNITY): Payer: Medicare Other

## 2013-07-27 ENCOUNTER — Encounter: Payer: Self-pay | Admitting: *Deleted

## 2013-08-01 ENCOUNTER — Encounter (HOSPITAL_COMMUNITY): Payer: Medicare Other

## 2013-08-02 ENCOUNTER — Encounter (HOSPITAL_COMMUNITY): Payer: Medicare Other

## 2013-08-02 DIAGNOSIS — Z9861 Coronary angioplasty status: Secondary | ICD-10-CM | POA: Insufficient documentation

## 2013-08-02 DIAGNOSIS — I2582 Chronic total occlusion of coronary artery: Secondary | ICD-10-CM | POA: Insufficient documentation

## 2013-08-02 DIAGNOSIS — I2589 Other forms of chronic ischemic heart disease: Secondary | ICD-10-CM | POA: Insufficient documentation

## 2013-08-02 DIAGNOSIS — Z8249 Family history of ischemic heart disease and other diseases of the circulatory system: Secondary | ICD-10-CM | POA: Insufficient documentation

## 2013-08-02 DIAGNOSIS — E785 Hyperlipidemia, unspecified: Secondary | ICD-10-CM | POA: Insufficient documentation

## 2013-08-02 DIAGNOSIS — K219 Gastro-esophageal reflux disease without esophagitis: Secondary | ICD-10-CM | POA: Insufficient documentation

## 2013-08-02 DIAGNOSIS — I10 Essential (primary) hypertension: Secondary | ICD-10-CM | POA: Insufficient documentation

## 2013-08-02 DIAGNOSIS — E7800 Pure hypercholesterolemia, unspecified: Secondary | ICD-10-CM | POA: Insufficient documentation

## 2013-08-02 DIAGNOSIS — Z7982 Long term (current) use of aspirin: Secondary | ICD-10-CM | POA: Insufficient documentation

## 2013-08-02 DIAGNOSIS — Z5189 Encounter for other specified aftercare: Secondary | ICD-10-CM | POA: Insufficient documentation

## 2013-08-02 DIAGNOSIS — F17200 Nicotine dependence, unspecified, uncomplicated: Secondary | ICD-10-CM | POA: Insufficient documentation

## 2013-08-02 DIAGNOSIS — I252 Old myocardial infarction: Secondary | ICD-10-CM | POA: Insufficient documentation

## 2013-08-02 DIAGNOSIS — E669 Obesity, unspecified: Secondary | ICD-10-CM | POA: Insufficient documentation

## 2013-08-02 DIAGNOSIS — I651 Occlusion and stenosis of basilar artery: Secondary | ICD-10-CM | POA: Insufficient documentation

## 2013-08-02 DIAGNOSIS — I4891 Unspecified atrial fibrillation: Secondary | ICD-10-CM | POA: Insufficient documentation

## 2013-08-02 DIAGNOSIS — I6529 Occlusion and stenosis of unspecified carotid artery: Secondary | ICD-10-CM | POA: Insufficient documentation

## 2013-08-02 DIAGNOSIS — Z951 Presence of aortocoronary bypass graft: Secondary | ICD-10-CM | POA: Insufficient documentation

## 2013-08-02 DIAGNOSIS — Z7902 Long term (current) use of antithrombotics/antiplatelets: Secondary | ICD-10-CM | POA: Insufficient documentation

## 2013-08-02 DIAGNOSIS — I509 Heart failure, unspecified: Secondary | ICD-10-CM | POA: Insufficient documentation

## 2013-08-02 DIAGNOSIS — I4892 Unspecified atrial flutter: Secondary | ICD-10-CM | POA: Insufficient documentation

## 2013-08-02 DIAGNOSIS — I2510 Atherosclerotic heart disease of native coronary artery without angina pectoris: Secondary | ICD-10-CM | POA: Insufficient documentation

## 2013-08-03 ENCOUNTER — Encounter (HOSPITAL_COMMUNITY): Payer: Medicare Other

## 2013-08-08 ENCOUNTER — Encounter (HOSPITAL_COMMUNITY)
Admission: RE | Admit: 2013-08-08 | Discharge: 2013-08-08 | Disposition: A | Discharge status: Home / Self Care | Payer: Self-pay | Source: Ambulatory Visit | Attending: Cardiology | Admitting: Cardiology

## 2013-08-09 ENCOUNTER — Encounter (HOSPITAL_COMMUNITY): Payer: Medicare Other

## 2013-08-10 ENCOUNTER — Encounter (HOSPITAL_COMMUNITY)
Admission: RE | Admit: 2013-08-10 | Discharge: 2013-08-10 | Disposition: A | Discharge status: Home / Self Care | Payer: Self-pay | Source: Ambulatory Visit | Attending: Cardiology | Admitting: Cardiology

## 2013-08-15 ENCOUNTER — Encounter (HOSPITAL_COMMUNITY): Payer: Medicare Other

## 2013-08-16 ENCOUNTER — Encounter: Payer: Self-pay | Admitting: *Deleted

## 2013-08-16 ENCOUNTER — Encounter (HOSPITAL_COMMUNITY): Payer: Medicare Other

## 2013-08-17 ENCOUNTER — Encounter (HOSPITAL_COMMUNITY): Payer: Medicare Other

## 2013-08-22 ENCOUNTER — Encounter (HOSPITAL_COMMUNITY)
Admission: RE | Admit: 2013-08-22 | Discharge: 2013-08-22 | Disposition: A | Discharge status: Home / Self Care | Payer: Self-pay | Source: Ambulatory Visit | Attending: Cardiology | Admitting: Cardiology

## 2013-08-23 ENCOUNTER — Encounter (HOSPITAL_COMMUNITY)
Admission: RE | Admit: 2013-08-23 | Discharge: 2013-08-23 | Disposition: A | Discharge status: Home / Self Care | Payer: Self-pay | Source: Ambulatory Visit | Attending: Cardiology | Admitting: Cardiology

## 2013-08-24 ENCOUNTER — Encounter (HOSPITAL_COMMUNITY)
Admission: RE | Admit: 2013-08-24 | Discharge: 2013-08-24 | Disposition: A | Discharge status: Home / Self Care | Payer: Self-pay | Source: Ambulatory Visit | Attending: Cardiology | Admitting: Cardiology

## 2013-08-29 ENCOUNTER — Encounter (HOSPITAL_COMMUNITY)
Admission: RE | Admit: 2013-08-29 | Discharge: 2013-08-29 | Disposition: A | Discharge status: Home / Self Care | Payer: Self-pay | Source: Ambulatory Visit | Attending: Cardiology | Admitting: Cardiology

## 2013-08-30 ENCOUNTER — Encounter (HOSPITAL_COMMUNITY)
Admission: RE | Admit: 2013-08-30 | Discharge: 2013-08-30 | Disposition: A | Discharge status: Home / Self Care | Payer: Self-pay | Source: Ambulatory Visit | Attending: Cardiology | Admitting: Cardiology

## 2013-08-31 ENCOUNTER — Encounter (HOSPITAL_COMMUNITY)
Admission: RE | Admit: 2013-08-31 | Discharge: 2013-08-31 | Disposition: A | Discharge status: Home / Self Care | Payer: Self-pay | Source: Ambulatory Visit | Attending: Cardiology | Admitting: Cardiology

## 2013-09-05 ENCOUNTER — Encounter (HOSPITAL_COMMUNITY): Payer: Medicare Other

## 2013-09-05 DIAGNOSIS — I2589 Other forms of chronic ischemic heart disease: Secondary | ICD-10-CM | POA: Insufficient documentation

## 2013-09-05 DIAGNOSIS — Z951 Presence of aortocoronary bypass graft: Secondary | ICD-10-CM | POA: Insufficient documentation

## 2013-09-05 DIAGNOSIS — E785 Hyperlipidemia, unspecified: Secondary | ICD-10-CM | POA: Insufficient documentation

## 2013-09-05 DIAGNOSIS — Z9861 Coronary angioplasty status: Secondary | ICD-10-CM | POA: Insufficient documentation

## 2013-09-05 DIAGNOSIS — K219 Gastro-esophageal reflux disease without esophagitis: Secondary | ICD-10-CM | POA: Insufficient documentation

## 2013-09-05 DIAGNOSIS — F17200 Nicotine dependence, unspecified, uncomplicated: Secondary | ICD-10-CM | POA: Insufficient documentation

## 2013-09-05 DIAGNOSIS — I509 Heart failure, unspecified: Secondary | ICD-10-CM | POA: Insufficient documentation

## 2013-09-05 DIAGNOSIS — I4892 Unspecified atrial flutter: Secondary | ICD-10-CM | POA: Insufficient documentation

## 2013-09-05 DIAGNOSIS — I4891 Unspecified atrial fibrillation: Secondary | ICD-10-CM | POA: Insufficient documentation

## 2013-09-05 DIAGNOSIS — I6529 Occlusion and stenosis of unspecified carotid artery: Secondary | ICD-10-CM | POA: Insufficient documentation

## 2013-09-05 DIAGNOSIS — Z7982 Long term (current) use of aspirin: Secondary | ICD-10-CM | POA: Insufficient documentation

## 2013-09-05 DIAGNOSIS — E7800 Pure hypercholesterolemia, unspecified: Secondary | ICD-10-CM | POA: Insufficient documentation

## 2013-09-05 DIAGNOSIS — I10 Essential (primary) hypertension: Secondary | ICD-10-CM | POA: Insufficient documentation

## 2013-09-05 DIAGNOSIS — Z7902 Long term (current) use of antithrombotics/antiplatelets: Secondary | ICD-10-CM | POA: Insufficient documentation

## 2013-09-05 DIAGNOSIS — I2582 Chronic total occlusion of coronary artery: Secondary | ICD-10-CM | POA: Insufficient documentation

## 2013-09-05 DIAGNOSIS — I252 Old myocardial infarction: Secondary | ICD-10-CM | POA: Insufficient documentation

## 2013-09-05 DIAGNOSIS — I2510 Atherosclerotic heart disease of native coronary artery without angina pectoris: Secondary | ICD-10-CM | POA: Insufficient documentation

## 2013-09-05 DIAGNOSIS — I651 Occlusion and stenosis of basilar artery: Secondary | ICD-10-CM | POA: Insufficient documentation

## 2013-09-05 DIAGNOSIS — Z8249 Family history of ischemic heart disease and other diseases of the circulatory system: Secondary | ICD-10-CM | POA: Insufficient documentation

## 2013-09-05 DIAGNOSIS — E669 Obesity, unspecified: Secondary | ICD-10-CM | POA: Insufficient documentation

## 2013-09-05 DIAGNOSIS — Z5189 Encounter for other specified aftercare: Secondary | ICD-10-CM | POA: Insufficient documentation

## 2013-09-06 ENCOUNTER — Encounter (HOSPITAL_COMMUNITY)
Admission: RE | Admit: 2013-09-06 | Discharge: 2013-09-06 | Disposition: A | Discharge status: Home / Self Care | Payer: Self-pay | Source: Ambulatory Visit | Attending: Cardiology | Admitting: Cardiology

## 2013-09-07 ENCOUNTER — Encounter (HOSPITAL_COMMUNITY)
Admission: RE | Admit: 2013-09-07 | Discharge: 2013-09-07 | Disposition: A | Discharge status: Home / Self Care | Payer: Self-pay | Source: Ambulatory Visit | Attending: Cardiology | Admitting: Cardiology

## 2013-09-07 ENCOUNTER — Ambulatory Visit (INDEPENDENT_AMBULATORY_CARE_PROVIDER_SITE_OTHER): Payer: Medicare Other | Admitting: General Practice

## 2013-09-07 ENCOUNTER — Other Ambulatory Visit: Payer: Self-pay

## 2013-09-07 DIAGNOSIS — I4891 Unspecified atrial fibrillation: Secondary | ICD-10-CM

## 2013-09-12 ENCOUNTER — Encounter (HOSPITAL_COMMUNITY)
Admission: RE | Admit: 2013-09-12 | Discharge: 2013-09-12 | Disposition: A | Discharge status: Home / Self Care | Payer: Self-pay | Source: Ambulatory Visit | Attending: Cardiology | Admitting: Cardiology

## 2013-09-13 ENCOUNTER — Encounter (HOSPITAL_COMMUNITY)
Admission: RE | Admit: 2013-09-13 | Discharge: 2013-09-13 | Disposition: A | Discharge status: Home / Self Care | Payer: Self-pay | Source: Ambulatory Visit | Attending: Cardiology | Admitting: Cardiology

## 2013-09-14 ENCOUNTER — Encounter (HOSPITAL_COMMUNITY)
Admission: RE | Admit: 2013-09-14 | Discharge: 2013-09-14 | Disposition: A | Discharge status: Home / Self Care | Payer: Self-pay | Source: Ambulatory Visit | Attending: Cardiology | Admitting: Cardiology

## 2013-09-14 ENCOUNTER — Other Ambulatory Visit: Payer: Self-pay | Admitting: Cardiovascular Disease

## 2013-09-19 ENCOUNTER — Encounter (HOSPITAL_COMMUNITY)
Admission: RE | Admit: 2013-09-19 | Discharge: 2013-09-19 | Disposition: A | Discharge status: Home / Self Care | Payer: Self-pay | Source: Ambulatory Visit | Attending: Cardiology | Admitting: Cardiology

## 2013-09-20 ENCOUNTER — Encounter (HOSPITAL_COMMUNITY)
Admission: RE | Admit: 2013-09-20 | Discharge: 2013-09-20 | Disposition: A | Discharge status: Home / Self Care | Payer: Self-pay | Source: Ambulatory Visit | Attending: Cardiology | Admitting: Cardiology

## 2013-09-21 ENCOUNTER — Encounter (HOSPITAL_COMMUNITY)
Admission: RE | Admit: 2013-09-21 | Discharge: 2013-09-21 | Disposition: A | Discharge status: Home / Self Care | Payer: Self-pay | Source: Ambulatory Visit | Attending: Cardiology | Admitting: Cardiology

## 2013-09-26 ENCOUNTER — Encounter (HOSPITAL_COMMUNITY)
Admission: RE | Admit: 2013-09-26 | Discharge: 2013-09-26 | Disposition: A | Discharge status: Home / Self Care | Payer: Self-pay | Source: Ambulatory Visit | Attending: Cardiology | Admitting: Cardiology

## 2013-09-27 ENCOUNTER — Encounter (HOSPITAL_COMMUNITY): Payer: Medicare Other

## 2013-10-03 ENCOUNTER — Encounter (HOSPITAL_COMMUNITY)
Admission: RE | Admit: 2013-10-03 | Discharge: 2013-10-03 | Disposition: A | Discharge status: Home / Self Care | Payer: Self-pay | Source: Ambulatory Visit | Attending: Cardiology | Admitting: Cardiology

## 2013-10-03 DIAGNOSIS — F17200 Nicotine dependence, unspecified, uncomplicated: Secondary | ICD-10-CM | POA: Insufficient documentation

## 2013-10-03 DIAGNOSIS — I252 Old myocardial infarction: Secondary | ICD-10-CM | POA: Insufficient documentation

## 2013-10-03 DIAGNOSIS — I2589 Other forms of chronic ischemic heart disease: Secondary | ICD-10-CM | POA: Insufficient documentation

## 2013-10-03 DIAGNOSIS — E669 Obesity, unspecified: Secondary | ICD-10-CM | POA: Insufficient documentation

## 2013-10-03 DIAGNOSIS — Z9861 Coronary angioplasty status: Secondary | ICD-10-CM | POA: Insufficient documentation

## 2013-10-03 DIAGNOSIS — I2510 Atherosclerotic heart disease of native coronary artery without angina pectoris: Secondary | ICD-10-CM | POA: Insufficient documentation

## 2013-10-03 DIAGNOSIS — I6529 Occlusion and stenosis of unspecified carotid artery: Secondary | ICD-10-CM | POA: Insufficient documentation

## 2013-10-03 DIAGNOSIS — Z951 Presence of aortocoronary bypass graft: Secondary | ICD-10-CM | POA: Insufficient documentation

## 2013-10-03 DIAGNOSIS — E785 Hyperlipidemia, unspecified: Secondary | ICD-10-CM | POA: Insufficient documentation

## 2013-10-03 DIAGNOSIS — Z7982 Long term (current) use of aspirin: Secondary | ICD-10-CM | POA: Insufficient documentation

## 2013-10-03 DIAGNOSIS — I4892 Unspecified atrial flutter: Secondary | ICD-10-CM | POA: Insufficient documentation

## 2013-10-03 DIAGNOSIS — Z7902 Long term (current) use of antithrombotics/antiplatelets: Secondary | ICD-10-CM | POA: Insufficient documentation

## 2013-10-03 DIAGNOSIS — Z5189 Encounter for other specified aftercare: Secondary | ICD-10-CM | POA: Insufficient documentation

## 2013-10-03 DIAGNOSIS — K219 Gastro-esophageal reflux disease without esophagitis: Secondary | ICD-10-CM | POA: Insufficient documentation

## 2013-10-03 DIAGNOSIS — E7800 Pure hypercholesterolemia, unspecified: Secondary | ICD-10-CM | POA: Insufficient documentation

## 2013-10-03 DIAGNOSIS — I651 Occlusion and stenosis of basilar artery: Secondary | ICD-10-CM | POA: Insufficient documentation

## 2013-10-03 DIAGNOSIS — Z8249 Family history of ischemic heart disease and other diseases of the circulatory system: Secondary | ICD-10-CM | POA: Insufficient documentation

## 2013-10-03 DIAGNOSIS — I10 Essential (primary) hypertension: Secondary | ICD-10-CM | POA: Insufficient documentation

## 2013-10-03 DIAGNOSIS — I509 Heart failure, unspecified: Secondary | ICD-10-CM | POA: Insufficient documentation

## 2013-10-03 DIAGNOSIS — I4891 Unspecified atrial fibrillation: Secondary | ICD-10-CM | POA: Insufficient documentation

## 2013-10-03 DIAGNOSIS — I2582 Chronic total occlusion of coronary artery: Secondary | ICD-10-CM | POA: Insufficient documentation

## 2013-10-04 ENCOUNTER — Encounter (HOSPITAL_COMMUNITY)
Admission: RE | Admit: 2013-10-04 | Discharge: 2013-10-04 | Disposition: A | Discharge status: Home / Self Care | Payer: Self-pay | Source: Ambulatory Visit | Attending: Cardiology | Admitting: Cardiology

## 2013-10-04 ENCOUNTER — Other Ambulatory Visit: Payer: Self-pay | Admitting: Cardiology

## 2013-10-05 ENCOUNTER — Encounter (HOSPITAL_COMMUNITY)
Admission: RE | Admit: 2013-10-05 | Discharge: 2013-10-05 | Disposition: A | Discharge status: Home / Self Care | Payer: Self-pay | Source: Ambulatory Visit | Attending: Cardiology | Admitting: Cardiology

## 2013-10-05 ENCOUNTER — Ambulatory Visit (INDEPENDENT_AMBULATORY_CARE_PROVIDER_SITE_OTHER): Payer: Medicare Other | Admitting: General Practice

## 2013-10-05 DIAGNOSIS — I4891 Unspecified atrial fibrillation: Secondary | ICD-10-CM

## 2013-10-05 LAB — POCT INR: INR: 3.2

## 2013-10-10 ENCOUNTER — Encounter (HOSPITAL_COMMUNITY)
Admission: RE | Admit: 2013-10-10 | Discharge: 2013-10-10 | Disposition: A | Discharge status: Home / Self Care | Payer: Self-pay | Source: Ambulatory Visit | Attending: Cardiology | Admitting: Cardiology

## 2013-10-11 ENCOUNTER — Encounter (HOSPITAL_COMMUNITY): Payer: Medicare Other

## 2013-10-12 ENCOUNTER — Encounter (HOSPITAL_COMMUNITY): Payer: Medicare Other

## 2013-10-17 ENCOUNTER — Encounter (HOSPITAL_COMMUNITY)
Admission: RE | Admit: 2013-10-17 | Discharge: 2013-10-17 | Disposition: A | Discharge status: Home / Self Care | Payer: Self-pay | Source: Ambulatory Visit | Attending: Cardiology | Admitting: Cardiology

## 2013-10-18 ENCOUNTER — Encounter (HOSPITAL_COMMUNITY)
Admission: RE | Admit: 2013-10-18 | Discharge: 2013-10-18 | Disposition: A | Discharge status: Home / Self Care | Payer: Self-pay | Source: Ambulatory Visit | Attending: Cardiology | Admitting: Cardiology

## 2013-10-19 ENCOUNTER — Encounter (HOSPITAL_COMMUNITY)
Admission: RE | Admit: 2013-10-19 | Discharge: 2013-10-19 | Disposition: A | Discharge status: Home / Self Care | Payer: Self-pay | Source: Ambulatory Visit | Attending: Cardiology | Admitting: Cardiology

## 2013-10-24 ENCOUNTER — Encounter (HOSPITAL_COMMUNITY)
Admission: RE | Admit: 2013-10-24 | Discharge: 2013-10-24 | Disposition: A | Discharge status: Home / Self Care | Payer: Self-pay | Source: Ambulatory Visit | Attending: Cardiology | Admitting: Cardiology

## 2013-10-25 ENCOUNTER — Encounter (HOSPITAL_COMMUNITY): Payer: Medicare Other

## 2013-10-31 ENCOUNTER — Ambulatory Visit (INDEPENDENT_AMBULATORY_CARE_PROVIDER_SITE_OTHER): Payer: Medicare Other

## 2013-10-31 ENCOUNTER — Encounter (HOSPITAL_COMMUNITY)
Admission: RE | Admit: 2013-10-31 | Discharge: 2013-10-31 | Disposition: A | Discharge status: Home / Self Care | Payer: Self-pay | Source: Ambulatory Visit | Attending: Cardiology | Admitting: Cardiology

## 2013-10-31 DIAGNOSIS — I4891 Unspecified atrial fibrillation: Secondary | ICD-10-CM

## 2013-10-31 LAB — POCT INR: INR: 2

## 2013-11-01 ENCOUNTER — Encounter (HOSPITAL_COMMUNITY): Admission: RE | Admit: 2013-11-01 | Payer: Medicare Other | Source: Ambulatory Visit

## 2013-11-07 ENCOUNTER — Encounter (HOSPITAL_COMMUNITY): Payer: Medicare Other

## 2013-11-07 DIAGNOSIS — Z951 Presence of aortocoronary bypass graft: Secondary | ICD-10-CM | POA: Insufficient documentation

## 2013-11-07 DIAGNOSIS — Z7982 Long term (current) use of aspirin: Secondary | ICD-10-CM | POA: Insufficient documentation

## 2013-11-07 DIAGNOSIS — I2510 Atherosclerotic heart disease of native coronary artery without angina pectoris: Secondary | ICD-10-CM | POA: Insufficient documentation

## 2013-11-07 DIAGNOSIS — I6529 Occlusion and stenosis of unspecified carotid artery: Secondary | ICD-10-CM | POA: Insufficient documentation

## 2013-11-07 DIAGNOSIS — I4892 Unspecified atrial flutter: Secondary | ICD-10-CM | POA: Insufficient documentation

## 2013-11-07 DIAGNOSIS — K219 Gastro-esophageal reflux disease without esophagitis: Secondary | ICD-10-CM | POA: Insufficient documentation

## 2013-11-07 DIAGNOSIS — F17200 Nicotine dependence, unspecified, uncomplicated: Secondary | ICD-10-CM | POA: Insufficient documentation

## 2013-11-07 DIAGNOSIS — I509 Heart failure, unspecified: Secondary | ICD-10-CM | POA: Insufficient documentation

## 2013-11-07 DIAGNOSIS — I2589 Other forms of chronic ischemic heart disease: Secondary | ICD-10-CM | POA: Insufficient documentation

## 2013-11-07 DIAGNOSIS — I651 Occlusion and stenosis of basilar artery: Secondary | ICD-10-CM | POA: Insufficient documentation

## 2013-11-07 DIAGNOSIS — I10 Essential (primary) hypertension: Secondary | ICD-10-CM | POA: Insufficient documentation

## 2013-11-07 DIAGNOSIS — E7800 Pure hypercholesterolemia, unspecified: Secondary | ICD-10-CM | POA: Insufficient documentation

## 2013-11-07 DIAGNOSIS — E785 Hyperlipidemia, unspecified: Secondary | ICD-10-CM | POA: Insufficient documentation

## 2013-11-07 DIAGNOSIS — Z5189 Encounter for other specified aftercare: Secondary | ICD-10-CM | POA: Insufficient documentation

## 2013-11-07 DIAGNOSIS — Z8249 Family history of ischemic heart disease and other diseases of the circulatory system: Secondary | ICD-10-CM | POA: Insufficient documentation

## 2013-11-07 DIAGNOSIS — Z9861 Coronary angioplasty status: Secondary | ICD-10-CM | POA: Insufficient documentation

## 2013-11-07 DIAGNOSIS — Z7902 Long term (current) use of antithrombotics/antiplatelets: Secondary | ICD-10-CM | POA: Insufficient documentation

## 2013-11-07 DIAGNOSIS — I2582 Chronic total occlusion of coronary artery: Secondary | ICD-10-CM | POA: Insufficient documentation

## 2013-11-07 DIAGNOSIS — I252 Old myocardial infarction: Secondary | ICD-10-CM | POA: Insufficient documentation

## 2013-11-07 DIAGNOSIS — I4891 Unspecified atrial fibrillation: Secondary | ICD-10-CM | POA: Insufficient documentation

## 2013-11-07 DIAGNOSIS — E669 Obesity, unspecified: Secondary | ICD-10-CM | POA: Insufficient documentation

## 2013-11-08 ENCOUNTER — Encounter (HOSPITAL_COMMUNITY): Payer: Medicare Other

## 2013-11-09 ENCOUNTER — Encounter (HOSPITAL_COMMUNITY): Payer: Medicare Other

## 2013-11-14 ENCOUNTER — Encounter (HOSPITAL_COMMUNITY): Payer: Medicare Other

## 2013-11-15 ENCOUNTER — Encounter (HOSPITAL_COMMUNITY): Payer: Medicare Other

## 2013-11-16 ENCOUNTER — Encounter (HOSPITAL_COMMUNITY): Payer: Medicare Other

## 2013-11-21 ENCOUNTER — Encounter (HOSPITAL_COMMUNITY)
Admission: RE | Admit: 2013-11-21 | Discharge: 2013-11-21 | Disposition: A | Discharge status: Home / Self Care | Payer: Self-pay | Source: Ambulatory Visit | Attending: Cardiology | Admitting: Cardiology

## 2013-11-22 ENCOUNTER — Encounter (HOSPITAL_COMMUNITY)
Admission: RE | Admit: 2013-11-22 | Discharge: 2013-11-22 | Disposition: A | Discharge status: Home / Self Care | Payer: Self-pay | Source: Ambulatory Visit | Attending: Cardiology | Admitting: Cardiology

## 2013-11-23 ENCOUNTER — Encounter (HOSPITAL_COMMUNITY)
Admission: RE | Admit: 2013-11-23 | Discharge: 2013-11-23 | Disposition: A | Discharge status: Home / Self Care | Payer: Self-pay | Source: Ambulatory Visit | Attending: Cardiology | Admitting: Cardiology

## 2013-11-28 ENCOUNTER — Encounter (HOSPITAL_COMMUNITY)
Admission: RE | Admit: 2013-11-28 | Discharge: 2013-11-28 | Disposition: A | Discharge status: Home / Self Care | Payer: Self-pay | Source: Ambulatory Visit | Attending: Cardiology | Admitting: Cardiology

## 2013-11-28 ENCOUNTER — Ambulatory Visit (INDEPENDENT_AMBULATORY_CARE_PROVIDER_SITE_OTHER): Payer: Medicare Other | Admitting: *Deleted

## 2013-11-28 ENCOUNTER — Encounter: Payer: Self-pay | Admitting: Internal Medicine

## 2013-11-28 DIAGNOSIS — I4891 Unspecified atrial fibrillation: Secondary | ICD-10-CM

## 2013-11-28 DIAGNOSIS — Z5181 Encounter for therapeutic drug level monitoring: Secondary | ICD-10-CM

## 2013-11-28 LAB — POCT INR: INR: 1.8

## 2013-11-29 ENCOUNTER — Encounter (HOSPITAL_COMMUNITY)
Admission: RE | Admit: 2013-11-29 | Discharge: 2013-11-29 | Disposition: A | Discharge status: Home / Self Care | Payer: Self-pay | Source: Ambulatory Visit | Attending: Cardiology | Admitting: Cardiology

## 2013-11-30 ENCOUNTER — Encounter (HOSPITAL_COMMUNITY)
Admission: RE | Admit: 2013-11-30 | Discharge: 2013-11-30 | Disposition: A | Discharge status: Home / Self Care | Payer: Self-pay | Source: Ambulatory Visit | Attending: Cardiology | Admitting: Cardiology

## 2013-12-05 ENCOUNTER — Encounter (HOSPITAL_COMMUNITY)
Admission: RE | Admit: 2013-12-05 | Discharge: 2013-12-05 | Disposition: A | Discharge status: Home / Self Care | Payer: Self-pay | Source: Ambulatory Visit | Attending: Cardiology | Admitting: Cardiology

## 2013-12-05 DIAGNOSIS — I509 Heart failure, unspecified: Secondary | ICD-10-CM | POA: Insufficient documentation

## 2013-12-05 DIAGNOSIS — I2589 Other forms of chronic ischemic heart disease: Secondary | ICD-10-CM | POA: Insufficient documentation

## 2013-12-05 DIAGNOSIS — Z7902 Long term (current) use of antithrombotics/antiplatelets: Secondary | ICD-10-CM | POA: Insufficient documentation

## 2013-12-05 DIAGNOSIS — Z5189 Encounter for other specified aftercare: Secondary | ICD-10-CM | POA: Insufficient documentation

## 2013-12-05 DIAGNOSIS — I6529 Occlusion and stenosis of unspecified carotid artery: Secondary | ICD-10-CM | POA: Insufficient documentation

## 2013-12-05 DIAGNOSIS — Z951 Presence of aortocoronary bypass graft: Secondary | ICD-10-CM | POA: Insufficient documentation

## 2013-12-05 DIAGNOSIS — Z9861 Coronary angioplasty status: Secondary | ICD-10-CM | POA: Insufficient documentation

## 2013-12-05 DIAGNOSIS — E669 Obesity, unspecified: Secondary | ICD-10-CM | POA: Insufficient documentation

## 2013-12-05 DIAGNOSIS — I4891 Unspecified atrial fibrillation: Secondary | ICD-10-CM | POA: Insufficient documentation

## 2013-12-05 DIAGNOSIS — F17200 Nicotine dependence, unspecified, uncomplicated: Secondary | ICD-10-CM | POA: Insufficient documentation

## 2013-12-05 DIAGNOSIS — E785 Hyperlipidemia, unspecified: Secondary | ICD-10-CM | POA: Insufficient documentation

## 2013-12-05 DIAGNOSIS — I651 Occlusion and stenosis of basilar artery: Secondary | ICD-10-CM | POA: Insufficient documentation

## 2013-12-05 DIAGNOSIS — I252 Old myocardial infarction: Secondary | ICD-10-CM | POA: Insufficient documentation

## 2013-12-05 DIAGNOSIS — I2582 Chronic total occlusion of coronary artery: Secondary | ICD-10-CM | POA: Insufficient documentation

## 2013-12-05 DIAGNOSIS — I2510 Atherosclerotic heart disease of native coronary artery without angina pectoris: Secondary | ICD-10-CM | POA: Insufficient documentation

## 2013-12-05 DIAGNOSIS — I4892 Unspecified atrial flutter: Secondary | ICD-10-CM | POA: Insufficient documentation

## 2013-12-05 DIAGNOSIS — Z7982 Long term (current) use of aspirin: Secondary | ICD-10-CM | POA: Insufficient documentation

## 2013-12-05 DIAGNOSIS — E7800 Pure hypercholesterolemia, unspecified: Secondary | ICD-10-CM | POA: Insufficient documentation

## 2013-12-05 DIAGNOSIS — Z8249 Family history of ischemic heart disease and other diseases of the circulatory system: Secondary | ICD-10-CM | POA: Insufficient documentation

## 2013-12-05 DIAGNOSIS — K219 Gastro-esophageal reflux disease without esophagitis: Secondary | ICD-10-CM | POA: Insufficient documentation

## 2013-12-05 DIAGNOSIS — I10 Essential (primary) hypertension: Secondary | ICD-10-CM | POA: Insufficient documentation

## 2013-12-06 ENCOUNTER — Encounter (HOSPITAL_COMMUNITY)
Admission: RE | Admit: 2013-12-06 | Discharge: 2013-12-06 | Disposition: A | Discharge status: Home / Self Care | Payer: Self-pay | Source: Ambulatory Visit | Attending: Cardiology | Admitting: Cardiology

## 2013-12-07 ENCOUNTER — Encounter (HOSPITAL_COMMUNITY)
Admission: RE | Admit: 2013-12-07 | Discharge: 2013-12-07 | Disposition: A | Discharge status: Home / Self Care | Payer: Self-pay | Source: Ambulatory Visit | Attending: Cardiology | Admitting: Cardiology

## 2013-12-12 ENCOUNTER — Ambulatory Visit (INDEPENDENT_AMBULATORY_CARE_PROVIDER_SITE_OTHER): Payer: Medicare Other

## 2013-12-12 ENCOUNTER — Encounter (HOSPITAL_COMMUNITY)
Admission: RE | Admit: 2013-12-12 | Discharge: 2013-12-12 | Disposition: A | Discharge status: Home / Self Care | Payer: Self-pay | Source: Ambulatory Visit | Attending: Cardiology | Admitting: Cardiology

## 2013-12-12 DIAGNOSIS — I4891 Unspecified atrial fibrillation: Secondary | ICD-10-CM

## 2013-12-12 DIAGNOSIS — Z5181 Encounter for therapeutic drug level monitoring: Secondary | ICD-10-CM

## 2013-12-12 LAB — POCT INR: INR: 1.7

## 2013-12-13 ENCOUNTER — Encounter (HOSPITAL_COMMUNITY)
Admission: RE | Admit: 2013-12-13 | Discharge: 2013-12-13 | Disposition: A | Discharge status: Home / Self Care | Payer: Self-pay | Source: Ambulatory Visit | Attending: Cardiology | Admitting: Cardiology

## 2013-12-14 ENCOUNTER — Other Ambulatory Visit: Payer: Self-pay | Admitting: Cardiovascular Disease

## 2013-12-14 ENCOUNTER — Encounter (HOSPITAL_COMMUNITY): Payer: Medicare Other

## 2013-12-18 ENCOUNTER — Other Ambulatory Visit: Payer: Self-pay | Admitting: Pharmacist

## 2013-12-18 MED ORDER — WARFARIN SODIUM 2.5 MG PO TABS: ORAL_TABLET | ORAL | Status: DC

## 2013-12-19 ENCOUNTER — Encounter (HOSPITAL_COMMUNITY): Payer: Medicare Other

## 2013-12-19 ENCOUNTER — Other Ambulatory Visit: Payer: Self-pay | Admitting: Cardiovascular Disease

## 2013-12-20 ENCOUNTER — Encounter (HOSPITAL_COMMUNITY): Payer: Medicare Other

## 2013-12-21 ENCOUNTER — Encounter (HOSPITAL_COMMUNITY): Payer: Medicare Other

## 2013-12-21 ENCOUNTER — Other Ambulatory Visit: Payer: Self-pay

## 2013-12-21 MED ORDER — LOSARTAN POTASSIUM 50 MG PO TABS: ORAL_TABLET | ORAL | Status: DC

## 2013-12-26 ENCOUNTER — Encounter (HOSPITAL_COMMUNITY): Payer: Medicare Other

## 2013-12-27 ENCOUNTER — Encounter (HOSPITAL_COMMUNITY)
Admission: RE | Admit: 2013-12-27 | Discharge: 2013-12-27 | Disposition: A | Discharge status: Home / Self Care | Payer: Self-pay | Source: Ambulatory Visit | Attending: Cardiology | Admitting: Cardiology

## 2013-12-28 ENCOUNTER — Encounter (HOSPITAL_COMMUNITY): Payer: Medicare Other

## 2014-01-01 ENCOUNTER — Ambulatory Visit (INDEPENDENT_AMBULATORY_CARE_PROVIDER_SITE_OTHER): Payer: Medicare Other | Admitting: Cardiovascular Disease

## 2014-01-01 ENCOUNTER — Encounter: Payer: Self-pay | Admitting: Cardiovascular Disease

## 2014-01-01 VITALS — BP 140/90 | HR 59 | Ht 64.0 in | Wt 235.0 lb

## 2014-01-01 DIAGNOSIS — Z950 Presence of cardiac pacemaker: Secondary | ICD-10-CM

## 2014-01-01 DIAGNOSIS — I2510 Atherosclerotic heart disease of native coronary artery without angina pectoris: Secondary | ICD-10-CM

## 2014-01-01 DIAGNOSIS — I25810 Atherosclerosis of coronary artery bypass graft(s) without angina pectoris: Secondary | ICD-10-CM

## 2014-01-01 DIAGNOSIS — I10 Essential (primary) hypertension: Secondary | ICD-10-CM

## 2014-01-01 DIAGNOSIS — I4891 Unspecified atrial fibrillation: Secondary | ICD-10-CM

## 2014-01-01 DIAGNOSIS — E7800 Pure hypercholesterolemia, unspecified: Secondary | ICD-10-CM

## 2014-01-01 MED ORDER — AMLODIPINE BESYLATE 10 MG PO TABS: 10.0000 mg | ORAL_TABLET | Freq: Every day | ORAL | Status: DC

## 2014-01-01 MED ORDER — LOSARTAN POTASSIUM 50 MG PO TABS: ORAL_TABLET | ORAL | Status: DC

## 2014-01-01 MED ORDER — METOPROLOL SUCCINATE ER 100 MG PO TB24: 100.0000 mg | ORAL_TABLET | Freq: Every day | ORAL | Status: DC

## 2014-01-01 NOTE — Progress Notes (Signed)
HPI:  73 year old woman presenting for followup evaluation.  She has a history of coronary artery disease status post CABG and paroxysmal atrial fibrillation. She is maintained on long-term warfarin. She initially presented in 2011 with an acute anterior wall MI. She was treated with drug-eluting stents in the LAD but had severe residual multivessel disease and was ultimately treated with CABG. She required permanent pacemaker insertion. An echocardiogram done a few months after her CABG showed normal left ventricular systolic function.  Last lipids November 2013 showed cholesterol 241,) 175, HDL 47, and LDL 168. She has been intolerant to multiple statin drugs. She recalls trying atorvastatin and rosuvastatin at a dose of 5 mg. She states that "Lipitor darn near killed me." She has spinal stenosis but leg pain and weakness are markedly worse after about one week on a statin drug.  From a cardiac perspective she is doing okay. She denies chest pain, chest pressure, shortness of breath, edema, or palpitations. She has a long history of white coat hypertension. Blood pressure at cardiac rehabilitation has been very good. She has no cardiac- related symptoms with exertion.  Outpatient Encounter Prescriptions as of 01/01/2014  Medication Sig  . acetaminophen (TYLENOL) 500 MG tablet Take 500 mg by mouth as needed.    Marland Kitchen amLODipine (NORVASC) 10 MG tablet TAKE 1 TABLET BY MOUTH EVERY DAY  . aspirin 81 MG tablet Take 81 mg by mouth daily.    Marland Kitchen HYDROcodone-acetaminophen (NORCO) 5-325 MG per tablet Take 1 tablet by mouth every 6 (six) hours as needed.  Marland Kitchen losartan (COZAAR) 50 MG tablet TAKE ONE TABLET BY MOUTH ONCE DAILY  . metoprolol succinate (TOPROL-XL) 100 MG 24 hr tablet TAKE 1 TABLET BY MOUTH EVERY DAY  . pantoprazole (PROTONIX) 40 MG tablet TAKE 1 TABLET BY MOUTH ONCE DAILY  . warfarin (COUMADIN) 2.5 MG tablet Take as directed by Anticoagulation clinic  . [DISCONTINUED] methocarbamol (ROBAXIN) 500  MG tablet Take 500 mg by mouth 3 (three) times daily as needed.    No Known Allergies  Past Medical History  Diagnosis Date  . OBESITY   . CAD, ARTERY BYPASS GRAFT 11/2009  . LUNG NODULE     stable RLL nodule CT 04/2010 and 10/2010 and 03/2011  . 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 PAF (paroxysmal atrial fibrillation)     chronic anticoag  . MYOCARDIAL INFARCTION, ACUTE, ANTERIOR WALL 11/2009  . Hypertension   . HYPERCHOLESTEROLEMIA  IIA   . Depression   . ARTHRITIS, CERVICAL SPINE   . Thyroid nodule 2011    neg bx 01/2010    ROS: Negative except as per HPI  BP 140/90  Pulse 59  Ht 5\' 4"  (1.626 m)  Wt 235 lb (106.595 kg)  BMI 40.32 kg/m2  PHYSICAL EXAM: Pt is alert and oriented, NAD HEENT: normal Neck: JVP - normal, carotids 2+= without bruits Lungs: CTA bilaterally CV: RRR without murmur or gallop Abd: soft, NT, Positive BS, no hepatomegaly Ext: no C/C/E, distal pulses intact and equal Skin: warm/dry no rash  EKG:  Sinus rhythm 59 beats per minute, nonspecific ST and T wave abnormality.  ASSESSMENT AND PLAN: 1. Coronary artery disease, native vessel. She is status post CABG. She has no anginal symptoms and will be continued on her current medical program. This was reviewed and includes aspirin, beta blocker, and an ARB.  2. Paroxysmal atrial fibrillation. She is chronically anticoagulated with warfarin and remains in sinus rhythm on EKG.  She has no symptoms of palpitations.  3. Hypertension. Blood pressure is well controlled on amlodipine, losartan, and metoprolol succinate. She does have a long history of white coat hypertension.  4. Hyperlipidemia. Lipids from 2013 were reviewed. LDL is markedly elevated. Will repeat and refer her to the lipid clinic to see if she is a candidate for any clinical trials. Cost of medication is a significant issue.  Tonny BollmanMichael Noa Galvao 01/01/2014 4:06 PM

## 2014-01-01 NOTE — Patient Instructions (Signed)
Your physician recommends that you return for lab work on Wed. 3/4 at 0730 - fasting liver and cholesterol panels.  Do not eat or drink anything after midnight except water  [facility] - your appointment is Thurs. 3/12 at 10:00.  Your physician recommends that you continue on your current medications as directed. Please refer to the Current Medication list given to you today.  Your physician wants you to follow-up in: 1 year with Dr. Excell Seltzerooper.  You will receive a reminder letter in the mail two months in advance. If you don't receive a letter, please call our office to schedule the follow-up appointment.

## 2014-01-02 ENCOUNTER — Encounter (HOSPITAL_COMMUNITY)
Admission: RE | Admit: 2014-01-02 | Discharge: 2014-01-02 | Disposition: A | Discharge status: Home / Self Care | Payer: Self-pay | Source: Ambulatory Visit | Attending: Cardiovascular Disease | Admitting: Cardiovascular Disease

## 2014-01-02 ENCOUNTER — Ambulatory Visit (INDEPENDENT_AMBULATORY_CARE_PROVIDER_SITE_OTHER): Payer: Medicare Other | Admitting: *Deleted

## 2014-01-02 DIAGNOSIS — Z951 Presence of aortocoronary bypass graft: Secondary | ICD-10-CM | POA: Insufficient documentation

## 2014-01-02 DIAGNOSIS — Z5181 Encounter for therapeutic drug level monitoring: Secondary | ICD-10-CM

## 2014-01-02 DIAGNOSIS — I4891 Unspecified atrial fibrillation: Secondary | ICD-10-CM | POA: Insufficient documentation

## 2014-01-02 DIAGNOSIS — E7800 Pure hypercholesterolemia, unspecified: Secondary | ICD-10-CM | POA: Insufficient documentation

## 2014-01-02 DIAGNOSIS — I252 Old myocardial infarction: Secondary | ICD-10-CM | POA: Insufficient documentation

## 2014-01-02 DIAGNOSIS — I10 Essential (primary) hypertension: Secondary | ICD-10-CM | POA: Insufficient documentation

## 2014-01-02 DIAGNOSIS — I2510 Atherosclerotic heart disease of native coronary artery without angina pectoris: Secondary | ICD-10-CM | POA: Insufficient documentation

## 2014-01-02 DIAGNOSIS — Z5189 Encounter for other specified aftercare: Secondary | ICD-10-CM | POA: Insufficient documentation

## 2014-01-02 LAB — POCT INR: INR: 2.1

## 2014-01-03 ENCOUNTER — Ambulatory Visit (INDEPENDENT_AMBULATORY_CARE_PROVIDER_SITE_OTHER): Payer: Medicare Other | Admitting: *Deleted

## 2014-01-03 ENCOUNTER — Encounter (HOSPITAL_COMMUNITY)
Admission: RE | Admit: 2014-01-03 | Discharge: 2014-01-03 | Disposition: A | Discharge status: Home / Self Care | Payer: Self-pay | Source: Ambulatory Visit | Attending: Cardiovascular Disease | Admitting: Cardiovascular Disease

## 2014-01-03 DIAGNOSIS — I2510 Atherosclerotic heart disease of native coronary artery without angina pectoris: Secondary | ICD-10-CM

## 2014-01-03 DIAGNOSIS — I4891 Unspecified atrial fibrillation: Secondary | ICD-10-CM

## 2014-01-03 DIAGNOSIS — I10 Essential (primary) hypertension: Secondary | ICD-10-CM

## 2014-01-03 LAB — ALT: ALT: 20 U/L (ref 0–35)

## 2014-01-03 LAB — LIPID PANEL
CHOL/HDL RATIO: 4
CHOLESTEROL: 194 mg/dL (ref 0–200)
HDL: 45.8 mg/dL (ref >39.00)
LDL Cholesterol: 125 mg/dL — ABNORMAL HIGH (ref 0–99)
TRIGLYCERIDES: 117 mg/dL (ref 0.0–149.0)
VLDL: 23.4 mg/dL (ref 0.0–40.0)

## 2014-01-04 ENCOUNTER — Encounter (HOSPITAL_COMMUNITY): Payer: Self-pay

## 2014-01-09 ENCOUNTER — Encounter (HOSPITAL_COMMUNITY)
Admission: RE | Admit: 2014-01-09 | Discharge: 2014-01-09 | Disposition: A | Discharge status: Home / Self Care | Payer: Self-pay | Source: Ambulatory Visit | Attending: Cardiovascular Disease | Admitting: Cardiovascular Disease

## 2014-01-10 ENCOUNTER — Ambulatory Visit: Payer: 59 | Admitting: Cardiovascular Disease

## 2014-01-10 ENCOUNTER — Encounter (HOSPITAL_COMMUNITY)
Admission: RE | Admit: 2014-01-10 | Discharge: 2014-01-10 | Disposition: A | Discharge status: Home / Self Care | Payer: Self-pay | Source: Ambulatory Visit | Attending: Cardiovascular Disease | Admitting: Cardiovascular Disease

## 2014-01-11 ENCOUNTER — Ambulatory Visit (INDEPENDENT_AMBULATORY_CARE_PROVIDER_SITE_OTHER): Payer: Medicare Other | Admitting: Pharmacist

## 2014-01-11 ENCOUNTER — Encounter (HOSPITAL_COMMUNITY)
Admission: RE | Admit: 2014-01-11 | Discharge: 2014-01-11 | Disposition: A | Discharge status: Home / Self Care | Payer: Self-pay | Source: Ambulatory Visit | Attending: Cardiovascular Disease | Admitting: Cardiovascular Disease

## 2014-01-11 VITALS — Wt 235.0 lb

## 2014-01-11 DIAGNOSIS — E7800 Pure hypercholesterolemia, unspecified: Secondary | ICD-10-CM

## 2014-01-11 DIAGNOSIS — I2510 Atherosclerotic heart disease of native coronary artery without angina pectoris: Secondary | ICD-10-CM

## 2014-01-11 NOTE — Assessment & Plan Note (Signed)
Given h/o CABG, elevated LDL, and inability to tolerate daily statins, patient would like to try and enroll into PCSK-9 inhibitor study.  We discussed Zetia and Welchol as well, but she would prefer the study.  She understands it will be a placebo controlled study with a SQ injection every 2 weeks.  She is excited to get enrolled.  I will pass her information to the [facility] and they will hopefully be able to get her screened and enrolled in the near future.  She understands she won't get cholesterol checked at other offices once she is in the study.

## 2014-01-11 NOTE — Progress Notes (Signed)
Patient is a pleasant 73 y.o. WF referred to lipid clinic by Dr. Burt Knack given h/o CABG and inability to tolerate multiple lipid lowering therapies.  Patient has an MI in 2011, and found to have mutli-vessel disease, which ultimately was treated with CABG.  Her LDL earlier this month was 125 mg/dL, and as elevated at 168 mg/dL back in 09/2012.   She is currently working out 3 days a week, and plans on starting a low fat diet next week.  She is a previous patient of Dr. Lia Foyer.  Patient and I discussed such options as re-challenging statins, trying agents such as Zetia or Welchol, or enrolling into PCSK-9 inhibitor study.  We discussed PCSK-9 study at length.  She has true definition of statin intolerance met as she failed 2+ statins, one of which was at the lowest dose available.  She denies a history of cancer, and appears to meet all inclusion and exclusion criteria for the SPIRE-2 study.    RF:  CAD, HTN, low HDL, age - LDL goal < 70, non-HDL goal < 100 Meds:  Not on lipid lowering therapy currently. Intolerant:  Crestor 5 mg qd, Lipitor daily, Pravastatin 20 mg qd (all caused severe muscle aches)  Labs:   12/2013:  LDL 125, HDL 46, TG 117, TC 194, non-HDL 148 mg/dL (not on therapy)  Current Outpatient Prescriptions  Medication Sig Dispense Refill  . acetaminophen (TYLENOL) 500 MG tablet Take 500 mg by mouth as needed.        Marland Kitchen amLODipine (NORVASC) 10 MG tablet Take 1 tablet (10 mg total) by mouth daily.  90 tablet  3  . aspirin 81 MG tablet Take 81 mg by mouth daily.        Marland Kitchen HYDROcodone-acetaminophen (NORCO) 5-325 MG per tablet Take 1 tablet by mouth every 6 (six) hours as needed.      Marland Kitchen losartan (COZAAR) 50 MG tablet TAKE ONE TABLET BY MOUTH ONCE DAILY  90 tablet  3  . metoprolol succinate (TOPROL-XL) 100 MG 24 hr tablet Take 1 tablet (100 mg total) by mouth daily. Take with or immediately following a meal.  90 tablet  3  . pantoprazole (PROTONIX) 40 MG tablet TAKE 1 TABLET BY MOUTH ONCE DAILY   90 tablet  3  . warfarin (COUMADIN) 2.5 MG tablet Take as directed by Anticoagulation clinic  135 tablet  1   No current facility-administered medications for this visit.   Allergies  Allergen Reactions  . Crestor [Rosuvastatin]     Leg aches on 5 mg daily  . Lipitor [Atorvastatin]     Leg aches  . Pravastatin     Muscle aches on 20 mg daily   Family History  Problem Relation Age of Onset  . Other      Significant for premature cardiovascular disease affecting her father in his 63's  . Alcohol abuse      family hx addiction (parent)  . Arthritis      parent  . Hypertension      parent and other relative  . Cervical cancer Mother   . Hypothyroidism Daughter

## 2014-01-16 ENCOUNTER — Ambulatory Visit (INDEPENDENT_AMBULATORY_CARE_PROVIDER_SITE_OTHER): Payer: Medicare Other | Admitting: Pharmacist

## 2014-01-16 ENCOUNTER — Encounter (HOSPITAL_COMMUNITY)
Admission: RE | Admit: 2014-01-16 | Discharge: 2014-01-16 | Disposition: A | Discharge status: Home / Self Care | Payer: Self-pay | Source: Ambulatory Visit | Attending: Cardiovascular Disease | Admitting: Cardiovascular Disease

## 2014-01-16 DIAGNOSIS — Z5181 Encounter for therapeutic drug level monitoring: Secondary | ICD-10-CM

## 2014-01-16 DIAGNOSIS — I4891 Unspecified atrial fibrillation: Secondary | ICD-10-CM

## 2014-01-16 LAB — POCT INR: INR: 2.5

## 2014-01-17 ENCOUNTER — Encounter (HOSPITAL_COMMUNITY): Payer: Self-pay

## 2014-01-18 ENCOUNTER — Encounter (HOSPITAL_COMMUNITY)
Admission: RE | Admit: 2014-01-18 | Discharge: 2014-01-18 | Disposition: A | Discharge status: Home / Self Care | Payer: Self-pay | Source: Ambulatory Visit | Attending: Cardiovascular Disease | Admitting: Cardiovascular Disease

## 2014-01-23 ENCOUNTER — Encounter (HOSPITAL_COMMUNITY): Payer: Self-pay

## 2014-01-24 ENCOUNTER — Encounter (HOSPITAL_COMMUNITY): Payer: Self-pay

## 2014-01-25 ENCOUNTER — Encounter (HOSPITAL_COMMUNITY): Payer: Self-pay

## 2014-01-30 ENCOUNTER — Encounter (HOSPITAL_COMMUNITY): Payer: Self-pay

## 2014-01-31 ENCOUNTER — Encounter (HOSPITAL_COMMUNITY): Payer: 59

## 2014-01-31 DIAGNOSIS — I10 Essential (primary) hypertension: Secondary | ICD-10-CM | POA: Insufficient documentation

## 2014-01-31 DIAGNOSIS — I252 Old myocardial infarction: Secondary | ICD-10-CM | POA: Insufficient documentation

## 2014-01-31 DIAGNOSIS — Z951 Presence of aortocoronary bypass graft: Secondary | ICD-10-CM | POA: Insufficient documentation

## 2014-01-31 DIAGNOSIS — Z5189 Encounter for other specified aftercare: Secondary | ICD-10-CM | POA: Insufficient documentation

## 2014-01-31 DIAGNOSIS — E7800 Pure hypercholesterolemia, unspecified: Secondary | ICD-10-CM | POA: Insufficient documentation

## 2014-01-31 DIAGNOSIS — I4891 Unspecified atrial fibrillation: Secondary | ICD-10-CM | POA: Insufficient documentation

## 2014-01-31 DIAGNOSIS — I2510 Atherosclerotic heart disease of native coronary artery without angina pectoris: Secondary | ICD-10-CM | POA: Insufficient documentation

## 2014-02-01 ENCOUNTER — Encounter (HOSPITAL_COMMUNITY): Payer: Self-pay

## 2014-02-06 ENCOUNTER — Encounter (HOSPITAL_COMMUNITY): Payer: Self-pay

## 2014-02-07 ENCOUNTER — Encounter (HOSPITAL_COMMUNITY): Payer: Self-pay

## 2014-02-08 ENCOUNTER — Encounter (HOSPITAL_COMMUNITY): Payer: Self-pay

## 2014-02-13 ENCOUNTER — Encounter (HOSPITAL_COMMUNITY)
Admission: RE | Admit: 2014-02-13 | Discharge: 2014-02-13 | Disposition: A | Discharge status: Home / Self Care | Payer: Self-pay | Source: Ambulatory Visit | Attending: Cardiovascular Disease | Admitting: Cardiovascular Disease

## 2014-02-13 ENCOUNTER — Ambulatory Visit (INDEPENDENT_AMBULATORY_CARE_PROVIDER_SITE_OTHER): Payer: Medicare Other | Admitting: Pharmacist

## 2014-02-13 DIAGNOSIS — Z5181 Encounter for therapeutic drug level monitoring: Secondary | ICD-10-CM

## 2014-02-13 DIAGNOSIS — I4891 Unspecified atrial fibrillation: Secondary | ICD-10-CM

## 2014-02-13 LAB — POCT INR: INR: 2.6

## 2014-02-14 ENCOUNTER — Encounter (HOSPITAL_COMMUNITY): Payer: Self-pay

## 2014-02-15 ENCOUNTER — Encounter (HOSPITAL_COMMUNITY)
Admission: RE | Admit: 2014-02-15 | Discharge: 2014-02-15 | Disposition: A | Discharge status: Home / Self Care | Payer: Self-pay | Source: Ambulatory Visit | Attending: Cardiovascular Disease | Admitting: Cardiovascular Disease

## 2014-02-20 ENCOUNTER — Encounter (HOSPITAL_COMMUNITY)
Admission: RE | Admit: 2014-02-20 | Discharge: 2014-02-20 | Disposition: A | Discharge status: Home / Self Care | Payer: Self-pay | Source: Ambulatory Visit | Attending: Cardiovascular Disease | Admitting: Cardiovascular Disease

## 2014-02-21 ENCOUNTER — Encounter (HOSPITAL_COMMUNITY): Payer: Self-pay

## 2014-02-22 ENCOUNTER — Encounter (HOSPITAL_COMMUNITY)
Admission: RE | Admit: 2014-02-22 | Discharge: 2014-02-22 | Disposition: A | Discharge status: Home / Self Care | Payer: Self-pay | Source: Ambulatory Visit | Attending: Cardiovascular Disease | Admitting: Cardiovascular Disease

## 2014-02-25 ENCOUNTER — Other Ambulatory Visit: Payer: Self-pay | Admitting: Cardiovascular Disease

## 2014-02-27 ENCOUNTER — Encounter (HOSPITAL_COMMUNITY)
Admission: RE | Admit: 2014-02-27 | Discharge: 2014-02-27 | Disposition: A | Discharge status: Home / Self Care | Payer: Self-pay | Source: Ambulatory Visit | Attending: Cardiovascular Disease | Admitting: Cardiovascular Disease

## 2014-02-28 ENCOUNTER — Encounter (HOSPITAL_COMMUNITY): Payer: Self-pay

## 2014-03-01 ENCOUNTER — Encounter (HOSPITAL_COMMUNITY)
Admission: RE | Admit: 2014-03-01 | Discharge: 2014-03-01 | Disposition: A | Discharge status: Home / Self Care | Payer: Self-pay | Source: Ambulatory Visit | Attending: Cardiovascular Disease | Admitting: Cardiovascular Disease

## 2014-03-06 ENCOUNTER — Encounter (HOSPITAL_COMMUNITY)
Admission: RE | Admit: 2014-03-06 | Discharge: 2014-03-06 | Disposition: A | Discharge status: Home / Self Care | Payer: Self-pay | Source: Ambulatory Visit | Attending: Cardiovascular Disease | Admitting: Cardiovascular Disease

## 2014-03-06 DIAGNOSIS — I2510 Atherosclerotic heart disease of native coronary artery without angina pectoris: Secondary | ICD-10-CM | POA: Insufficient documentation

## 2014-03-06 DIAGNOSIS — I4891 Unspecified atrial fibrillation: Secondary | ICD-10-CM | POA: Insufficient documentation

## 2014-03-06 DIAGNOSIS — Z5189 Encounter for other specified aftercare: Secondary | ICD-10-CM | POA: Insufficient documentation

## 2014-03-06 DIAGNOSIS — E7800 Pure hypercholesterolemia, unspecified: Secondary | ICD-10-CM | POA: Insufficient documentation

## 2014-03-06 DIAGNOSIS — I252 Old myocardial infarction: Secondary | ICD-10-CM | POA: Insufficient documentation

## 2014-03-06 DIAGNOSIS — Z951 Presence of aortocoronary bypass graft: Secondary | ICD-10-CM | POA: Insufficient documentation

## 2014-03-06 DIAGNOSIS — I10 Essential (primary) hypertension: Secondary | ICD-10-CM | POA: Insufficient documentation

## 2014-03-07 ENCOUNTER — Encounter (HOSPITAL_COMMUNITY): Payer: Self-pay

## 2014-03-08 ENCOUNTER — Encounter (HOSPITAL_COMMUNITY): Payer: Self-pay

## 2014-03-13 ENCOUNTER — Ambulatory Visit (INDEPENDENT_AMBULATORY_CARE_PROVIDER_SITE_OTHER): Payer: Medicare Other | Admitting: Pharmacist

## 2014-03-13 ENCOUNTER — Encounter (HOSPITAL_COMMUNITY)
Admission: RE | Admit: 2014-03-13 | Discharge: 2014-03-13 | Disposition: A | Discharge status: Home / Self Care | Payer: Self-pay | Source: Ambulatory Visit | Attending: Cardiovascular Disease | Admitting: Cardiovascular Disease

## 2014-03-13 DIAGNOSIS — Z5181 Encounter for therapeutic drug level monitoring: Secondary | ICD-10-CM

## 2014-03-13 DIAGNOSIS — I4891 Unspecified atrial fibrillation: Secondary | ICD-10-CM

## 2014-03-13 LAB — POCT INR: INR: 2.1

## 2014-03-14 ENCOUNTER — Encounter (HOSPITAL_COMMUNITY): Payer: Self-pay

## 2014-03-15 ENCOUNTER — Encounter (HOSPITAL_COMMUNITY)
Admission: RE | Admit: 2014-03-15 | Discharge: 2014-03-15 | Disposition: A | Discharge status: Home / Self Care | Payer: Self-pay | Source: Ambulatory Visit | Attending: Cardiovascular Disease | Admitting: Cardiovascular Disease

## 2014-03-20 ENCOUNTER — Encounter (HOSPITAL_COMMUNITY)
Admission: RE | Admit: 2014-03-20 | Discharge: 2014-03-20 | Disposition: A | Discharge status: Home / Self Care | Payer: Self-pay | Source: Ambulatory Visit | Attending: Cardiovascular Disease | Admitting: Cardiovascular Disease

## 2014-03-21 ENCOUNTER — Encounter (HOSPITAL_COMMUNITY): Payer: Self-pay

## 2014-03-22 ENCOUNTER — Encounter (HOSPITAL_COMMUNITY)
Admission: RE | Admit: 2014-03-22 | Discharge: 2014-03-22 | Disposition: A | Discharge status: Home / Self Care | Payer: Self-pay | Source: Ambulatory Visit | Attending: Cardiovascular Disease | Admitting: Cardiovascular Disease

## 2014-03-27 ENCOUNTER — Encounter (HOSPITAL_COMMUNITY)
Admission: RE | Admit: 2014-03-27 | Discharge: 2014-03-27 | Disposition: A | Discharge status: Home / Self Care | Payer: Self-pay | Source: Ambulatory Visit | Attending: Cardiovascular Disease | Admitting: Cardiovascular Disease

## 2014-03-28 ENCOUNTER — Encounter (HOSPITAL_COMMUNITY): Payer: Self-pay

## 2014-03-29 ENCOUNTER — Encounter (HOSPITAL_COMMUNITY): Payer: Self-pay

## 2014-04-03 ENCOUNTER — Encounter (HOSPITAL_COMMUNITY)
Admission: RE | Admit: 2014-04-03 | Discharge: 2014-04-03 | Disposition: A | Discharge status: Home / Self Care | Payer: Self-pay | Source: Ambulatory Visit | Attending: Cardiovascular Disease | Admitting: Cardiovascular Disease

## 2014-04-03 DIAGNOSIS — I252 Old myocardial infarction: Secondary | ICD-10-CM | POA: Insufficient documentation

## 2014-04-03 DIAGNOSIS — E7800 Pure hypercholesterolemia, unspecified: Secondary | ICD-10-CM | POA: Insufficient documentation

## 2014-04-03 DIAGNOSIS — I2510 Atherosclerotic heart disease of native coronary artery without angina pectoris: Secondary | ICD-10-CM | POA: Insufficient documentation

## 2014-04-03 DIAGNOSIS — I4891 Unspecified atrial fibrillation: Secondary | ICD-10-CM | POA: Insufficient documentation

## 2014-04-03 DIAGNOSIS — Z5189 Encounter for other specified aftercare: Secondary | ICD-10-CM | POA: Insufficient documentation

## 2014-04-03 DIAGNOSIS — I10 Essential (primary) hypertension: Secondary | ICD-10-CM | POA: Insufficient documentation

## 2014-04-03 DIAGNOSIS — Z951 Presence of aortocoronary bypass graft: Secondary | ICD-10-CM | POA: Insufficient documentation

## 2014-04-04 ENCOUNTER — Encounter (HOSPITAL_COMMUNITY): Payer: Self-pay

## 2014-04-05 ENCOUNTER — Encounter (HOSPITAL_COMMUNITY): Payer: Self-pay

## 2014-04-10 ENCOUNTER — Encounter (HOSPITAL_COMMUNITY): Payer: Self-pay

## 2014-04-10 ENCOUNTER — Ambulatory Visit (INDEPENDENT_AMBULATORY_CARE_PROVIDER_SITE_OTHER): Payer: Medicare Other | Admitting: Interventional Cardiology

## 2014-04-10 DIAGNOSIS — I4891 Unspecified atrial fibrillation: Secondary | ICD-10-CM

## 2014-04-10 DIAGNOSIS — Z5181 Encounter for therapeutic drug level monitoring: Secondary | ICD-10-CM

## 2014-04-10 LAB — POCT INR: INR: 2.4

## 2014-04-11 ENCOUNTER — Encounter (HOSPITAL_COMMUNITY): Payer: Self-pay

## 2014-04-12 ENCOUNTER — Encounter (HOSPITAL_COMMUNITY): Payer: Self-pay

## 2014-04-17 ENCOUNTER — Encounter (HOSPITAL_COMMUNITY): Payer: Self-pay

## 2014-04-18 ENCOUNTER — Encounter (HOSPITAL_COMMUNITY): Payer: Self-pay

## 2014-04-19 ENCOUNTER — Encounter (HOSPITAL_COMMUNITY): Payer: Self-pay

## 2014-04-24 ENCOUNTER — Encounter (HOSPITAL_COMMUNITY): Payer: Self-pay

## 2014-04-25 ENCOUNTER — Encounter (HOSPITAL_COMMUNITY): Payer: Self-pay

## 2014-04-26 ENCOUNTER — Encounter (HOSPITAL_COMMUNITY): Payer: Self-pay

## 2014-05-01 ENCOUNTER — Encounter (HOSPITAL_COMMUNITY): Payer: Self-pay

## 2014-05-02 ENCOUNTER — Encounter (HOSPITAL_COMMUNITY): Payer: Self-pay

## 2014-05-03 ENCOUNTER — Encounter (HOSPITAL_COMMUNITY): Payer: Self-pay

## 2014-05-08 ENCOUNTER — Encounter (HOSPITAL_COMMUNITY): Payer: Self-pay

## 2014-05-09 ENCOUNTER — Encounter (HOSPITAL_COMMUNITY): Payer: Self-pay

## 2014-05-10 ENCOUNTER — Encounter (HOSPITAL_COMMUNITY): Payer: Self-pay

## 2014-05-15 ENCOUNTER — Encounter (HOSPITAL_COMMUNITY): Payer: Self-pay

## 2014-05-16 ENCOUNTER — Encounter (HOSPITAL_COMMUNITY): Payer: Self-pay

## 2014-05-17 ENCOUNTER — Encounter (HOSPITAL_COMMUNITY): Payer: Self-pay

## 2014-05-22 ENCOUNTER — Encounter (HOSPITAL_COMMUNITY): Payer: Self-pay

## 2014-05-22 ENCOUNTER — Ambulatory Visit (INDEPENDENT_AMBULATORY_CARE_PROVIDER_SITE_OTHER): Payer: Medicare Other | Admitting: *Deleted

## 2014-05-22 DIAGNOSIS — I4891 Unspecified atrial fibrillation: Secondary | ICD-10-CM

## 2014-05-22 DIAGNOSIS — Z5181 Encounter for therapeutic drug level monitoring: Secondary | ICD-10-CM

## 2014-05-22 LAB — POCT INR: INR: 2.5

## 2014-05-23 ENCOUNTER — Encounter (HOSPITAL_COMMUNITY): Payer: Self-pay

## 2014-05-24 ENCOUNTER — Encounter (HOSPITAL_COMMUNITY): Payer: Self-pay

## 2014-05-27 ENCOUNTER — Other Ambulatory Visit: Payer: Self-pay | Admitting: Cardiovascular Disease

## 2014-05-29 ENCOUNTER — Encounter (HOSPITAL_COMMUNITY): Payer: Self-pay

## 2014-05-30 ENCOUNTER — Encounter (HOSPITAL_COMMUNITY): Payer: Self-pay

## 2014-05-31 ENCOUNTER — Encounter (HOSPITAL_COMMUNITY): Payer: Self-pay

## 2014-06-05 ENCOUNTER — Encounter (HOSPITAL_COMMUNITY): Payer: Self-pay

## 2014-06-06 ENCOUNTER — Other Ambulatory Visit: Payer: Self-pay | Admitting: *Deleted

## 2014-06-06 ENCOUNTER — Encounter (HOSPITAL_COMMUNITY): Payer: Self-pay

## 2014-06-06 MED ORDER — AMBULATORY NON FORMULARY MEDICATION: 100.0000 mg | Status: DC

## 2014-06-07 ENCOUNTER — Encounter (HOSPITAL_COMMUNITY): Payer: Self-pay

## 2014-06-12 ENCOUNTER — Encounter (HOSPITAL_COMMUNITY): Payer: Self-pay

## 2014-06-13 ENCOUNTER — Encounter (HOSPITAL_COMMUNITY): Payer: Self-pay

## 2014-06-14 ENCOUNTER — Encounter (HOSPITAL_COMMUNITY): Payer: Self-pay

## 2014-06-19 ENCOUNTER — Encounter (HOSPITAL_COMMUNITY): Payer: Self-pay

## 2014-06-20 ENCOUNTER — Encounter (HOSPITAL_COMMUNITY): Payer: Self-pay

## 2014-06-21 ENCOUNTER — Encounter (HOSPITAL_COMMUNITY): Payer: Self-pay

## 2014-06-26 ENCOUNTER — Encounter (HOSPITAL_COMMUNITY): Payer: Self-pay

## 2014-06-27 ENCOUNTER — Encounter (HOSPITAL_COMMUNITY): Payer: Self-pay

## 2014-06-28 ENCOUNTER — Encounter (HOSPITAL_COMMUNITY): Payer: Self-pay

## 2014-07-03 ENCOUNTER — Ambulatory Visit (INDEPENDENT_AMBULATORY_CARE_PROVIDER_SITE_OTHER): Payer: Medicare Other | Admitting: *Deleted

## 2014-07-03 DIAGNOSIS — Z5181 Encounter for therapeutic drug level monitoring: Secondary | ICD-10-CM

## 2014-07-03 DIAGNOSIS — I4891 Unspecified atrial fibrillation: Secondary | ICD-10-CM

## 2014-07-03 LAB — POCT INR: INR: 2.6

## 2014-08-13 ENCOUNTER — Other Ambulatory Visit: Payer: Self-pay | Admitting: Cardiovascular Disease

## 2014-08-15 ENCOUNTER — Ambulatory Visit (INDEPENDENT_AMBULATORY_CARE_PROVIDER_SITE_OTHER): Payer: Medicare Other | Admitting: Pharmacist

## 2014-08-15 DIAGNOSIS — Z5181 Encounter for therapeutic drug level monitoring: Secondary | ICD-10-CM

## 2014-08-15 DIAGNOSIS — I4891 Unspecified atrial fibrillation: Secondary | ICD-10-CM

## 2014-08-15 LAB — POCT INR: INR: 3.6

## 2014-08-17 ENCOUNTER — Other Ambulatory Visit: Payer: Self-pay

## 2014-09-12 ENCOUNTER — Ambulatory Visit (INDEPENDENT_AMBULATORY_CARE_PROVIDER_SITE_OTHER): Payer: Medicare Other | Admitting: Surgery

## 2014-09-12 DIAGNOSIS — I4891 Unspecified atrial fibrillation: Secondary | ICD-10-CM

## 2014-09-12 DIAGNOSIS — Z5181 Encounter for therapeutic drug level monitoring: Secondary | ICD-10-CM

## 2014-09-12 LAB — POCT INR: INR: 2.4

## 2014-10-17 ENCOUNTER — Ambulatory Visit (INDEPENDENT_AMBULATORY_CARE_PROVIDER_SITE_OTHER): Payer: Medicare Other | Admitting: *Deleted

## 2014-10-17 DIAGNOSIS — I4891 Unspecified atrial fibrillation: Secondary | ICD-10-CM

## 2014-10-17 DIAGNOSIS — Z5181 Encounter for therapeutic drug level monitoring: Secondary | ICD-10-CM

## 2014-10-17 LAB — POCT INR: INR: 3

## 2014-11-21 ENCOUNTER — Ambulatory Visit (INDEPENDENT_AMBULATORY_CARE_PROVIDER_SITE_OTHER): Payer: Medicare Other | Admitting: *Deleted

## 2014-11-21 DIAGNOSIS — Z5181 Encounter for therapeutic drug level monitoring: Secondary | ICD-10-CM

## 2014-11-21 DIAGNOSIS — I4891 Unspecified atrial fibrillation: Secondary | ICD-10-CM

## 2014-11-21 LAB — POCT INR: INR: 2.6

## 2014-12-13 ENCOUNTER — Other Ambulatory Visit: Payer: Self-pay | Admitting: Cardiovascular Disease

## 2015-01-04 ENCOUNTER — Ambulatory Visit (INDEPENDENT_AMBULATORY_CARE_PROVIDER_SITE_OTHER): Payer: Medicare Other | Admitting: *Deleted

## 2015-01-04 ENCOUNTER — Encounter: Payer: Self-pay | Admitting: Cardiovascular Disease

## 2015-01-04 ENCOUNTER — Ambulatory Visit (INDEPENDENT_AMBULATORY_CARE_PROVIDER_SITE_OTHER): Payer: Medicare Other | Admitting: Cardiovascular Disease

## 2015-01-04 VITALS — BP 120/82 | HR 60 | Ht 64.0 in | Wt 235.8 lb

## 2015-01-04 DIAGNOSIS — I2510 Atherosclerotic heart disease of native coronary artery without angina pectoris: Secondary | ICD-10-CM | POA: Diagnosis not present

## 2015-01-04 DIAGNOSIS — I4891 Unspecified atrial fibrillation: Secondary | ICD-10-CM

## 2015-01-04 DIAGNOSIS — Z5181 Encounter for therapeutic drug level monitoring: Secondary | ICD-10-CM

## 2015-01-04 LAB — POCT INR: INR: 2.7

## 2015-01-04 NOTE — Patient Instructions (Addendum)
Your physician recommends that you continue on your current medications as directed. Please refer to the Current Medication list given to you today.  Your physician wants you to follow-up in: 1 year with Dr. Cooper.  You will receive a reminder letter in the mail two months in advance. If you don't receive a letter, please call our office to schedule the follow-up appointment.   

## 2015-01-04 NOTE — Progress Notes (Signed)
Patient ID: Erica Hood, female   DOB: 1941/01/08, 74 y.o.   MRN: [id number]    Cardiology Office Note   Date:  01/06/2015   ID:  [place], [place] Mar 12, 1941, MRN [id number]  PCP:  Rene Paci, MD  Cardiologist:  Tonny Bollman, MD    Chief Complaint  Patient presents with  . Coronary Artery Disease    sob at times but not when she exercises     History of Present Illness: Erica Hood is a 74 y.o. female who presents for follow up evaluation.  She has a history of CAD s/p CABG and paroxysmal atrial fibrillation on warfarin. She initially presented in 2011 with an acute anterior wall MI and was treated with DES to the LAD, but had severe residual multivessel disease and thus underwent CABG. She required permanent pacemaker insertion.   Last lipid profile in March 2015 revealed Chol 194, Trigly 117, HDL 46, LDL 125. She has been intolerant to multiple statins and is currently not on statin therapy.   Today, she notes mild dyspnea on exertion which is "newer." She states her dyspnea only occurs when walking or going up the stairs. She attributes her DOE to her spinal stenosis and having to remain leaning forward while ambulating. She states she does water aerobics 6 times per week and does not experience dyspnea during this time. No chest pain, lightheadedness, or edema.   Past Medical History  Diagnosis Date  . OBESITY   . CAD, ARTERY BYPASS GRAFT 11/2009  . LUNG NODULE     stable RLL nodule CT 04/2010 and 10/2010 and 03/2011  . 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 PAF (paroxysmal atrial fibrillation)     chronic anticoag  . MYOCARDIAL INFARCTION, ACUTE, ANTERIOR WALL 11/2009  . Hypertension   . HYPERCHOLESTEROLEMIA  IIA   . Depression   . ARTHRITIS, CERVICAL SPINE   . Thyroid nodule 2011    neg bx 01/2010    Past Surgical History  Procedure Laterality Date  . Coronary artery bypass graft  11-2009  . Cholecystectomy    . Appendectomy    . Breast  lumpectomy  1967    right breast (bening)  . Tonsillectomy    . Angioplasty      ans stenting of her LAD    Current Outpatient Prescriptions  Medication Sig Dispense Refill  . acetaminophen (TYLENOL) 500 MG tablet Take 500 mg by mouth as needed.      . AMBULATORY NON FORMULARY MEDICATION Inject 100 mg into the skin every 14 (fourteen) days. Medication Name:Research Study Drug - bococizumab 100 mgs vs placebo    . amLODipine (NORVASC) 10 MG tablet Take 1 tablet (10 mg total) by mouth daily. 90 tablet 3  . aspirin 81 MG tablet Take 81 mg by mouth daily.      Marland Kitchen HYDROcodone-acetaminophen (NORCO) 5-325 MG per tablet Take 1 tablet by mouth every 6 (six) hours as needed.    Marland Kitchen losartan (COZAAR) 50 MG tablet TAKE ONE TABLET BY MOUTH ONCE DAILY 90 tablet 3  . metoprolol succinate (TOPROL-XL) 100 MG 24 hr tablet TAKE 1 TABLET BY MOUTH EVERY DAY WITH OR IMMEDIATELY FOLLOWING A MEAL 90 tablet 1  . pantoprazole (PROTONIX) 40 MG tablet TAKE 1 TABLET BY MOUTH DAILY 90 tablet 3  . warfarin (COUMADIN) 2.5 MG tablet TAKE BY MOUTH AS DIRECTED BY ANTICOAGULATION CLINIC 135 tablet 1   No current facility-administered medications for this visit.  Allergies:   Crestor; Lipitor; and Pravastatin   Social History:  The patient  reports that she quit smoking about 5 years ago. She does not have any smokeless tobacco history on file. She reports that she does not drink alcohol or use illicit drugs.   Family History:  The patient's  family history includes Alcohol abuse in an other family member; Arthritis in an other family member; Cervical cancer in her mother; Hypertension in an other family member; Hypothyroidism in her daughter; Other in an other family member.    ROS:  Please see the history of present illness.  Otherwise, review of systems is positive for leg swelling, exertional dyspnea, back pain, leg pain/heaviness, heart palpitations, joint swelling.  All other systems are reviewed and negative.     PHYSICAL EXAM: VS:  BP 120/82 mmHg  Pulse 60  Ht 5\' 4"  (1.626 m)  Wt 235 lb 12.8 oz (106.958 kg)  BMI 40.45 kg/m2  SpO2 97% , BMI Body mass index is 40.45 kg/(m^2). GEN: Well nourished, well developed, in no acute distress HEENT: normal Neck: no JVD, no masses, no carotid bruits Cardiac: RRR without murmur or gallop                Respiratory:  clear to auscultation bilaterally, normal work of breathing GI: soft, nontender, nondistended, + BS MS: no deformity or atrophy Ext: no pretibial edema Skin: warm and dry, no rash Neuro:  Strength and sensation are intact Psych: euthymic mood, full affect  EKG:  EKG is not ordered today.  Recent Labs: No results found for requested labs within last 365 days.   Lipid Panel     Component Value Date/Time   CHOL 194 01/03/2014 0743   TRIG 117.0 01/03/2014 0743   HDL 45.80 01/03/2014 0743   CHOLHDL 4 01/03/2014 0743   VLDL 23.4 01/03/2014 0743   LDLCALC 125* 01/03/2014 0743   LDLDIRECT 167.7 09/06/2012 1118      Wt Readings from Last 3 Encounters:  01/04/15 235 lb 12.8 oz (106.958 kg)  01/11/14 235 lb (106.595 kg)  01/01/14 235 lb (106.595 kg)    ASSESSMENT AND PLAN: 1.  CAD, native vessel: Patient is status post CABG without symptoms of angina. Will continue her current medical program which was reviewed today.  2. Paroxysmal atrial fibrillation: The patient is tolerating chronic anticoagulation with warfarin. She reports no bleeding problems. Continue current strategy.  3. Essential hypertension: Blood pressure well controlled on multidrug therapy.  4. Hyperlipidemia: Last lipids reviewed. The patient is now enrolled in a PCSK9 inhibitor trial.   Current medicines are reviewed with the patient today.  The patient does not have concerns regarding medicines.  The following changes have been made:  no change  Labs/ tests ordered today include:  No orders of the defined types were placed in this encounter.     Disposition:   FU one year  Signed, Tonny BollmanMichael Kenyan Karnes, MD  72/04/2015 10:22 PM    [facility] [address], [place], [place]  [address] Phone: [phone]; Fax: [phone]

## 2015-01-16 ENCOUNTER — Telehealth: Payer: Self-pay

## 2015-01-16 NOTE — Telephone Encounter (Signed)
Left message for pt to call back if he wants flu shot 

## 2015-02-05 ENCOUNTER — Other Ambulatory Visit: Payer: Self-pay | Admitting: *Deleted

## 2015-02-05 ENCOUNTER — Encounter: Payer: Self-pay | Admitting: *Deleted

## 2015-02-05 NOTE — Progress Notes (Signed)
Patient ID: Erica Hood, female   DOB: 1940/11/30, 74 y.o.   MRN: [id number] SPIRE  Informed Consent   Subject Name:  Erica Hood Subject met inclusion and exclusion criteria. The informed consent form, study requirements and expectations were reviewed with the subject and questions and concerns were addressed prior to the signing of the consent form. The subject verbalized understanding of the trail requirements. The subject agreed to participate in the [facility] trial and signed the informed consent. The informed consent was obtained prior to performance of any protocol-specific procedures for the subject. A copy of the signed informed consent was given to the subject and a copy was placed in the subject's medical record.   Jake Bathe, RN 01/31/2015

## 2015-02-05 NOTE — Progress Notes (Deleted)
   Patient ID: Erica Hood, female    DOB: [birth year], 74 y.o.   MRN: [id number]  HPI    Review of Systems    Physical Exam

## 2015-02-19 ENCOUNTER — Encounter: Payer: Self-pay | Admitting: *Deleted

## 2015-02-19 NOTE — Progress Notes (Signed)
Subject re- consented to Main Informed Consent Version 5.0 and addendum Version 2.0.  IRB approval date 14Mar2016, 

## 2015-03-21 ENCOUNTER — Ambulatory Visit (INDEPENDENT_AMBULATORY_CARE_PROVIDER_SITE_OTHER): Payer: Medicare Other | Admitting: *Deleted

## 2015-03-21 DIAGNOSIS — I4891 Unspecified atrial fibrillation: Secondary | ICD-10-CM | POA: Diagnosis not present

## 2015-03-21 DIAGNOSIS — Z5181 Encounter for therapeutic drug level monitoring: Secondary | ICD-10-CM | POA: Diagnosis not present

## 2015-03-21 LAB — POCT INR: INR: 2.4

## 2015-03-23 ENCOUNTER — Other Ambulatory Visit: Payer: Self-pay | Admitting: Cardiovascular Disease

## 2015-04-26 ENCOUNTER — Ambulatory Visit (INDEPENDENT_AMBULATORY_CARE_PROVIDER_SITE_OTHER): Payer: Medicare Other

## 2015-04-26 DIAGNOSIS — I4891 Unspecified atrial fibrillation: Secondary | ICD-10-CM | POA: Diagnosis not present

## 2015-04-26 DIAGNOSIS — Z5181 Encounter for therapeutic drug level monitoring: Secondary | ICD-10-CM

## 2015-04-26 LAB — POCT INR: INR: 2.7

## 2015-04-29 ENCOUNTER — Other Ambulatory Visit: Payer: Self-pay

## 2015-06-07 ENCOUNTER — Ambulatory Visit (INDEPENDENT_AMBULATORY_CARE_PROVIDER_SITE_OTHER): Payer: Medicare Other | Admitting: *Deleted

## 2015-06-07 DIAGNOSIS — I4891 Unspecified atrial fibrillation: Secondary | ICD-10-CM

## 2015-06-07 DIAGNOSIS — Z5181 Encounter for therapeutic drug level monitoring: Secondary | ICD-10-CM

## 2015-06-07 LAB — POCT INR: INR: 3.2

## 2015-06-21 ENCOUNTER — Other Ambulatory Visit: Payer: Self-pay | Admitting: Cardiovascular Disease

## 2015-07-05 ENCOUNTER — Ambulatory Visit (INDEPENDENT_AMBULATORY_CARE_PROVIDER_SITE_OTHER): Payer: Medicare Other | Admitting: *Deleted

## 2015-07-05 DIAGNOSIS — I4891 Unspecified atrial fibrillation: Secondary | ICD-10-CM | POA: Diagnosis not present

## 2015-07-05 DIAGNOSIS — Z5181 Encounter for therapeutic drug level monitoring: Secondary | ICD-10-CM

## 2015-07-05 LAB — POCT INR: INR: 3.5

## 2015-07-09 ENCOUNTER — Other Ambulatory Visit: Payer: Self-pay | Admitting: Cardiovascular Disease

## 2015-07-15 ENCOUNTER — Ambulatory Visit (INDEPENDENT_AMBULATORY_CARE_PROVIDER_SITE_OTHER): Payer: Medicare Other | Admitting: *Deleted

## 2015-07-15 DIAGNOSIS — Z950 Presence of cardiac pacemaker: Secondary | ICD-10-CM

## 2015-07-15 DIAGNOSIS — I4891 Unspecified atrial fibrillation: Secondary | ICD-10-CM

## 2015-07-15 LAB — CUP PACEART INCLINIC DEVICE CHECK
Brady Statistic RV Percent Paced: 0.1 %
Date Time Interrogation Session: 20160912093652
Lead Channel Impedance Value: 400 Ohm
Lead Channel Impedance Value: 400 Ohm
Lead Channel Pacing Threshold Amplitude: 0.75 V
Lead Channel Pacing Threshold Amplitude: 1 V
Lead Channel Pacing Threshold Pulse Width: 0.4 ms
Lead Channel Pacing Threshold Pulse Width: 0.4 ms
Lead Channel Pacing Threshold Pulse Width: 0.4 ms
Lead Channel Sensing Intrinsic Amplitude: 3.4 mV
Lead Channel Setting Pacing Amplitude: 1.125
Lead Channel Setting Pacing Amplitude: 1.625
Lead Channel Setting Pacing Pulse Width: 0.4 ms
MDC IDC MSMT BATTERY REMAINING LONGEVITY: 124.8 mo
MDC IDC MSMT BATTERY VOLTAGE: 2.93 V
MDC IDC MSMT LEADCHNL RA PACING THRESHOLD AMPLITUDE: 0.75 V
MDC IDC MSMT LEADCHNL RA PACING THRESHOLD PULSEWIDTH: 0.4 ms
MDC IDC MSMT LEADCHNL RV PACING THRESHOLD AMPLITUDE: 1 V
MDC IDC MSMT LEADCHNL RV SENSING INTR AMPL: 12 mV
MDC IDC SET LEADCHNL RV SENSING SENSITIVITY: 2 mV
MDC IDC STAT BRADY RA PERCENT PACED: 24 %
Pulse Gen Model: 2110
Pulse Gen Serial Number: 7107049

## 2015-07-15 NOTE — Progress Notes (Signed)
Pacemaker check in clinic. Normal device function. Thresholds, sensing, impedances consistent with previous measurements. Device programmed to maximize longevity. 5 mode switches- longest 16 seconds, pk A 366 (noise). EGMs show other AMS are AT/AFlutter. No high ventricular rates noted. Device programmed at appropriate safety margins. Histogram distribution appropriate for patient activity level. Device programmed to optimize intrinsic conduction. Estimated longevity 9-10.4 years. ROV with GT 10/18/15.

## 2015-07-25 ENCOUNTER — Ambulatory Visit (INDEPENDENT_AMBULATORY_CARE_PROVIDER_SITE_OTHER): Payer: Medicare Other | Admitting: Pharmacist

## 2015-07-25 DIAGNOSIS — Z5181 Encounter for therapeutic drug level monitoring: Secondary | ICD-10-CM

## 2015-07-25 DIAGNOSIS — I4891 Unspecified atrial fibrillation: Secondary | ICD-10-CM | POA: Diagnosis not present

## 2015-07-25 LAB — POCT INR: INR: 2.2

## 2015-08-20 ENCOUNTER — Encounter: Payer: Self-pay | Admitting: Internal Medicine

## 2015-08-23 ENCOUNTER — Telehealth: Payer: Self-pay | Admitting: Cardiovascular Disease

## 2015-08-23 ENCOUNTER — Other Ambulatory Visit: Payer: Self-pay | Admitting: Rehabilitation

## 2015-08-23 ENCOUNTER — Telehealth: Payer: Self-pay | Admitting: Internal Medicine

## 2015-08-23 DIAGNOSIS — M5136 Other intervertebral disc degeneration, lumbar region: Secondary | ICD-10-CM

## 2015-08-23 NOTE — Telephone Encounter (Signed)
New message      Request for surgical clearance:  What type of surgery is being performed? myelogram 1. When is this surgery scheduled? Pending clearance  2. Are there any medications that need to be held prior to surgery and how long? Stop warfarin 5 days prior  3. Name of physician performing surgery? Dr Wendie ChessSullo  4. What is your office phone and fax number? Fax [phone]-----office closes at 3pm today----they need something in writing

## 2015-08-26 ENCOUNTER — Ambulatory Visit (INDEPENDENT_AMBULATORY_CARE_PROVIDER_SITE_OTHER): Payer: Medicare Other

## 2015-08-26 DIAGNOSIS — I4891 Unspecified atrial fibrillation: Secondary | ICD-10-CM

## 2015-08-26 DIAGNOSIS — Z5181 Encounter for therapeutic drug level monitoring: Secondary | ICD-10-CM | POA: Diagnosis not present

## 2015-08-26 LAB — POCT INR: INR: 2.4

## 2015-08-28 ENCOUNTER — Other Ambulatory Visit: Payer: Self-pay | Admitting: Cardiovascular Disease

## 2015-08-28 NOTE — Telephone Encounter (Signed)
Telephone encounter faxed to [phone].

## 2015-08-28 NOTE — Telephone Encounter (Signed)
Chart reviewed. Patient is at low risk of stopping warfarin for 5 days prior to her procedure.

## 2015-09-05 ENCOUNTER — Ambulatory Visit (INDEPENDENT_AMBULATORY_CARE_PROVIDER_SITE_OTHER): Payer: Medicare Other | Admitting: *Deleted

## 2015-09-05 DIAGNOSIS — I4891 Unspecified atrial fibrillation: Secondary | ICD-10-CM

## 2015-09-05 DIAGNOSIS — Z5181 Encounter for therapeutic drug level monitoring: Secondary | ICD-10-CM | POA: Diagnosis not present

## 2015-09-05 LAB — POCT INR: INR: 1

## 2015-09-06 ENCOUNTER — Ambulatory Visit
Admission: RE | Admit: 2015-09-06 | Discharge: 2015-09-06 | Disposition: A | Discharge status: Home / Self Care | Payer: Medicare Other | Source: Ambulatory Visit | Attending: Rehabilitation | Admitting: Rehabilitation

## 2015-09-06 ENCOUNTER — Encounter: Payer: Self-pay | Admitting: *Deleted

## 2015-09-06 VITALS — BP 203/95 | HR 61

## 2015-09-06 DIAGNOSIS — M5136 Other intervertebral disc degeneration, lumbar region: Secondary | ICD-10-CM

## 2015-09-06 DIAGNOSIS — Z006 Encounter for examination for normal comparison and control in clinical research program: Secondary | ICD-10-CM

## 2015-09-06 DIAGNOSIS — M5416 Radiculopathy, lumbar region: Secondary | ICD-10-CM

## 2015-09-06 MED ORDER — IOHEXOL 180 MG/ML  SOLN
20.0000 mL | Freq: Once | INTRAMUSCULAR | Status: DC | PRN
  Administered 2015-09-06: 20 mL via INTRATHECAL

## 2015-09-06 MED ORDER — DIAZEPAM 5 MG PO TABS: 5.0000 mg | ORAL_TABLET | Freq: Once | ORAL | Status: DC

## 2015-09-06 NOTE — Progress Notes (Signed)
Pt states she has been off Warfarin, INR is 1.0. Discharge instructions explained to pt.

## 2015-09-06 NOTE — Progress Notes (Signed)
Informational telephone call to inform patient that the SPIRE I and SPIRE II Research study has come to a close and NOT to take any more injections of the investigational Product. Patient states her last dose was last Thursday October 28th. Informed patient that we will be contacting patient next week to schedule End of study visit. Questions encouraged and answered.

## 2015-09-06 NOTE — Discharge Instructions (Signed)

## 2015-09-12 ENCOUNTER — Emergency Department (HOSPITAL_COMMUNITY): Payer: Medicare Other

## 2015-09-12 ENCOUNTER — Telehealth: Payer: Self-pay | Admitting: Pharmacist

## 2015-09-12 ENCOUNTER — Encounter (HOSPITAL_COMMUNITY): Payer: Self-pay | Admitting: Emergency Medicine

## 2015-09-12 ENCOUNTER — Emergency Department (HOSPITAL_COMMUNITY)
Admission: EM | Admit: 2015-09-12 | Discharge: 2015-09-12 | Disposition: A | Discharge status: Home / Self Care | Payer: Medicare Other | Attending: Emergency Medicine | Admitting: Emergency Medicine

## 2015-09-12 DIAGNOSIS — R002 Palpitations: Secondary | ICD-10-CM | POA: Diagnosis present

## 2015-09-12 DIAGNOSIS — F329 Major depressive disorder, single episode, unspecified: Secondary | ICD-10-CM | POA: Diagnosis not present

## 2015-09-12 DIAGNOSIS — M1990 Unspecified osteoarthritis, unspecified site: Secondary | ICD-10-CM | POA: Insufficient documentation

## 2015-09-12 DIAGNOSIS — I480 Paroxysmal atrial fibrillation: Secondary | ICD-10-CM | POA: Diagnosis not present

## 2015-09-12 DIAGNOSIS — I10 Essential (primary) hypertension: Secondary | ICD-10-CM | POA: Diagnosis not present

## 2015-09-12 DIAGNOSIS — Z87891 Personal history of nicotine dependence: Secondary | ICD-10-CM | POA: Diagnosis not present

## 2015-09-12 DIAGNOSIS — I2510 Atherosclerotic heart disease of native coronary artery without angina pectoris: Secondary | ICD-10-CM | POA: Diagnosis not present

## 2015-09-12 DIAGNOSIS — Z79899 Other long term (current) drug therapy: Secondary | ICD-10-CM | POA: Diagnosis not present

## 2015-09-12 DIAGNOSIS — E669 Obesity, unspecified: Secondary | ICD-10-CM | POA: Diagnosis not present

## 2015-09-12 DIAGNOSIS — Z7982 Long term (current) use of aspirin: Secondary | ICD-10-CM | POA: Insufficient documentation

## 2015-09-12 DIAGNOSIS — I252 Old myocardial infarction: Secondary | ICD-10-CM | POA: Insufficient documentation

## 2015-09-12 LAB — COMPREHENSIVE METABOLIC PANEL
ALBUMIN: 3.9 g/dL (ref 3.5–5.0)
ALK PHOS: 99 U/L (ref 38–126)
ALT: 22 U/L (ref 14–54)
ANION GAP: 11 (ref 5–15)
AST: 27 U/L (ref 15–41)
BILIRUBIN TOTAL: 0.6 mg/dL (ref 0.3–1.2)
BUN: 18 mg/dL (ref 6–20)
CALCIUM: 9.6 mg/dL (ref 8.9–10.3)
CO2: 25 mmol/L (ref 22–32)
CREATININE: 0.86 mg/dL (ref 0.44–1.00)
Chloride: 101 mmol/L (ref 101–111)
GFR calc Af Amer: >60 mL/min (ref >60)
GFR calc non Af Amer: >60 mL/min (ref >60)
GLUCOSE: 151 mg/dL — AB (ref 65–99)
Potassium: 3.8 mmol/L (ref 3.5–5.1)
SODIUM: 137 mmol/L (ref 135–145)
TOTAL PROTEIN: 7.8 g/dL (ref 6.5–8.1)

## 2015-09-12 LAB — CBC WITH DIFFERENTIAL/PLATELET
BASOS ABS: 0 10*3/uL (ref 0.0–0.1)
BASOS PCT: 0 %
EOS ABS: 0.1 10*3/uL (ref 0.0–0.7)
Eosinophils Relative: 1 %
HEMATOCRIT: 42.9 % (ref 36.0–46.0)
HEMOGLOBIN: 14.1 g/dL (ref 12.0–15.0)
Lymphocytes Relative: 47 %
Lymphs Abs: 2.5 10*3/uL (ref 0.7–4.0)
MCH: 30.4 pg (ref 26.0–34.0)
MCHC: 32.9 g/dL (ref 30.0–36.0)
MCV: 92.5 fL (ref 78.0–100.0)
MONOS PCT: 9 %
Monocytes Absolute: 0.5 10*3/uL (ref 0.1–1.0)
NEUTROS ABS: 2.2 10*3/uL (ref 1.7–7.7)
NEUTROS PCT: 43 %
Platelets: 287 10*3/uL (ref 150–400)
RBC: 4.64 MIL/uL (ref 3.87–5.11)
RDW: 13.1 % (ref 11.5–15.5)
WBC: 5.3 10*3/uL (ref 4.0–10.5)

## 2015-09-12 LAB — TROPONIN I
Troponin I: <0.03 ng/mL (ref <0.031)
Troponin I: <0.03 ng/mL (ref <0.031)

## 2015-09-12 LAB — TSH: TSH: 2.346 u[IU]/mL (ref 0.350–4.500)

## 2015-09-12 LAB — PROTIME-INR
INR: 1.54 — AB (ref 0.00–1.49)
Prothrombin Time: 18.5 seconds — ABNORMAL HIGH (ref 11.6–15.2)

## 2015-09-12 NOTE — ED Notes (Signed)
Pt reports she has hx of afib and this morning around 630 she began to feel her heart rhythm change along with slight discomfort.

## 2015-09-12 NOTE — Discharge Instructions (Signed)
I'm requesting that you take 5 mg of Coumadin daily today, Friday and Saturday. Then continue taken as scheduled previously and follow-up for INR check as detailed above. I think her symptoms are likely related to paroxysmal atrial fibrillation however if you have significant chest tightness or shortness of breath or other pains please return to the emergency Department immediately. You have an appointment set up with Dr. Ladona Ridgelaylor as detailed above as well. I would decrease your caffeine intake and exercise until evaluated by her cardiologist.

## 2015-09-12 NOTE — Telephone Encounter (Signed)
Pt called to say she was in the hospital for afib and they checked her INR this morning (Wednesday 11/10). INR was 1.54, goal 2-3. Pt had appt scheduled in Coumadin clinic for tomorrow 11/11, also reports that she was told to boost her Coumadin and take 5mg  today, tomorrow, and Saturday, then resume her usual dose. Agree with this plan. Will f/u with INR in clinic next Wednesday. Pt verbalized understanding.

## 2015-09-12 NOTE — ED Provider Notes (Signed)
CSN: [id number]     Arrival date & time 09/12/15  0734 History   First MD Initiated Contact with Patient 09/12/15 0741     No chief complaint on file.    (Consider location/radiation/quality/duration/timing/severity/associated sxs/prior Treatment) Patient is a 74 y.o. female presenting with palpitations.  Palpitations Palpitations quality:  Irregular Onset quality:  Sudden Duration:  1 hour Chronicity:  Recurrent Associated symptoms: chest pain and shortness of breath (baseline)   Associated symptoms: no back pain, no cough, no nausea and no vomiting    74 year old female history of paroxysmal age of fibrillation on Coumadin Amer syrup also with a pacemaker secondary to sick sinus syndrome that she obtained from a heart attack in 2011.also had CABG in 2011, hypertension, hyperlipidemia and hypothyroidism.she is here with approximately 1 hour of dictations, chest tightness and slight lightheadedness. She describes the chest tightness as "wiggly". No radiation, nausea or diaphoresis. Has had episodes like this before but they usually shorter acting. This started after exercise while she was drinking coffee. But these are normal daily routines for which she has not had a problem before. No recent illnesses. No recent travels. Did have a spinal myelogram last week but no other new history.  Past Medical History  Diagnosis Date  . OBESITY   . CAD, ARTERY BYPASS GRAFT 11/2009  . LUNG NODULE     stable RLL nodule CT 04/2010 and 10/2010 and 03/2011  . 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 PAF (paroxysmal atrial fibrillation)     chronic anticoag  . MYOCARDIAL INFARCTION, ACUTE, ANTERIOR WALL 11/2009  . Hypertension   . HYPERCHOLESTEROLEMIA  IIA   . Depression   . ARTHRITIS, CERVICAL SPINE   . Thyroid nodule 2011    neg bx 01/2010   Past Surgical History  Procedure Laterality Date  . Coronary artery bypass graft  11-2009  . Cholecystectomy    . Appendectomy    . Breast  lumpectomy  1967    right breast (bening)  . Tonsillectomy    . Angioplasty      ans stenting of her LAD   Family History  Problem Relation Age of Onset  . Other      Significant for premature cardiovascular disease affecting her father in his 72's  . Alcohol abuse      family hx addiction (parent)  . Arthritis      parent  . Hypertension      parent and other relative  . Cervical cancer Mother   . Hypothyroidism Daughter    Social History  Substance Use Topics  . Smoking status: Former Smoker    Quit date: 11/02/2009  . Smokeless tobacco: Not on file  . Alcohol Use: No   OB History    No data available     Review of Systems  Constitutional: Negative for fever and chills.  HENT: Negative for congestion.   Eyes: Negative for photophobia and pain.  Respiratory: Positive for shortness of breath (baseline). Negative for cough.   Cardiovascular: Positive for chest pain and palpitations.  Gastrointestinal: Negative for nausea and vomiting.  Musculoskeletal: Negative for back pain and neck pain.  Skin: Negative for rash.  All other systems reviewed and are negative.     Allergies  Crestor; Lipitor; and Pravastatin  Home Medications   Prior to Admission medications   Medication Sig Start Date End Date Taking? Authorizing Provider  acetaminophen (TYLENOL) 500 MG tablet Take 500 mg by mouth as needed.  Historical Provider, MD  AMBULATORY NON FORMULARY MEDICATION Inject 100 mg into the skin every 14 (fourteen) days. Medication Name:Research Study Drug - bococizumab 100 mgs vs placebo 06/06/14   Herby Abrahamhomas D Stuckey, MD  amLODipine (NORVASC) 10 MG tablet TAKE 1 TABLET BY MOUTH EVERY DAY 03/25/15   Tonny BollmanMichael Cooper, MD  aspirin 81 MG tablet Take 81 mg by mouth daily.      Historical Provider, MD  HYDROcodone-acetaminophen (NORCO) 5-325 MG per tablet Take 1 tablet by mouth every 6 (six) hours as needed.    Historical Provider, MD  losartan (COZAAR) 50 MG tablet TAKE 1 TABLET BY  MOUTH EVERY DAY 03/25/15   Tonny BollmanMichael Cooper, MD  metoprolol succinate (TOPROL-XL) 100 MG 24 hr tablet TAKE 1 TABLET BY MOUTH EVERY DAY WITH OR IMMEDIATELY FOLLOWING A MEAL 06/24/15   Tonny BollmanMichael Cooper, MD  pantoprazole (PROTONIX) 40 MG tablet TAKE 1 TABLET BY MOUTH EVERY DAY 08/29/15   Tonny BollmanMichael Cooper, MD  warfarin (COUMADIN) 2.5 MG tablet TAKE AS DIRECTED BY ANTICOAGULATION CLINIC 07/09/15   Tonny BollmanMichael Cooper, MD   There were no vitals taken for this visit. Physical Exam  Constitutional: She is oriented to person, place, and time. She appears well-developed and well-nourished.  HENT:  Head: Normocephalic and atraumatic.  Eyes: Conjunctivae are normal. Pupils are equal, round, and reactive to light.  Neck: Normal range of motion.  Cardiovascular: Normal rate.  An irregularly irregular rhythm present.  Pulmonary/Chest: Effort normal. No stridor. No respiratory distress. She has no wheezes. She has no rales.  Abdominal: She exhibits no distension.  Musculoskeletal: Normal range of motion. She exhibits no edema or tenderness.  Neurological: She is alert and oriented to person, place, and time.  Nursing note and vitals reviewed.   ED Course  Procedures (including critical care time) Labs Review Labs Reviewed  COMPREHENSIVE METABOLIC PANEL - Abnormal; Notable for the following:    Glucose, Bld 151 (*)    All other components within normal limits  PROTIME-INR - Abnormal; Notable for the following:    Prothrombin Time 18.5 (*)    INR 1.54 (*)    All other components within normal limits  CBC WITH DIFFERENTIAL/PLATELET  TROPONIN I  TSH  TROPONIN I    Imaging Review Dg Chest 2 View  09/12/2015  CLINICAL DATA:  Cardiac palpitations EXAM: CHEST  2 VIEW COMPARISON:  Chest radiograph Mar 19, 2011; chest CT December 22, 2011 FINDINGS: There is no edema or consolidation. Heart size and pulmonary vascularity are within normal limits. Pacemaker leads are attached to the right atrium and right ventricle.  Patient is status post coronary artery bypass grafting. There is atherosclerotic calcification in the aorta. There is no appreciable thoracic adenopathy. There is degenerative change in the thoracic spine. IMPRESSION: No edema or consolidation. Electronically Signed   By: Bretta BangWilliam  Woodruff III M.D.   On: 09/12/2015 08:52   I have personally reviewed and evaluated these images and lab results as part of my medical decision-making.   EKG Interpretation   Date/Time:  Thursday September 12 2015 07:54:26 EST Ventricular Rate:  78 PR Interval:  179 QRS Duration: 95 QT Interval:  438 QTC Calculation: 499 R Axis:   59 Text Interpretation:  Atrial-paced complexes Probable anterior infarct,  age indeterminate Confirmed by Liticia Gasior MD, Barbara CowerJASON [id number]) on 09/12/2015  8:04:25 AM      MDM   Final diagnoses:  Paroxysmal atrial fibrillation (HCC)   74 year old female here with likely paroxysmal atrial fibrillation. However with the chest tightness I  will do a delta troponins to ensure she has no evidence of ischemia. Also interrogate her pacemaker to make sure she had no episodes of ventricular tachycardia or ventricular fibrillation. If these are normal I will discuss the case with cardiology about disposition and management.Marland Kitchen  0930: still with palpitations, appears to be paced rhythm  0936: spoke with Dr. Gery Pray, on-call for Dr. Ladona Ridgel, who agrees with delta troponins and discharged. Also recommends taking 5 mg of Coumadin for the next 3 days and then taken 2.5 mg as directed and following up in a week with INR clinic. He will also put in for the patient to have a quick follow-up with Dr. Ladona Ridgel.  I have personally and contemperaneously reviewed labs and imaging and used in my decision making as above.   A medical screening exam was performed and I feel the patient has had an appropriate workup for their chief complaint at this time and likelihood of emergent condition existing is low. They have been  counseled on decision, discharge, follow up and which symptoms necessitate immediate return to the emergency department. They or their family verbally stated understanding and agreement with plan and discharged in stable condition.      Marily Memos, MD 09/12/15 1540

## 2015-09-18 ENCOUNTER — Ambulatory Visit (INDEPENDENT_AMBULATORY_CARE_PROVIDER_SITE_OTHER): Payer: Medicare Other | Admitting: Pharmacist

## 2015-09-18 DIAGNOSIS — I4891 Unspecified atrial fibrillation: Secondary | ICD-10-CM | POA: Diagnosis not present

## 2015-09-18 DIAGNOSIS — Z5181 Encounter for therapeutic drug level monitoring: Secondary | ICD-10-CM | POA: Diagnosis not present

## 2015-09-18 LAB — POCT INR: INR: 3

## 2015-09-25 ENCOUNTER — Encounter: Payer: Self-pay | Admitting: Internal Medicine

## 2015-09-25 ENCOUNTER — Ambulatory Visit (INDEPENDENT_AMBULATORY_CARE_PROVIDER_SITE_OTHER): Payer: Medicare Other | Admitting: Internal Medicine

## 2015-09-25 VITALS — BP 140/68 | HR 66 | Ht 64.0 in | Wt 236.6 lb

## 2015-09-25 DIAGNOSIS — I2510 Atherosclerotic heart disease of native coronary artery without angina pectoris: Secondary | ICD-10-CM | POA: Diagnosis not present

## 2015-09-25 DIAGNOSIS — I10 Essential (primary) hypertension: Secondary | ICD-10-CM

## 2015-09-25 DIAGNOSIS — Z950 Presence of cardiac pacemaker: Secondary | ICD-10-CM

## 2015-09-25 DIAGNOSIS — I4891 Unspecified atrial fibrillation: Secondary | ICD-10-CM

## 2015-09-25 NOTE — Assessment & Plan Note (Signed)
Her systolic blood pressure is a tad elevated. Will follow.

## 2015-09-25 NOTE — Progress Notes (Signed)
HPI [name] returns today after a long absence from our arrhythmia clinic. She is a pleasant 74 yo woman with PAF, HTN, symptomatic bradycardia, s/p PPM insertion. She has recently felt more palpitations. No syncope. No chest pain. Minimal sob with her sensation of heart racing. She has mild peripheral edema. The episodes may last upto 2 hours.  Allergies  Allergen Reactions  . Crestor [Rosuvastatin] Other (See Comments)    Leg aches on 5 mg daily  . Lipitor [Atorvastatin] Other (See Comments)    Leg aches  . Pravastatin Other (See Comments)    Muscle aches on 20 mg daily     Current Outpatient Prescriptions  Medication Sig Dispense Refill  . acetaminophen (TYLENOL) 500 MG tablet Take 500 mg by mouth every 6 (six) hours as needed for mild pain.     Marland Kitchen. amLODipine (NORVASC) 10 MG tablet TAKE 1 TABLET BY MOUTH EVERY DAY 90 tablet 3  . aspirin EC 81 MG tablet Take 81 mg by mouth daily.    Marland Kitchen. HYDROcodone-acetaminophen (NORCO) 5-325 MG per tablet Take 1 tablet by mouth every 8 (eight) hours as needed for moderate pain.     Marland Kitchen. losartan (COZAAR) 50 MG tablet TAKE 1 TABLET BY MOUTH EVERY DAY 90 tablet 3  . metoprolol succinate (TOPROL-XL) 100 MG 24 hr tablet TAKE 1 TABLET BY MOUTH EVERY DAY WITH OR IMMEDIATELY FOLLOWING A MEAL 90 tablet 1  . pantoprazole (PROTONIX) 40 MG tablet TAKE 1 TABLET BY MOUTH EVERY DAY 90 tablet 1  . warfarin (COUMADIN) 2.5 MG tablet TAKE AS DIRECTED BY ANTICOAGULATION CLINIC (Patient taking differently: Take 1 tablet (2.5mg ) every Sunday, monday, wednesday, thursday, and friday.  take 2 tablets (5mg  total) every tuesday and saturday) 135 tablet 1   No current facility-administered medications for this visit.     Past Medical History  Diagnosis Date  . OBESITY   . CAD, ARTERY BYPASS GRAFT 11/2009  . LUNG NODULE     stable RLL nodule CT 04/2010 and 10/2010 and 03/2011  . 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 PAF (paroxysmal atrial fibrillation)  (HCC)     chronic anticoag  . MYOCARDIAL INFARCTION, ACUTE, ANTERIOR WALL 11/2009  . Hypertension   . HYPERCHOLESTEROLEMIA  IIA   . Depression   . ARTHRITIS, CERVICAL SPINE   . Thyroid nodule 2011    neg bx 01/2010    ROS:   All systems reviewed and negative except as noted in the HPI.   Past Surgical History  Procedure Laterality Date  . Coronary artery bypass graft  11-2009  . Cholecystectomy    . Appendectomy    . Breast lumpectomy  1967    right breast (bening)  . Tonsillectomy    . Angioplasty      ans stenting of her LAD     Family History  Problem Relation Age of Onset  . Other      Significant for premature cardiovascular disease affecting her father in his 7250's  . Alcohol abuse      family hx addiction (parent)  . Arthritis      parent  . Hypertension      parent and other relative  . Cervical cancer Mother   . Hypothyroidism Daughter      Social History   Social History  . Marital Status: Married    Spouse Name: N/A  . Number of Children: N/A  . Years of Education: N/A  Occupational History  . retired     Archivist   Social History Main Topics  . Smoking status: Former Smoker    Quit date: 11/02/2009  . Smokeless tobacco: Not on file  . Alcohol Use: No  . Drug Use: No  . Sexual Activity: Not on file   Other Topics Concern  . Not on file   Social History Narrative     BP 140/68 mmHg  Pulse 66  Ht  (1.626 m)  Wt 236 lb 9.6 oz (107.321 kg)  BMI 40.59 kg/m2  Physical Exam:  Well appearing 74 yo woman, NAD HEENT: Unremarkable Neck:  6 cm JVD, no thyromegally Lymphatics:  No adenopathy Back:  No CVA tenderness Lungs:  Clear with no wheezes, rales, or rhonchi. HEART:  Regular rate rhythm, no murmurs, no rubs, no clicks Abd:  soft, positive bowel sounds, no organomegally, no rebound, no guarding Ext:  2 plus pulses, no edema, no cyanosis, no clubbing Skin:  No rashes no nodules Neuro:  CN II through XII intact,  motor grossly intact   DEVICE  Normal device function.  See PaceArt for details.   Assess/Plan:

## 2015-09-25 NOTE — Patient Instructions (Signed)
Medication Instructions:  Your physician recommends that you continue on your current medications as directed. Please refer to the Current Medication list given to you today.   Labwork: None ordered   Testing/Procedures: None ordered   Follow-Up: Your physician wants you to follow-up in: 12 months with Dr Taylor You will receive a reminder letter in the mail two months in advance. If you don't receive a letter, please call our office to schedule the follow-up appointment.  Remote monitoring is used to monitor your Pacemaker from home. This monitoring reduces the number of office visits required to check your device to one time per year. It allows us to keep an eye on the functioning of your device to ensure it is working properly. You are scheduled for a device check from home on 12/25/15. You may send your transmission at any time that day. If you have a wireless device, the transmission will be sent automatically. After your physician reviews your transmission, you will receive a postcard with your next transmission date.     Any Other Special Instructions Will Be Listed Below (If Applicable).     If you need a refill on your cardiac medications before your next appointment, please call your pharmacy.   

## 2015-09-25 NOTE — Assessment & Plan Note (Signed)
Her St. Jude DDD PM is working normally. Will recheck in several months.  

## 2015-09-25 NOTE — Assessment & Plan Note (Signed)
She is maintaining NSR 98% of the time. We discussed the possibility of using AA drugs. Her options are limited somewhat. She is instructed to take an additional metoprolol if she has breakthrough symptoms of heart racing or sustained palpitations.

## 2015-10-09 ENCOUNTER — Ambulatory Visit (INDEPENDENT_AMBULATORY_CARE_PROVIDER_SITE_OTHER): Payer: Medicare Other | Admitting: *Deleted

## 2015-10-09 DIAGNOSIS — Z5181 Encounter for therapeutic drug level monitoring: Secondary | ICD-10-CM

## 2015-10-09 DIAGNOSIS — I4891 Unspecified atrial fibrillation: Secondary | ICD-10-CM | POA: Diagnosis not present

## 2015-10-09 LAB — POCT INR: INR: 3

## 2015-10-18 ENCOUNTER — Encounter: Payer: Self-pay | Admitting: *Deleted

## 2015-10-18 ENCOUNTER — Encounter: Payer: Medicare Other | Admitting: Internal Medicine

## 2015-10-18 DIAGNOSIS — Z006 Encounter for examination for normal comparison and control in clinical research program: Secondary | ICD-10-CM

## 2015-10-18 NOTE — Progress Notes (Signed)
Called patient to assess for any reportable events related to the SPIRE II trial.  None to report. Subject participation has ended for this study. 

## 2015-11-06 ENCOUNTER — Ambulatory Visit (INDEPENDENT_AMBULATORY_CARE_PROVIDER_SITE_OTHER): Payer: Medicare Other | Admitting: Pharmacist

## 2015-11-06 DIAGNOSIS — Z5181 Encounter for therapeutic drug level monitoring: Secondary | ICD-10-CM | POA: Diagnosis not present

## 2015-11-06 DIAGNOSIS — I4891 Unspecified atrial fibrillation: Secondary | ICD-10-CM

## 2015-11-06 LAB — POCT INR: INR: 3.1

## 2015-11-27 ENCOUNTER — Other Ambulatory Visit: Payer: Self-pay | Admitting: Cardiovascular Disease

## 2015-12-04 ENCOUNTER — Ambulatory Visit (INDEPENDENT_AMBULATORY_CARE_PROVIDER_SITE_OTHER): Payer: Medicare Other | Admitting: *Deleted

## 2015-12-04 DIAGNOSIS — I4891 Unspecified atrial fibrillation: Secondary | ICD-10-CM

## 2015-12-04 DIAGNOSIS — Z5181 Encounter for therapeutic drug level monitoring: Secondary | ICD-10-CM

## 2015-12-04 LAB — POCT INR: INR: 2.4

## 2015-12-25 ENCOUNTER — Encounter: Payer: Medicare Other | Admitting: *Deleted

## 2015-12-25 ENCOUNTER — Encounter: Payer: Self-pay | Admitting: *Deleted

## 2016-01-01 ENCOUNTER — Ambulatory Visit (INDEPENDENT_AMBULATORY_CARE_PROVIDER_SITE_OTHER): Payer: Medicare Other | Admitting: *Deleted

## 2016-01-01 DIAGNOSIS — Z5181 Encounter for therapeutic drug level monitoring: Secondary | ICD-10-CM

## 2016-01-01 DIAGNOSIS — I4891 Unspecified atrial fibrillation: Secondary | ICD-10-CM

## 2016-01-01 LAB — POCT INR: INR: 1.9

## 2016-01-16 ENCOUNTER — Other Ambulatory Visit: Payer: Self-pay | Admitting: Cardiovascular Disease

## 2016-01-28 ENCOUNTER — Telehealth: Payer: Self-pay | Admitting: *Deleted

## 2016-01-28 NOTE — Telephone Encounter (Signed)
No longer see MD but had flu shot @ [facility].../lmb

## 2016-01-29 ENCOUNTER — Ambulatory Visit (INDEPENDENT_AMBULATORY_CARE_PROVIDER_SITE_OTHER): Payer: Medicare Other | Admitting: *Deleted

## 2016-01-29 DIAGNOSIS — I4891 Unspecified atrial fibrillation: Secondary | ICD-10-CM

## 2016-01-29 DIAGNOSIS — Z5181 Encounter for therapeutic drug level monitoring: Secondary | ICD-10-CM | POA: Diagnosis not present

## 2016-01-29 LAB — POCT INR: INR: 2.1

## 2016-02-17 ENCOUNTER — Other Ambulatory Visit: Payer: Self-pay | Admitting: Cardiovascular Disease

## 2016-02-22 ENCOUNTER — Other Ambulatory Visit: Payer: Self-pay | Admitting: Cardiovascular Disease

## 2016-02-26 ENCOUNTER — Ambulatory Visit (INDEPENDENT_AMBULATORY_CARE_PROVIDER_SITE_OTHER): Payer: Medicare Other | Admitting: Surgery

## 2016-02-26 DIAGNOSIS — I4891 Unspecified atrial fibrillation: Secondary | ICD-10-CM

## 2016-02-26 DIAGNOSIS — Z5181 Encounter for therapeutic drug level monitoring: Secondary | ICD-10-CM | POA: Diagnosis not present

## 2016-02-26 LAB — POCT INR: INR: 2.1

## 2016-03-06 ENCOUNTER — Other Ambulatory Visit: Payer: Self-pay | Admitting: Cardiovascular Disease

## 2016-03-22 NOTE — Progress Notes (Signed)
Cardiology Office Note Date:  03/23/2016   ID:  Erica, Hood 02-Jan-1941, MRN [id number]  PCP:  No PCP Per Patient  Cardiologist:  Tonny Bollman, MD    Chief Complaint  Patient presents with  . Coronary Artery Disease    native vessel. no cp,sob,or claudication. has some ankle swelling    History of Present Illness: Erica Hood is a 75 y.o. female who presents for follow up evaluation.  She has a history of CAD s/p CABG and paroxysmal atrial fibrillation on warfarin. She initially presented in 2011 with an acute anterior wall MI and was treated with DES to the LAD, but had severe residual multivessel disease and thus underwent CABG. She required permanent pacemaker insertion.   The patient is doing fairly well. She reports no change in symptoms since I saw her last year. She has mild shortness of breath with climbing stairs. Otherwise her breathing is okay. She does water exercises 6 days per week without symptoms. She denies chest pain or pressure, orthopnea, or PND. She complains of mild left leg swelling at the end of the day.   Past Medical History  Diagnosis Date  . OBESITY   . CAD, ARTERY BYPASS GRAFT 11/2009  . LUNG NODULE     stable RLL nodule CT 04/2010 and 10/2010 and 03/2011  . 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 PAF (paroxysmal atrial fibrillation) (HCC)     chronic anticoag  . MYOCARDIAL INFARCTION, ACUTE, ANTERIOR WALL 11/2009  . Hypertension   . HYPERCHOLESTEROLEMIA  IIA   . Depression   . ARTHRITIS, CERVICAL SPINE   . Thyroid nodule 2011    neg bx 01/2010    Past Surgical History  Procedure Laterality Date  . Coronary artery bypass graft  11-2009  . Cholecystectomy    . Appendectomy    . Breast lumpectomy  1967    right breast (bening)  . Tonsillectomy    . Angioplasty      ans stenting of her LAD    Current Outpatient Prescriptions  Medication Sig Dispense Refill  . acetaminophen (TYLENOL) 500 MG tablet Take 500 mg by mouth  every 6 (six) hours as needed for mild pain.     Marland Kitchen amLODipine (NORVASC) 10 MG tablet Take 1 tablet (10 mg total) by mouth daily. 90 tablet 3  . aspirin EC 81 MG tablet Take 81 mg by mouth daily.    Marland Kitchen HYDROcodone-acetaminophen (NORCO) 5-325 MG per tablet Take 1 tablet by mouth every 8 (eight) hours as needed for moderate pain.     Marland Kitchen losartan (COZAAR) 50 MG tablet Take 1 tablet (50 mg total) by mouth daily. 90 tablet 3  . metoprolol succinate (TOPROL-XL) 100 MG 24 hr tablet TAKE 1 TABLET BY MOUTH EVERY DAY WITH OR IMMEDIATELY FOLLOWING A MEAL 90 tablet 3  . pantoprazole (PROTONIX) 40 MG tablet Take 1 tablet (40 mg total) by mouth daily. 90 tablet 3  . warfarin (COUMADIN) 2.5 MG tablet TAKE AS DIRECTED BY ANTICOAGULATION CLINIC 120 tablet 1   No current facility-administered medications for this visit.   Allergies:   Crestor; Lipitor; and Pravastatin   Social History:  The patient  reports that she quit smoking about 6 years ago. She does not have any smokeless tobacco history on file. She reports that she does not drink alcohol or use illicit drugs.   Family History:  The patient's  family history includes Cervical cancer in her mother;  Hypothyroidism in her daughter.   ROS:  Please see the history of present illness.  Otherwise, review of systems is positive for back pain, ankle swelling.  All other systems are reviewed and negative.   PHYSICAL EXAM: VS:  BP 132/80 mmHg  Pulse 68  Ht 5\' 4"  (1.626 m)  Wt 236 lb (107.049 kg)  BMI 40.49 kg/m2 , BMI Body mass index is 40.49 kg/(m^2). GEN: Well nourished, well developed, pleasant obese woman in no acute distress HEENT: normal Neck: no JVD, no masses. No carotid bruits Cardiac: RRR without murmur or gallop                Respiratory:  clear to auscultation bilaterally, normal work of breathing GI: soft, nontender, nondistended, + BS MS: no deformity or atrophy Ext: trace pretibial edema on the left, pedal pulses 2+= bilaterally Skin: warm  and dry, no rash Neuro:  Strength and sensation are intact Psych: euthymic mood, full affect  EKG:  EKG is not ordered today.  Recent Labs: 09/12/2015: ALT 22; BUN 18; Creatinine, Ser 0.86; Hemoglobin 14.1; Platelets 287; Potassium 3.8; Sodium 137; TSH 2.346   Lipid Panel     Component Value Date/Time   CHOL 194 01/03/2014 0743   TRIG 117.0 01/03/2014 0743   HDL 45.80 01/03/2014 0743   CHOLHDL 4 01/03/2014 0743   VLDL 23.4 01/03/2014 0743   LDLCALC 125* 01/03/2014 0743   LDLDIRECT 167.7 09/06/2012 1118      Wt Readings from Last 3 Encounters:  03/23/16 236 lb (107.049 kg)  09/25/15 236 lb 9.6 oz (107.321 kg)  09/12/15 235 lb (106.595 kg)    ASSESSMENT AND PLAN: 1. CAD, native vessel: Patient is status post CABG without symptoms of angina. Will continue her current medical program which was reviewed today.   2. Paroxysmal atrial fibrillation: The patient is tolerating chronic anticoagulation with warfarin. She reports no bleeding problems. She does complain of difficulty maintaining a therapeutic INR. She has become frustrated with some of the dietary restrictions. We discussed consideration of a direct oral anticoagulant drugs such as Xarelto or Eliquis. She would like to check on pricing with her pharmacy. We reviewed pros and cons of these medicines compared with warfarin. She will let us know if she would like to change.   3. Essential hypertension: Blood pressure well controlled on multidrug therapy.  4. Hyperlipidemia: has completed with Spire II trial. Would like to know if she received treatment drug or placebo - I will check with the research team. Will update lipids and LFT's. She is statin-intolerant.   5. Edema: mild, asymmetric. Will update echo. Discussed lifestyle modification.   Current medicines are reviewed with the patient today.  The patient does not have concerns regarding medicines.  Labs/ tests ordered today include:   Orders Placed This Encounter    Procedures  . Lipid panel  . Hepatic function panel  . ECHOCARDIOGRAM COMPLETE    Disposition:   FU one year. She may move to [place] to be closer to her children in the next, but will put in recall for one year follow-up just in case she is still here.   Enzo BiSigned, Vola Beneke, MD  03/23/2016 9:45 AM    [facility] [address], [place], [place]  [address] Phone: [phone]; Fax: [phone]

## 2016-03-23 ENCOUNTER — Encounter: Payer: Self-pay | Admitting: Cardiovascular Disease

## 2016-03-23 ENCOUNTER — Ambulatory Visit (INDEPENDENT_AMBULATORY_CARE_PROVIDER_SITE_OTHER): Payer: Medicare Other | Admitting: *Deleted

## 2016-03-23 ENCOUNTER — Ambulatory Visit (INDEPENDENT_AMBULATORY_CARE_PROVIDER_SITE_OTHER): Payer: Medicare Other | Admitting: Cardiovascular Disease

## 2016-03-23 VITALS — BP 132/80 | HR 68 | Ht 64.0 in | Wt 236.0 lb

## 2016-03-23 DIAGNOSIS — R0602 Shortness of breath: Secondary | ICD-10-CM

## 2016-03-23 DIAGNOSIS — I4891 Unspecified atrial fibrillation: Secondary | ICD-10-CM | POA: Diagnosis not present

## 2016-03-23 DIAGNOSIS — I480 Paroxysmal atrial fibrillation: Secondary | ICD-10-CM

## 2016-03-23 DIAGNOSIS — Z5181 Encounter for therapeutic drug level monitoring: Secondary | ICD-10-CM | POA: Diagnosis not present

## 2016-03-23 DIAGNOSIS — E785 Hyperlipidemia, unspecified: Secondary | ICD-10-CM

## 2016-03-23 LAB — POCT INR: INR: 1.7

## 2016-03-23 MED ORDER — AMLODIPINE BESYLATE 10 MG PO TABS: 10.0000 mg | ORAL_TABLET | Freq: Every day | ORAL | Status: DC

## 2016-03-23 MED ORDER — PANTOPRAZOLE SODIUM 40 MG PO TBEC: 40.0000 mg | DELAYED_RELEASE_TABLET | Freq: Every day | ORAL | Status: DC

## 2016-03-23 MED ORDER — METOPROLOL SUCCINATE ER 100 MG PO TB24: ORAL_TABLET | ORAL | Status: DC

## 2016-03-23 MED ORDER — LOSARTAN POTASSIUM 50 MG PO TABS: 50.0000 mg | ORAL_TABLET | Freq: Every day | ORAL | Status: DC

## 2016-03-23 NOTE — Patient Instructions (Signed)
Medication Instructions:  Your physician recommends that you continue on your current medications as directed. Please refer to the Current Medication list given to you today.  Please check into the cost of: Xarelto 20mg  daily Eliquis 5mg  twice a day  Labwork: Your physician recommends that you return for a FASTING LIPID and LIVER the same day as Echo--nothing to eat or drink after midnight, lab opens at 7:30 AM  Testing/Procedures: Your physician has requested that you have an echocardiogram. Echocardiography is a painless test that uses sound waves to create images of your heart. It provides your doctor with information about the size and shape of your heart and how well your heart's chambers and valves are working. This procedure takes approximately one hour. There are no restrictions for this procedure.  Follow-Up: Your physician wants you to follow-up in: 1 YEAR with Dr Excell Seltzerooper.  You will receive a reminder letter in the mail two months in advance. If you don't receive a letter, please call our office to schedule the follow-up appointment.   Any Other Special Instructions Will Be Listed Below (If Applicable).     If you need a refill on your cardiac medications before your next appointment, please call your pharmacy.

## 2016-03-25 ENCOUNTER — Ambulatory Visit (HOSPITAL_COMMUNITY): Payer: Medicare Other | Attending: Cardiovascular Disease

## 2016-03-25 ENCOUNTER — Other Ambulatory Visit: Payer: Self-pay

## 2016-03-25 ENCOUNTER — Other Ambulatory Visit (INDEPENDENT_AMBULATORY_CARE_PROVIDER_SITE_OTHER): Payer: Medicare Other | Admitting: *Deleted

## 2016-03-25 DIAGNOSIS — Z950 Presence of cardiac pacemaker: Secondary | ICD-10-CM | POA: Insufficient documentation

## 2016-03-25 DIAGNOSIS — Z6841 Body Mass Index (BMI) 40.0 and over, adult: Secondary | ICD-10-CM | POA: Diagnosis not present

## 2016-03-25 DIAGNOSIS — I4891 Unspecified atrial fibrillation: Secondary | ICD-10-CM | POA: Diagnosis present

## 2016-03-25 DIAGNOSIS — I071 Rheumatic tricuspid insufficiency: Secondary | ICD-10-CM | POA: Insufficient documentation

## 2016-03-25 DIAGNOSIS — I2510 Atherosclerotic heart disease of native coronary artery without angina pectoris: Secondary | ICD-10-CM | POA: Diagnosis not present

## 2016-03-25 DIAGNOSIS — E785 Hyperlipidemia, unspecified: Secondary | ICD-10-CM | POA: Diagnosis not present

## 2016-03-25 DIAGNOSIS — Z951 Presence of aortocoronary bypass graft: Secondary | ICD-10-CM | POA: Insufficient documentation

## 2016-03-25 DIAGNOSIS — E669 Obesity, unspecified: Secondary | ICD-10-CM | POA: Diagnosis not present

## 2016-03-25 DIAGNOSIS — I480 Paroxysmal atrial fibrillation: Secondary | ICD-10-CM

## 2016-03-25 DIAGNOSIS — R0602 Shortness of breath: Secondary | ICD-10-CM

## 2016-03-25 DIAGNOSIS — I10 Essential (primary) hypertension: Secondary | ICD-10-CM | POA: Diagnosis not present

## 2016-03-25 LAB — LIPID PANEL
CHOLESTEROL: 220 mg/dL — AB (ref 125–200)
HDL: 55 mg/dL (ref >=46)
LDL Cholesterol: 135 mg/dL — ABNORMAL HIGH (ref <130)
TRIGLYCERIDES: 151 mg/dL — AB (ref <150)
Total CHOL/HDL Ratio: 4 Ratio (ref <=5.0)
VLDL: 30 mg/dL (ref <30)

## 2016-03-25 LAB — HEPATIC FUNCTION PANEL
ALK PHOS: 80 U/L (ref 33–130)
ALT: 18 U/L (ref 6–29)
AST: 19 U/L (ref 10–35)
Albumin: 4.2 g/dL (ref 3.6–5.1)
BILIRUBIN DIRECT: 0.1 mg/dL (ref <=0.2)
BILIRUBIN INDIRECT: 0.4 mg/dL (ref 0.2–1.2)
TOTAL PROTEIN: 7 g/dL (ref 6.1–8.1)
Total Bilirubin: 0.5 mg/dL (ref 0.2–1.2)

## 2016-04-10 ENCOUNTER — Ambulatory Visit (INDEPENDENT_AMBULATORY_CARE_PROVIDER_SITE_OTHER): Payer: Medicare Other | Admitting: *Deleted

## 2016-04-10 DIAGNOSIS — I4891 Unspecified atrial fibrillation: Secondary | ICD-10-CM | POA: Diagnosis not present

## 2016-04-10 DIAGNOSIS — Z5181 Encounter for therapeutic drug level monitoring: Secondary | ICD-10-CM | POA: Diagnosis not present

## 2016-04-10 LAB — POCT INR: INR: 2.4

## 2016-05-01 ENCOUNTER — Ambulatory Visit (INDEPENDENT_AMBULATORY_CARE_PROVIDER_SITE_OTHER): Payer: Medicare Other | Admitting: *Deleted

## 2016-05-01 ENCOUNTER — Encounter: Payer: Self-pay | Admitting: Internal Medicine

## 2016-05-01 DIAGNOSIS — I4891 Unspecified atrial fibrillation: Secondary | ICD-10-CM

## 2016-05-01 DIAGNOSIS — Z950 Presence of cardiac pacemaker: Secondary | ICD-10-CM

## 2016-05-01 DIAGNOSIS — Z5181 Encounter for therapeutic drug level monitoring: Secondary | ICD-10-CM

## 2016-05-01 LAB — CUP PACEART INCLINIC DEVICE CHECK
Battery Remaining Longevity: 111.6
Brady Statistic RA Percent Paced: 21 %
Brady Statistic RV Percent Paced: 0.33 %
Date Time Interrogation Session: 20170630092707
Implantable Lead Location: 753860
Lead Channel Impedance Value: 387.5 Ohm
Lead Channel Pacing Threshold Amplitude: 0.5 V
Lead Channel Pacing Threshold Pulse Width: 0.4 ms
Lead Channel Setting Sensing Sensitivity: 2 mV
MDC IDC LEAD IMPLANT DT: 20110602
MDC IDC LEAD IMPLANT DT: 20110602
MDC IDC LEAD LOCATION: 753859
MDC IDC MSMT BATTERY VOLTAGE: 2.92 V
MDC IDC MSMT LEADCHNL RA IMPEDANCE VALUE: 387.5 Ohm
MDC IDC MSMT LEADCHNL RA PACING THRESHOLD AMPLITUDE: 0.5 V
MDC IDC MSMT LEADCHNL RA PACING THRESHOLD PULSEWIDTH: 0.4 ms
MDC IDC MSMT LEADCHNL RA SENSING INTR AMPL: 3.5 mV
MDC IDC MSMT LEADCHNL RV PACING THRESHOLD AMPLITUDE: 1 V
MDC IDC MSMT LEADCHNL RV PACING THRESHOLD AMPLITUDE: 1 V
MDC IDC MSMT LEADCHNL RV PACING THRESHOLD PULSEWIDTH: 0.4 ms
MDC IDC MSMT LEADCHNL RV PACING THRESHOLD PULSEWIDTH: 0.4 ms
MDC IDC MSMT LEADCHNL RV SENSING INTR AMPL: 12 mV
MDC IDC PG SERIAL: 7107049
MDC IDC SET LEADCHNL RA PACING AMPLITUDE: 1.625
MDC IDC SET LEADCHNL RV PACING AMPLITUDE: 1.25 V
MDC IDC SET LEADCHNL RV PACING PULSEWIDTH: 0.4 ms
Pulse Gen Model: 2110

## 2016-05-01 LAB — POCT INR: INR: 2.7

## 2016-05-01 NOTE — Progress Notes (Signed)
Pacemaker check in clinic. Normal device function. Thresholds, sensing, impedances consistent with previous measurements. Device programmed to maximize longevity. 1598 AMS episodes, EGMs indicate atrial noise that is not reproducible in office. AT/AF burden <1% + Coumadin. No high ventricular rates noted. Device programmed at appropriate safety margins. Histogram distribution appropriate for patient activity level. Device programmed to optimize intrinsic conduction. Estimated longevity 8-2630yrs. ROV with GT 09/2016. Patient education completed.

## 2016-05-06 ENCOUNTER — Encounter: Payer: Self-pay | Admitting: *Deleted

## 2016-05-06 ENCOUNTER — Encounter: Payer: Self-pay | Admitting: Cardiovascular Disease

## 2016-05-06 DIAGNOSIS — Z006 Encounter for examination for normal comparison and control in clinical research program: Secondary | ICD-10-CM

## 2016-05-06 NOTE — Progress Notes (Signed)
Subject met inclusion and exclusion criteria.  The informed consent form, study requirements and expectations were reviewed with the subject and questions and concerns were addressed prior to the signing of the consent form.  The subject verbalized understanding of the trail requirements.  The subject agreed to participate in the CLEAR trial and signed the informed consent.  The informed consent was obtained prior to performance of any protocol-specific procedures for the subject.  A copy of the signed informed consent was given to the subject and a copy was placed in the subject's medical record.    Jake Bathe, RN [id number]

## 2016-05-18 ENCOUNTER — Other Ambulatory Visit: Payer: Self-pay | Admitting: *Deleted

## 2016-05-18 MED ORDER — AMBULATORY NON FORMULARY MEDICATION: 180.0000 mg | Freq: Every day | Status: DC

## 2016-05-29 ENCOUNTER — Ambulatory Visit (INDEPENDENT_AMBULATORY_CARE_PROVIDER_SITE_OTHER): Payer: Medicare Other | Admitting: *Deleted

## 2016-05-29 DIAGNOSIS — I4891 Unspecified atrial fibrillation: Secondary | ICD-10-CM

## 2016-05-29 DIAGNOSIS — Z5181 Encounter for therapeutic drug level monitoring: Secondary | ICD-10-CM | POA: Diagnosis not present

## 2016-05-29 LAB — POCT INR: INR: 3

## 2016-06-29 ENCOUNTER — Ambulatory Visit (INDEPENDENT_AMBULATORY_CARE_PROVIDER_SITE_OTHER): Payer: Medicare Other | Admitting: *Deleted

## 2016-06-29 DIAGNOSIS — I4891 Unspecified atrial fibrillation: Secondary | ICD-10-CM

## 2016-06-29 DIAGNOSIS — Z5181 Encounter for therapeutic drug level monitoring: Secondary | ICD-10-CM | POA: Diagnosis not present

## 2016-06-29 LAB — POCT INR: INR: 2.2

## 2016-07-11 ENCOUNTER — Other Ambulatory Visit: Payer: Self-pay | Admitting: Cardiovascular Disease

## 2016-07-14 ENCOUNTER — Encounter: Payer: Self-pay | Admitting: Cardiovascular Disease

## 2016-08-10 ENCOUNTER — Ambulatory Visit (INDEPENDENT_AMBULATORY_CARE_PROVIDER_SITE_OTHER): Payer: Medicare Other | Admitting: *Deleted

## 2016-08-10 DIAGNOSIS — Z5181 Encounter for therapeutic drug level monitoring: Secondary | ICD-10-CM

## 2016-08-10 DIAGNOSIS — I4891 Unspecified atrial fibrillation: Secondary | ICD-10-CM

## 2016-08-10 LAB — POCT INR: INR: 2.1

## 2016-09-14 ENCOUNTER — Encounter: Payer: Self-pay | Admitting: *Deleted

## 2016-09-14 DIAGNOSIS — Z006 Encounter for examination for normal comparison and control in clinical research program: Secondary | ICD-10-CM

## 2016-09-14 NOTE — Progress Notes (Signed)
[facility] for CLEAR research study visit.  Only complaint was regarding right knee and lower leg bothering her.  No complaints in regards to study medication and she was administered another bottle of pills.  Will return to clinic in 3 months and voices understanding to call if she has any questions or concern.

## 2016-09-21 ENCOUNTER — Ambulatory Visit (INDEPENDENT_AMBULATORY_CARE_PROVIDER_SITE_OTHER): Payer: Medicare Other | Admitting: *Deleted

## 2016-09-21 DIAGNOSIS — I4891 Unspecified atrial fibrillation: Secondary | ICD-10-CM

## 2016-09-21 DIAGNOSIS — Z5181 Encounter for therapeutic drug level monitoring: Secondary | ICD-10-CM | POA: Diagnosis not present

## 2016-09-21 LAB — POCT INR: INR: 1.6

## 2016-10-05 ENCOUNTER — Ambulatory Visit (INDEPENDENT_AMBULATORY_CARE_PROVIDER_SITE_OTHER): Payer: Medicare Other | Admitting: *Deleted

## 2016-10-05 DIAGNOSIS — I4891 Unspecified atrial fibrillation: Secondary | ICD-10-CM | POA: Diagnosis not present

## 2016-10-05 DIAGNOSIS — Z5181 Encounter for therapeutic drug level monitoring: Secondary | ICD-10-CM | POA: Diagnosis not present

## 2016-10-05 LAB — POCT INR: INR: 1.9

## 2016-10-08 ENCOUNTER — Other Ambulatory Visit: Payer: Self-pay | Admitting: Cardiovascular Disease

## 2016-10-20 ENCOUNTER — Ambulatory Visit (INDEPENDENT_AMBULATORY_CARE_PROVIDER_SITE_OTHER): Payer: Medicare Other

## 2016-10-20 DIAGNOSIS — Z5181 Encounter for therapeutic drug level monitoring: Secondary | ICD-10-CM | POA: Diagnosis not present

## 2016-10-20 DIAGNOSIS — I4891 Unspecified atrial fibrillation: Secondary | ICD-10-CM

## 2016-10-20 LAB — POCT INR: INR: 2.3

## 2016-11-10 ENCOUNTER — Ambulatory Visit (INDEPENDENT_AMBULATORY_CARE_PROVIDER_SITE_OTHER): Payer: Medicare Other

## 2016-11-10 DIAGNOSIS — I4891 Unspecified atrial fibrillation: Secondary | ICD-10-CM

## 2016-11-10 DIAGNOSIS — Z5181 Encounter for therapeutic drug level monitoring: Secondary | ICD-10-CM

## 2016-11-10 LAB — POCT INR: INR: 2.2

## 2016-12-09 ENCOUNTER — Encounter: Payer: Self-pay | Admitting: Internal Medicine

## 2016-12-09 ENCOUNTER — Ambulatory Visit (INDEPENDENT_AMBULATORY_CARE_PROVIDER_SITE_OTHER): Payer: Medicare Other | Admitting: *Deleted

## 2016-12-09 ENCOUNTER — Ambulatory Visit (INDEPENDENT_AMBULATORY_CARE_PROVIDER_SITE_OTHER): Payer: Medicare Other | Admitting: Internal Medicine

## 2016-12-09 VITALS — BP 164/80 | HR 65 | Ht 64.0 in | Wt 238.8 lb

## 2016-12-09 DIAGNOSIS — I4891 Unspecified atrial fibrillation: Secondary | ICD-10-CM

## 2016-12-09 DIAGNOSIS — Z5181 Encounter for therapeutic drug level monitoring: Secondary | ICD-10-CM

## 2016-12-09 DIAGNOSIS — Z950 Presence of cardiac pacemaker: Secondary | ICD-10-CM | POA: Diagnosis not present

## 2016-12-09 LAB — POCT INR: INR: 2.2

## 2016-12-09 NOTE — Patient Instructions (Signed)
Medication Instructions:  Your physician recommends that you continue on your current medications as directed. Please refer to the Current Medication list given to you today.   Labwork: None Ordered   Testing/Procedures: None Ordered   Follow-Up: Follow-up with Dr. Taylor as needed    Any Other Special Instructions Will Be Listed Below (If Applicable).     If you need a refill on your cardiac medications before your next appointment, please call your pharmacy.   

## 2016-12-09 NOTE — Progress Notes (Signed)
238.8LB

## 2016-12-09 NOTE — Progress Notes (Signed)
HPI [name] returns today after a long absence from our arrhythmia clinic. She is a pleasant 76 yo woman with PAF, HTN, symptomatic bradycardia, s/p PPM insertion. She has recently felt more palpitations. No syncope. No chest pain. Minimal sob with her sensation of heart racing. She has mild peripheral edema. The episodes may last upto 2 hours. She is pending a move down to [place]. No dizziness. Allergies  Allergen Reactions  . Crestor [Rosuvastatin] Other (See Comments)    Leg aches on 5 mg daily  . Lipitor [Atorvastatin] Other (See Comments)    Leg aches  . Pravastatin Other (See Comments)    Muscle aches on 20 mg daily     Current Outpatient Prescriptions  Medication Sig Dispense Refill  . acetaminophen (TYLENOL) 500 MG tablet Take 500 mg by mouth every 6 (six) hours as needed for mild pain.     Marland Kitchen. AMBULATORY NON FORMULARY MEDICATION Take 180 mg by mouth daily. Medication Name: CLEAR Research Study , bempedoic acid 180 mg vs placebo, study drug provided    . amLODipine (NORVASC) 10 MG tablet Take 1 tablet (10 mg total) by mouth daily. 90 tablet 3  . aspirin EC 81 MG tablet Take 81 mg by mouth daily.    Marland Kitchen. HYDROcodone-acetaminophen (NORCO) 5-325 MG per tablet Take 1 tablet by mouth every 8 (eight) hours as needed for moderate pain.     Marland Kitchen. losartan (COZAAR) 50 MG tablet Take 1 tablet (50 mg total) by mouth daily. 90 tablet 3  . metoprolol succinate (TOPROL-XL) 100 MG 24 hr tablet TAKE 1 TABLET BY MOUTH EVERY DAY WITH OR IMMEDIATELY FOLLOWING A MEAL 90 tablet 3  . pantoprazole (PROTONIX) 40 MG tablet Take 1 tablet (40 mg total) by mouth daily. 90 tablet 3  . warfarin (COUMADIN) 2.5 MG tablet TAKE AS DIRECTED BY ANTICOAGULATION CLINIC 130 tablet 1   No current facility-administered medications for this visit.      Past Medical History:  Diagnosis Date  . ARTHRITIS, CERVICAL SPINE   . CAD, ARTERY BYPASS GRAFT 11/2009  . Cardiac pacemaker in situ 04/2010   due to SSS  .  Depression   . HYPERCHOLESTEROLEMIA  IIA   . Hypertension   . LUNG NODULE    stable RLL nodule CT 04/2010 and 10/2010 and 03/2011  . MYOCARDIAL INFARCTION, ACUTE, ANTERIOR WALL 11/2009  . OBESITY   . PAF (paroxysmal atrial fibrillation) (HCC)    chronic anticoag  . Thyroid nodule 2011   neg bx 01/2010    ROS:   All systems reviewed and negative except as noted in the HPI.   Past Surgical History:  Procedure Laterality Date  . ANGIOPLASTY     ans stenting of her LAD  . APPENDECTOMY    . BREAST LUMPECTOMY  1967   right breast (bening)  . CHOLECYSTECTOMY    . CORONARY ARTERY BYPASS GRAFT  11-2009  . TONSILLECTOMY       Family History  Problem Relation Age of Onset  . Other      Significant for premature cardiovascular disease affecting her father in his 3850's  . Alcohol abuse      family hx addiction (parent)  . Arthritis      parent  . Hypertension      parent and other relative  . Cervical cancer Mother   . Hypothyroidism Daughter      Social History   Social History  . Marital status: Married  Spouse name: N/A  . Number of children: N/A  . Years of education: N/A   Occupational History  . retired Retired    Archivist   Social History Main Topics  . Smoking status: Former Smoker    Quit date: 11/02/2009  . Smokeless tobacco: Never Used  . Alcohol use No  . Drug use: No  . Sexual activity: Not on file   Other Topics Concern  . Not on file   Social History Narrative  . No narrative on file     BP (!) 164/80 (BP Location: Right Arm, Patient Position: Sitting, Cuff Size: Normal)   Pulse 65   Ht 5\' 4"  (1.626 m)   Wt 238 lb 12.8 oz (108.3 kg)   SpO2 96%   BMI 40.99 kg/m   Physical Exam:  Well appearing 76 yo woman, NAD HEENT: Unremarkable Neck:  6 cm JVD, no thyromegally Lymphatics:  No adenopathy Back:  No CVA tenderness Lungs:  Clear with no wheezes, rales, or rhonchi. HEART:  Regular rate rhythm, no murmurs, no rubs, no  clicks Abd:  soft, positive bowel sounds, no organomegally, no rebound, no guarding Ext:  2 plus pulses, no edema, no cyanosis, no clubbing Skin:  No rashes no nodules Neuro:  CN II through XII intact, motor grossly intact  ECG - NSR with possible septal MI  DEVICE  Normal device function.  See PaceArt for details.   Assess/Plan: 1. Sinus node dysfunction - she is stable, s/p MI. Know additional treatment. 2. PPM - her St. Jude has some atrial lead noise for which she appears to be asymptomatic. 3. Obesity - she is encouraged to lose weight. 4. CAD - she denies anginal symptoms. Will follow. 5. HTN heart disease - she is encouraged to lose weight. As she is about to move to [facility], I will not adjust her meds but would consider doing so if she returns.  Leonia Reeves.D.

## 2016-12-10 LAB — CUP PACEART INCLINIC DEVICE CHECK
Battery Remaining Longevity: 87 mo
Implantable Lead Implant Date: 20110602
Implantable Lead Location: 753860
Implantable Pulse Generator Implant Date: 20110602
Lead Channel Impedance Value: 410 Ohm
Lead Channel Pacing Threshold Amplitude: 1 V
Lead Channel Pacing Threshold Pulse Width: 0.4 ms
Lead Channel Sensing Intrinsic Amplitude: 12 mV
Lead Channel Setting Pacing Amplitude: 1.25 V
Lead Channel Setting Pacing Amplitude: 1.625
Lead Channel Setting Pacing Pulse Width: 0.4 ms
Lead Channel Setting Sensing Sensitivity: 2 mV
MDC IDC LEAD IMPLANT DT: 20110602
MDC IDC LEAD LOCATION: 753859
MDC IDC MSMT LEADCHNL RA IMPEDANCE VALUE: 400 Ohm
MDC IDC MSMT LEADCHNL RA PACING THRESHOLD AMPLITUDE: 0.75 V
MDC IDC MSMT LEADCHNL RA PACING THRESHOLD PULSEWIDTH: 0.4 ms
MDC IDC MSMT LEADCHNL RA SENSING INTR AMPL: 2.3 mV
MDC IDC PG SERIAL: 7107049
MDC IDC SESS DTM: 20180208075242
MDC IDC STAT BRADY RA PERCENT PACED: 21 %

## 2017-01-13 ENCOUNTER — Ambulatory Visit (INDEPENDENT_AMBULATORY_CARE_PROVIDER_SITE_OTHER): Payer: Medicare Other | Admitting: *Deleted

## 2017-01-13 DIAGNOSIS — Z5181 Encounter for therapeutic drug level monitoring: Secondary | ICD-10-CM

## 2017-01-13 DIAGNOSIS — I4891 Unspecified atrial fibrillation: Secondary | ICD-10-CM

## 2017-01-13 LAB — POCT INR: INR: 2.6

## 2017-02-16 ENCOUNTER — Other Ambulatory Visit: Payer: Self-pay

## 2017-02-16 ENCOUNTER — Encounter: Payer: Self-pay | Admitting: Cardiovascular Disease

## 2017-02-16 MED ORDER — METOPROLOL SUCCINATE ER 100 MG PO TB24: ORAL_TABLET | ORAL | 1 refills | Status: AC

## 2017-02-16 MED ORDER — LOSARTAN POTASSIUM 50 MG PO TABS: 50.0000 mg | ORAL_TABLET | Freq: Every day | ORAL | 1 refills | Status: AC

## 2017-02-16 MED ORDER — PANTOPRAZOLE SODIUM 40 MG PO TBEC: 40.0000 mg | DELAYED_RELEASE_TABLET | Freq: Every day | ORAL | 1 refills | Status: AC

## 2017-02-16 MED ORDER — AMLODIPINE BESYLATE 10 MG PO TABS: 10.0000 mg | ORAL_TABLET | Freq: Every day | ORAL | 1 refills | Status: AC

## 2017-02-24 ENCOUNTER — Ambulatory Visit (INDEPENDENT_AMBULATORY_CARE_PROVIDER_SITE_OTHER): Payer: Medicare Other | Admitting: *Deleted

## 2017-02-24 DIAGNOSIS — Z5181 Encounter for therapeutic drug level monitoring: Secondary | ICD-10-CM | POA: Diagnosis not present

## 2017-02-24 DIAGNOSIS — I4891 Unspecified atrial fibrillation: Secondary | ICD-10-CM | POA: Diagnosis not present

## 2017-02-24 LAB — POCT INR: INR: 2.9

## 2017-02-24 MED ORDER — WARFARIN SODIUM 2.5 MG PO TABS: ORAL_TABLET | ORAL | 0 refills | Status: AC

## 2017-03-15 ENCOUNTER — Encounter: Payer: Self-pay | Admitting: *Deleted

## 2017-03-15 DIAGNOSIS — Z006 Encounter for examination for normal comparison and control in clinical research program: Secondary | ICD-10-CM

## 2017-03-15 NOTE — Progress Notes (Signed)
Spoke with patient for T5-M9 phone visit for the [employer] Research study.  No complaints, new meds or AEs to report. [place] and will need site in the area for next visit.  Next scheduled visit due near June 12, 2017.  [facility] was notified of new address and request to move center.

## 2019-05-01 ENCOUNTER — Telehealth: Payer: Self-pay

## 2019-05-01 ENCOUNTER — Telehealth: Payer: Self-pay | Admitting: *Deleted

## 2019-05-01 NOTE — Telephone Encounter (Signed)
LMOVM requesting call back to [place]. Gave direct number.  Per notes, pt was planning to move to [facility] in 2018. Remote monitoring transfer for PPM has not been requested and no transmissions have been received (manual monitor). Will ask if pt established with EP in [place].

## 2019-05-01 NOTE — Telephone Encounter (Signed)
Noted. PaceArt updated, unrolled from [employer].

## 2019-05-01 NOTE — Telephone Encounter (Signed)
Pt returned Emily's phone call. I told her per Emily's note we seen that she moved in 2018 to [place] and we needed to know if she established with an EP doctor. Pt states she now lives in [place] and have no plans on moving back to [place]. Pt states she do have an EP doctor in [facility] and had all her records transferred there. She states she no longer uses an home monitor for her ppm. She only gets it checked in the office. I told her I will relay the message to [place]. She asked to be taking off our call list.

## 2020-06-25 NOTE — Addendum Note (Signed)
Addended by: Murrell Redden D on: 06/25/2020 03:51 PM   Modules accepted: Orders
